# Patient Record
Sex: Male | Born: 1960 | Race: White | Hispanic: No | Marital: Single | Smoking: Former smoker
Health system: Southern US, Community
[De-identification: ages and names within clinical notes are randomized; demographics above are authoritative.]

## PROBLEM LIST (undated history)

## (undated) DIAGNOSIS — F419 Anxiety disorder, unspecified: Secondary | ICD-10-CM

## (undated) DIAGNOSIS — E119 Type 2 diabetes mellitus without complications: Secondary | ICD-10-CM

## (undated) DIAGNOSIS — Z5189 Encounter for other specified aftercare: Secondary | ICD-10-CM

## (undated) DIAGNOSIS — G4730 Sleep apnea, unspecified: Secondary | ICD-10-CM

## (undated) HISTORY — PX: CHOLECYSTECTOMY: SHX55

## (undated) HISTORY — PX: TONSILLECTOMY: SUR1361

## (undated) HISTORY — DX: Type 2 diabetes mellitus without complications: E11.9

## (undated) HISTORY — PX: TENDON REPAIR: SHX5111

## (undated) HISTORY — DX: Encounter for other specified aftercare: Z51.89

## (undated) HISTORY — PX: COLONOSCOPY: SHX174

---

## 2006-03-29 ENCOUNTER — Ambulatory Visit (HOSPITAL_COMMUNITY): Admission: RE | Admit: 2006-03-29 | Discharge: 2006-03-29 | Payer: Self-pay | Admitting: Family Medicine

## 2007-07-22 ENCOUNTER — Ambulatory Visit (HOSPITAL_COMMUNITY): Admission: RE | Admit: 2007-07-22 | Discharge: 2007-07-22 | Payer: Self-pay | Admitting: Family Medicine

## 2008-09-29 ENCOUNTER — Emergency Department (HOSPITAL_COMMUNITY): Admission: EM | Admit: 2008-09-29 | Discharge: 2008-09-29 | Payer: Self-pay | Admitting: Emergency Medicine

## 2008-10-09 ENCOUNTER — Encounter (INDEPENDENT_AMBULATORY_CARE_PROVIDER_SITE_OTHER): Payer: Self-pay | Admitting: General Surgery

## 2008-10-10 ENCOUNTER — Inpatient Hospital Stay (HOSPITAL_COMMUNITY): Admission: RE | Admit: 2008-10-10 | Discharge: 2008-10-11 | Payer: Self-pay | Admitting: General Surgery

## 2009-03-31 ENCOUNTER — Ambulatory Visit (HOSPITAL_COMMUNITY): Admission: RE | Admit: 2009-03-31 | Discharge: 2009-03-31 | Payer: Self-pay | Admitting: Family Medicine

## 2009-04-01 ENCOUNTER — Ambulatory Visit: Payer: Self-pay | Admitting: Internal Medicine

## 2009-04-04 DIAGNOSIS — R109 Unspecified abdominal pain: Secondary | ICD-10-CM | POA: Insufficient documentation

## 2009-04-04 DIAGNOSIS — K219 Gastro-esophageal reflux disease without esophagitis: Secondary | ICD-10-CM | POA: Insufficient documentation

## 2009-08-06 ENCOUNTER — Encounter (INDEPENDENT_AMBULATORY_CARE_PROVIDER_SITE_OTHER): Payer: Self-pay

## 2010-01-30 ENCOUNTER — Encounter: Payer: Self-pay | Admitting: Family Medicine

## 2010-02-08 NOTE — Letter (Signed)
Summary: Recall Office Visit  [facility]  [address]   [place], [place] [address]   Phone: [phone]  Fax: [phone]      August 06, 2009   Jimmy Mathis [address] [place], [place]  [address] [birth year]   Dear Mr. Andrepont,   According to our records, it is time for you to schedule a follow-up office visit with [place].   At your convenience, please call [phone] to schedule an office visit. If you have any questions, concerns, or feel that this letter is in error, we would appreciate your call.   Sincerely,    Hendricks Limes LPN  [facility] Ph: [phone]   Fax: [phone]

## 2010-02-08 NOTE — Assessment & Plan Note (Signed)
Summary: npp,abd pain,family hx colon ca. gu   Visit Type:  Initial Consult Primary Care Provider:  golding  Chief Complaint:  abd pain.  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 Symptoms began immediately following laparoscopic cholecystectomy for gangrenous cholecystitis back in October of 2010. He did have a Jackson-Pratt drain for period time and it was removed. He states the pain may "catch him" with time to time symptoms have a positional component;  he describes his pain primarily located to just below his rib cage. Not affected by eating or having a bowel movement. He tells me he's had normal bowel function denies constipation diarrhea he denies melena or hematochezia there's been no change in weight. He does not have any other GI symptoms such as odynapahagia, dysphagia, early satiety nausea or vomiting. He has not had any fever or chills.  He does have occasional reflux symptoms for which he takes omeprazole.  He does admit that his right-sided abdominal pain has  gradually improved considerably since the onset following cholecystectomy in October of last year. He feels at this point he should not be  having any  discomfort following cholecystectomy. He had a CT of the abdomen and pelvis done on March 23 which revealed benign appearing prostatic enlargement and a stable nonspecific  liver lesion but no other abnormalities.  Family history is significant for colon cancer in a maternal grandmother and 2 maternal uncles but at advanced advanced age.  Preventive Screening-Counseling & Management  Alcohol-Tobacco     Smoking Status: never  Current Medications (verified): 1)  Prilosec 20 Mg Cpdr (Omeprazole) .... Every Other Day 2)  Aspir-Low 81 Mg Tbec (Aspirin) .... Once Daily 3)  Multivitamins  Tabs (Multiple Vitamin) .... Once  Daily  Allergies (verified): 1)  ! Pcn 2)  ! Novocain  Past History:  Past Medical History: gerd  Past Surgical History: tendon repair in hand Cholecystectomy Tonsillectomy  Family History: Father: deceased Mother: alive Siblings: 1 sister Family History of Colon Cancer:maternal grandmother, 2 uncles on mothers side  Social History: Marital Status: no Children: no Occupation: Runner, broadcasting/film/video- [employer] Patient has never smoked.  Alcohol Use - yes occ Smoking Status:  never  Physical Exam  General:  or pleasant alert conversant gentleman in no acute distress Eyes:  no scleral icterus. Conjunctiva are pink Abdomen:  nondistended laparoscopy sites are well healed. Possibly a sounds soft very minimal vague tenderness to deep palpation right upper quadrant and right mid abdomen as well as right lower quadrant.  I do not appreciate any masses or hepatosplenomegaly  Impression & Recommendations:  Impression: Pleasant 50 year old gentleman with right-sided abdominal pain following laparoscopic cholecystectomy and  JP drain placement for gangrenous cholecystitis. The patient has made steady improvement since his gallbladder surgery. He admits that his symptoms are considerably improved at this time. Findings of abdominal pelvic CT scanning earlier this week are reassuring.  I suspect he may have had a little more abdominal wall injury during the laparoscopic procedure than is usually the case. I certainly do not detect any untoward consequences or complications related to his recent cholecystectomy.  He is approaching the threshold for colorectal cancer screening. He does have a history of colorectal cancer in  his family in only  second-degree relatives but at advanced age.  Recommendations: Reassurance that his right-sided abdominal pain is a postoperative phenomenon and is likely to continue to improve over  time without further evaluation. Since he is at the threshold age  for colorectal cancer screening, I will offer him a screening colonoscopy at this point in time. The patient would like to wait until he is officially turns 50 which will be this summer. I certainly do not feel this is unreasonable approach. However, I have asked him to return one immunofecal occult blood test sample so we can check his stool now. Of course, if that were to be positive, we will proceed with colonoscopy now.  I'd like to  thank Dr. Yetta Numbers for allowing me to see this nice gentleman.  Appended Document: Orders Update    Clinical Lists Changes  Problems: Added new problem of ABDOMINAL PAIN OTHER SPECIFIED SITE (ICD-789.09) Added new problem of GERD (ICD-530.81) Orders: Added new Service order of Consultation Level IV [phone]) - Signed

## 2010-04-14 LAB — DIFFERENTIAL
Lymphocytes Relative: 9 % — ABNORMAL LOW (ref 12–46)
Lymphs Abs: 1.4 10*3/uL (ref 0.7–4.0)
Monocytes Relative: 9 % (ref 3–12)
Neutro Abs: 11.8 10*3/uL — ABNORMAL HIGH (ref 1.7–7.7)

## 2010-04-14 LAB — CBC
MCV: 82.7 fL (ref 78.0–100.0)
RBC: 4.25 MIL/uL (ref 4.22–5.81)
RDW: 14 % (ref 11.5–15.5)
WBC: 14.5 10*3/uL — ABNORMAL HIGH (ref 4.0–10.5)

## 2010-04-14 LAB — HEPATIC FUNCTION PANEL
Alkaline Phosphatase: 72 U/L (ref 39–117)
Bilirubin, Direct: 0.2 mg/dL (ref 0.0–0.3)
Total Bilirubin: 0.8 mg/dL (ref 0.3–1.2)

## 2010-04-15 LAB — HEPATIC FUNCTION PANEL
AST: 23 U/L (ref 0–37)
Alkaline Phosphatase: 103 U/L (ref 39–117)
Bilirubin, Direct: 0.1 mg/dL (ref 0.0–0.3)
Total Bilirubin: 0.7 mg/dL (ref 0.3–1.2)
Total Protein: 7.3 g/dL (ref 6.0–8.3)

## 2010-04-15 LAB — URINALYSIS, ROUTINE W REFLEX MICROSCOPIC
Bilirubin Urine: NEGATIVE
Glucose, UA: NEGATIVE mg/dL
Ketones, ur: NEGATIVE mg/dL
Nitrite: NEGATIVE
Urobilinogen, UA: 0.2 mg/dL (ref 0.0–1.0)
pH: 8.5 — ABNORMAL HIGH (ref 5.0–8.0)

## 2010-04-15 LAB — BASIC METABOLIC PANEL
BUN: 10 mg/dL (ref 6–23)
Calcium: 10.1 mg/dL (ref 8.4–10.5)
Chloride: 105 mEq/L (ref 96–112)
Creatinine, Ser: 0.89 mg/dL (ref 0.4–1.5)
GFR calc Af Amer: >60 mL/min (ref >60)
GFR calc non Af Amer: >60 mL/min (ref >60)

## 2010-04-15 LAB — DIFFERENTIAL
Eosinophils Absolute: 0 10*3/uL (ref 0.0–0.7)
Lymphocytes Relative: 12 % (ref 12–46)
Neutro Abs: 6.9 10*3/uL (ref 1.7–7.7)

## 2010-04-15 LAB — CBC
MCHC: 35 g/dL (ref 30.0–36.0)
MCV: 81.5 fL (ref 78.0–100.0)
RBC: 5.43 MIL/uL (ref 4.22–5.81)
RDW: 13.8 % (ref 11.5–15.5)
WBC: 8.3 10*3/uL (ref 4.0–10.5)

## 2010-04-15 LAB — LIPASE, BLOOD: Lipase: 26 U/L (ref 11–59)

## 2010-06-13 ENCOUNTER — Telehealth: Payer: Self-pay

## 2010-06-13 DIAGNOSIS — Z800 Family history of malignant neoplasm of digestive organs: Secondary | ICD-10-CM

## 2010-06-13 DIAGNOSIS — Z139 Encounter for screening, unspecified: Secondary | ICD-10-CM

## 2010-06-13 NOTE — Telephone Encounter (Signed)
OK for colonscopy 

## 2010-06-13 NOTE — Telephone Encounter (Signed)
  Gastroenterology Pre-Procedure Form Request Date: 06/13/2010,  ( Pt called to schedule his own... PCP is Dr. Phillips Odor    PATIENT INFORMATION:  Jimmy Mathis is a 50 y.o., male (DOB=10-28-1960).  PROCEDURE: Procedure(s) requested: colonoscopy Procedure Reason: screening for colon cancer and family hx of colon cancer  PATIENT REVIEW QUESTIONS: The patient reports the following:   1. Diabetes Melitis: no 2. Joint replacements in the past 12 months: no 3. Major health problems in the past 3 months: no 4. Has an artificial valve or MVP:no 5. Has been advised in past to take antibiotics in advance of a procedure like teeth cleaning: no}    MEDICATIONS & ALLERGIES:    Patient reports the following regarding taking any blood thinners:   Plavix? No Aspirin? Yes  Coumadin?  no  Patient confirms/reports the following medications:  Current Outpatient Prescriptions  Medication Sig Dispense Refill  . aspirin 81 MG tablet Take 81 mg by mouth daily.        . Multiple Vitamin (MULTIVITAMIN) capsule Take 1 capsule by mouth daily.          Patient confirms/reports the following allergies:  Allergies  Allergen Reactions  . Penicillins Swelling  . Procaine Hcl Swelling    Patient is appropriate to schedule for requested procedure(s): yes  AUTHORIZATION INFORMATION Primary Insurance Pre-Cert / Auth required: Pre-Cert / Auth #:  Secondary Insurance: Pre-Cert / Auth required: Pre-Cert / Auth #:  Orders Placed This Encounter  Procedures  . Endoscopy, colon, diagnostic    Standing Status: Future     Number of Occurrences:      Standing Expiration Date: 06/13/2011    Order Specific Question:  Pre-op diagnosis    Answer:  Screening colonoscopy    Order Specific Question:  Pre-op visit required?    Answer:  No [0]    SCHEDULE INFORMATION: Procedure has been scheduled as follows:  Date: 06/20/2010, Time: 10:00 AM  Location: [facility] Short Stay  This Gastroenterology  Pre-Precedure Form is being routed to the following provider(s) for review: R. Roetta Sessions, MD

## 2010-06-14 NOTE — Telephone Encounter (Signed)
Rx and instructions faxed to [facility], pt aware.

## 2010-06-20 ENCOUNTER — Ambulatory Visit (HOSPITAL_COMMUNITY)
Admission: RE | Admit: 2010-06-20 | Discharge: 2010-06-20 | Disposition: A | Discharge status: Home / Self Care | Payer: BC Managed Care – PPO | Source: Ambulatory Visit | Attending: Internal Medicine | Admitting: Internal Medicine

## 2010-06-20 ENCOUNTER — Encounter: Payer: BC Managed Care – PPO | Admitting: Internal Medicine

## 2010-06-20 DIAGNOSIS — Z1211 Encounter for screening for malignant neoplasm of colon: Secondary | ICD-10-CM

## 2010-07-25 NOTE — Op Note (Signed)
  NAME:  Jimmy Mathis, Jimmy Mathis               ACCOUNT NO.:  [id number]  MEDICAL RECORD NO.:  [id number]  LOCATION:  [facility]                          FACILITY:  [facility]  PHYSICIAN:  R. Roetta Sessions, MD FACP FACGDATE OF BIRTH:  20-Sep-1960  DATE OF PROCEDURE: DATE OF DISCHARGE:                              OPERATIVE REPORT   PROCEDURE:  Screening ileocolonoscopy.  INDICATIONS FOR PROCEDURE:  A 50 year old gentleman, comes for his first ever screening colonoscopy.  He is devoid of any lower GI tract symptoms.  No family history of colon cancer in any first-degree relatives, although he has couple of second-degree relative colon cancer, but at advanced age.  Colonoscopy is now being done as standard screening maneuver.  Risks, benefits, limitations, alternatives, imponderables have been discussed, questions answered.  Please see the documentation in the medical record for more information.  PROCEDURE NOTE:  O2 saturation, blood pressure, pulse, respirations were monitored throughout the entire procedure.  CONSCIOUS SEDATION:  Versed 5 mg IV, Demerol 100 mg IV in divided doses.  INSTRUMENT:  Pentax video chip system.  FINDINGS:  Digital rectal exam revealed no abnormalities.  Endoscopic findings:  Prep was good.  Colon:  Colonic mucosa was surveyed from the rectosigmoid junction through the left transverse right colon to the appendiceal orifice, ileocecal valve/cecum.  These structures were well seen and photographed for the record.  Terminal ileum was intubated to 10 cm.  From this level, the scope was slowly and cautiously withdrawn. All previously mentioned mucosal surfaces were again seen.  The colonic mucosa as well as the terminal mucosa appeared entirely normal.  Scope was pulled down to the rectum where a thorough examination of rectal mucosa including retroflexed view of the anal verge demonstrated no abnormalities.  The patient tolerated the procedure well.  Cecal withdrawal time 8  minutes.  IMPRESSION:  Normal rectum, colon, and terminal ileum.  RECOMMENDATIONS:  Repeat screening colonoscopy in 10 years.     Jonathon Bellows, MD [facility]     RMR/MEDQ  D:  06/20/2010  T:  06/21/2010  Job:  [id number]  cc:   Dr. Phillips Odor  Electronically Signed by Lorrin Goodell M.D. on 07/25/2010 08:39:48 AM

## 2012-05-22 ENCOUNTER — Emergency Department (HOSPITAL_COMMUNITY): Payer: BC Managed Care – PPO

## 2012-05-22 ENCOUNTER — Emergency Department (HOSPITAL_COMMUNITY)
Admission: EM | Admit: 2012-05-22 | Discharge: 2012-05-22 | Disposition: A | Discharge status: Home / Self Care | Payer: BC Managed Care – PPO | Attending: Emergency Medicine | Admitting: Emergency Medicine

## 2012-05-22 ENCOUNTER — Encounter (HOSPITAL_COMMUNITY): Payer: Self-pay | Admitting: Emergency Medicine

## 2012-05-22 DIAGNOSIS — R079 Chest pain, unspecified: Secondary | ICD-10-CM | POA: Insufficient documentation

## 2012-05-22 DIAGNOSIS — E781 Pure hyperglyceridemia: Secondary | ICD-10-CM | POA: Insufficient documentation

## 2012-05-22 DIAGNOSIS — Z7982 Long term (current) use of aspirin: Secondary | ICD-10-CM | POA: Insufficient documentation

## 2012-05-22 DIAGNOSIS — F411 Generalized anxiety disorder: Secondary | ICD-10-CM | POA: Insufficient documentation

## 2012-05-22 DIAGNOSIS — Z880 Allergy status to penicillin: Secondary | ICD-10-CM | POA: Insufficient documentation

## 2012-05-22 DIAGNOSIS — Z79899 Other long term (current) drug therapy: Secondary | ICD-10-CM | POA: Insufficient documentation

## 2012-05-22 DIAGNOSIS — R42 Dizziness and giddiness: Secondary | ICD-10-CM | POA: Insufficient documentation

## 2012-05-22 LAB — CBC WITH DIFFERENTIAL/PLATELET
Basophils Absolute: 0 10*3/uL (ref 0.0–0.1)
Basophils Relative: 1 % (ref 0–1)
Eosinophils Absolute: 0 10*3/uL (ref 0.0–0.7)
HCT: 44.2 % (ref 39.0–52.0)
MCHC: 36.7 g/dL — ABNORMAL HIGH (ref 30.0–36.0)
Monocytes Absolute: 0.7 10*3/uL (ref 0.1–1.0)
Monocytes Relative: 9 % (ref 3–12)
Neutro Abs: 5.6 10*3/uL (ref 1.7–7.7)
Platelets: 187 10*3/uL (ref 150–400)
RBC: 5.65 MIL/uL (ref 4.22–5.81)

## 2012-05-22 LAB — BASIC METABOLIC PANEL
BUN: 10 mg/dL (ref 6–23)
Calcium: 9.5 mg/dL (ref 8.4–10.5)
Glucose, Bld: 146 mg/dL — ABNORMAL HIGH (ref 70–99)
Potassium: 4.6 mEq/L (ref 3.5–5.1)

## 2012-05-22 LAB — TROPONIN I: Troponin I: <0.3 ng/mL (ref <0.30)

## 2012-05-22 LAB — HEPATIC FUNCTION PANEL
Albumin: 3.9 g/dL (ref 3.5–5.2)
Total Protein: 6.9 g/dL (ref 6.0–8.3)

## 2012-05-22 MED ORDER — PANTOPRAZOLE SODIUM 40 MG PO TBEC
40.0000 mg | DELAYED_RELEASE_TABLET | Freq: Every day | ORAL | Status: DC
  Administered 2012-05-22: 40 mg via ORAL
  Filled 2012-05-22: qty 1

## 2012-05-22 MED ORDER — FAMOTIDINE 20 MG PO TABS
20.0000 mg | ORAL_TABLET | Freq: Once | ORAL | Status: AC
  Administered 2012-05-22: 20 mg via ORAL
  Filled 2012-05-22: qty 1

## 2012-05-22 MED ORDER — RANITIDINE HCL 150 MG PO TABS: 150.0000 mg | ORAL_TABLET | Freq: Two times a day (BID) | ORAL | Status: DC

## 2012-05-22 MED ORDER — OMEPRAZOLE 20 MG PO CPDR: 20.0000 mg | DELAYED_RELEASE_CAPSULE | Freq: Every day | ORAL | Status: DC

## 2012-05-22 NOTE — ED Notes (Signed)
Pt reports chest pain that started around 5am. Pt alert and oriented. Pt has received 324 mg baby aspirin en route. No diaphoresis noted. nad noted.

## 2012-05-22 NOTE — ED Notes (Signed)
Pt urine placed in blue bin at nurses station.

## 2012-05-22 NOTE — ED Provider Notes (Signed)
History     CSN: [id number]  Arrival date & time 05/22/12  4098   First MD Initiated Contact with Patient 05/22/12 [phone]      Chief Complaint  Patient presents with  . Chest Pain    (Consider location/radiation/quality/duration/timing/severity/associated sxs/prior treatment) Patient is a 52 y.o. male presenting with chest pain. The history is provided by the patient.  Chest Pain Pain location:  Substernal area Pain quality: pressure   Pain radiates to:  Does not radiate Pain radiates to the back: no   Pain severity:  Moderate Onset quality:  Gradual Duration:  3 hours Timing:  Constant Progression:  Improving Chronicity:  Recurrent Context: not breathing and not lifting   Context comment:  Dizziness Relieved by:  Antacids Exacerbated by: food. Ineffective treatments:  None tried Associated symptoms: anxiety and dizziness   Associated symptoms: no abdominal pain, no back pain, no cough, no nausea, no near-syncope, no palpitations and no shortness of breath   Risk factors: male sex   Risk factors: no coronary artery disease, no diabetes mellitus, no high cholesterol, no hypertension, no prior DVT/PE, no smoking and no surgery   Risk factors comment:  Triglycerides elevated.   History reviewed. No pertinent past medical history.  Past Surgical History  Procedure Laterality Date  . Tonsillectomy    . Cholecystectomy    . Tendon repair      right hand    History reviewed. No pertinent family history.  History  Substance Use Topics  . Smoking status: Never Smoker   . Smokeless tobacco: Not on file  . Alcohol Use: No     Comment: occasionally      Review of Systems  Constitutional: Negative for activity change.       All ROS Neg except as noted in HPI  HENT: Negative for nosebleeds and neck pain.   Eyes: Negative for photophobia and discharge.  Respiratory: Negative for cough, shortness of breath and wheezing.   Cardiovascular: Positive for chest pain.  Negative for palpitations and near-syncope.  Gastrointestinal: Negative for nausea, abdominal pain and blood in stool.  Genitourinary: Negative for dysuria, frequency and hematuria.  Musculoskeletal: Negative for back pain and arthralgias.  Skin: Negative.   Neurological: Positive for dizziness. Negative for seizures and speech difficulty.  Psychiatric/Behavioral: Negative for hallucinations and confusion.    Allergies  Penicillins and Procaine hcl  Home Medications   Current Outpatient Rx  Name  Route  Sig  Dispense  Refill  . aspirin 81 MG tablet   Oral   Take 81 mg by mouth daily.           Marland Kitchen ibuprofen (ADVIL,MOTRIN) 200 MG tablet   Oral   Take 400 mg by mouth every 6 (six) hours as needed for pain or headache.         . Multiple Vitamin (MULTIVITAMIN) capsule   Oral   Take 1 capsule by mouth daily.             There were no vitals taken for this visit.  Physical Exam  Nursing note and vitals reviewed. Constitutional: He is oriented to person, place, and time. He appears well-developed and well-nourished.  Non-toxic appearance.  HENT:  Head: Normocephalic.  Right Ear: Tympanic membrane and external ear normal.  Left Ear: Tympanic membrane and external ear normal.  Eyes: EOM and lids are normal. Pupils are equal, round, and reactive to light.  Neck: Normal range of motion. Neck supple. Carotid bruit is not present.  Cardiovascular:  Normal rate, regular rhythm, normal heart sounds, intact distal pulses and normal pulses.   Pulmonary/Chest: Breath sounds normal. No respiratory distress.  Abdominal: Soft. Bowel sounds are normal. There is no tenderness. There is no guarding.  Musculoskeletal: Normal range of motion.  Lymphadenopathy:       Head (right side): No submandibular adenopathy present.       Head (left side): No submandibular adenopathy present.    He has no cervical adenopathy.  Neurological: He is alert and oriented to person, place, and time. He has  normal strength. No cranial nerve deficit or sensory deficit.  Skin: Skin is warm and dry.  Psychiatric: He has a normal mood and affect. His speech is normal.    ED Course  Procedures (including critical care time)  Labs Reviewed  GLUCOSE, CAPILLARY - Abnormal; Notable for the following:    Glucose-Capillary 160 (*)    All other components within normal limits  CBC WITH DIFFERENTIAL  BASIC METABOLIC PANEL  TROPONIN I   No results found.  Date: 05/22/2012  Rate: 90  Rhythm: normal sinus rhythm  QRS Axis: normal  Intervals: normal  ST/T Wave abnormalities: normal  Conduction Disutrbances:none  Narrative Interpretation: No STEMI  Old EKG Reviewed: Improved from  Sept 29,2010.   No diagnosis found.    MDM  I have reviewed nursing notes, vital signs, and all appropriate lab and imaging results for this patient. Pt noted a substernal area pressure type pain that has been constant for nearly 3 or more hours. Pt became dizzy and somewhat pale (according to co-workers) and was sent to ED.  EKG reveals NSR with no STEMI. Troponin normal at <0.30, Lipase wnl. CBC and Bmet wnl except for glucose 146. Chest xray is non-acute.  Pt observed on the monitor with no acute rhythm changes or problem. 2nd Troponin wnl. Pt advised of ED work up findings. Advised to use zantac and prilosec. He is to see his PCP tomorrow for completion of cardiac work up. He will return to the ED if any changes or problem.    Kathie Dike, PA-C 05/24/12 [phone]

## 2012-05-22 NOTE — ED Notes (Signed)
DG chest 2 view ordered in error. Could not  d/c order in order management. Order clicked off in error. Order changed to portable 1 view per PA verbal order.

## 2012-05-25 NOTE — ED Provider Notes (Signed)
Medical screening examination/treatment/procedure(s) were performed by non-physician practitioner and as supervising physician I was immediately available for consultation/collaboration.   Laray Anger, DO 05/25/12 1054

## 2012-09-21 ENCOUNTER — Emergency Department (HOSPITAL_COMMUNITY): Payer: BC Managed Care – PPO

## 2012-09-21 ENCOUNTER — Emergency Department (HOSPITAL_COMMUNITY)
Admission: EM | Admit: 2012-09-21 | Discharge: 2012-09-21 | Disposition: A | Discharge status: Home / Self Care | Payer: BC Managed Care – PPO | Attending: Emergency Medicine | Admitting: Emergency Medicine

## 2012-09-21 ENCOUNTER — Encounter (HOSPITAL_COMMUNITY): Payer: Self-pay

## 2012-09-21 DIAGNOSIS — Z880 Allergy status to penicillin: Secondary | ICD-10-CM | POA: Insufficient documentation

## 2012-09-21 DIAGNOSIS — R0989 Other specified symptoms and signs involving the circulatory and respiratory systems: Secondary | ICD-10-CM | POA: Insufficient documentation

## 2012-09-21 DIAGNOSIS — I493 Ventricular premature depolarization: Secondary | ICD-10-CM

## 2012-09-21 DIAGNOSIS — Z8639 Personal history of other endocrine, nutritional and metabolic disease: Secondary | ICD-10-CM | POA: Insufficient documentation

## 2012-09-21 DIAGNOSIS — Z862 Personal history of diseases of the blood and blood-forming organs and certain disorders involving the immune mechanism: Secondary | ICD-10-CM | POA: Insufficient documentation

## 2012-09-21 DIAGNOSIS — Z79899 Other long term (current) drug therapy: Secondary | ICD-10-CM | POA: Insufficient documentation

## 2012-09-21 DIAGNOSIS — R079 Chest pain, unspecified: Secondary | ICD-10-CM | POA: Insufficient documentation

## 2012-09-21 DIAGNOSIS — I491 Atrial premature depolarization: Secondary | ICD-10-CM | POA: Insufficient documentation

## 2012-09-21 DIAGNOSIS — R0609 Other forms of dyspnea: Secondary | ICD-10-CM | POA: Insufficient documentation

## 2012-09-21 DIAGNOSIS — Z7982 Long term (current) use of aspirin: Secondary | ICD-10-CM | POA: Insufficient documentation

## 2012-09-21 LAB — BASIC METABOLIC PANEL
BUN: 8 mg/dL (ref 6–23)
CO2: 26 mEq/L (ref 19–32)
Chloride: 104 mEq/L (ref 96–112)
Creatinine, Ser: 0.98 mg/dL (ref 0.50–1.35)

## 2012-09-21 LAB — CBC WITH DIFFERENTIAL/PLATELET
Basophils Absolute: 0 10*3/uL (ref 0.0–0.1)
HCT: 43.5 % (ref 39.0–52.0)
Hemoglobin: 15.3 g/dL (ref 13.0–17.0)
Lymphocytes Relative: 23 % (ref 12–46)
Lymphs Abs: 1.4 10*3/uL (ref 0.7–4.0)
Monocytes Absolute: 0.6 10*3/uL (ref 0.1–1.0)
Monocytes Relative: 10 % (ref 3–12)
Neutro Abs: 4 10*3/uL (ref 1.7–7.7)
WBC: 6.2 10*3/uL (ref 4.0–10.5)

## 2012-09-21 LAB — TROPONIN I: Troponin I: <0.3 ng/mL (ref <0.30)

## 2012-09-21 MED ORDER — NITROGLYCERIN 0.4 MG SL SUBL: 0.4000 mg | SUBLINGUAL_TABLET | SUBLINGUAL | Status: DC | PRN

## 2012-09-21 MED ORDER — NITROGLYCERIN 0.4 MG SL SUBL
0.4000 mg | SUBLINGUAL_TABLET | SUBLINGUAL | Status: DC | PRN
  Administered 2012-09-21 (×2): 0.4 mg via SUBLINGUAL
  Filled 2012-09-21: qty 25

## 2012-09-21 MED ORDER — ASPIRIN 81 MG PO CHEW
324.0000 mg | CHEWABLE_TABLET | Freq: Once | ORAL | Status: AC
  Administered 2012-09-21: 324 mg via ORAL
  Filled 2012-09-21: qty 4

## 2012-09-21 NOTE — ED Notes (Signed)
Pt awoke approx 4 am to go to the bathroom and felt a "thumping" in his chest. Pt denies pain or discomfort in chest but states his back feels tight

## 2012-09-21 NOTE — ED Provider Notes (Signed)
CSN: [id number]     Arrival date & time 09/21/12  1191 History   First MD Initiated Contact with Patient 09/21/12 [phone]     Chief Complaint  Patient presents with  . Palpitations   (Consider location/radiation/quality/duration/timing/severity/associated sxs/prior Treatment) Patient is a 52 y.o. male presenting with palpitations. The history is provided by the patient.  Palpitations He had onset at about 3:45 AM of palpitations like his heart was racing and thumping in his chest. There was some associated tightness in his chest which has resolved but there is some residual tightness in his back. Tightness is moderate and he rated at 6/10 at its worst and is down to 3/10. There is some dyspnea but no nausea, vomiting, diaphoresis. Nothing made it better nothing made it worse. He does have a history of one episode of paroxysmal atrial fibrillation which occurred about 20 years ago. His cardiac risk factors include hypertriglyceridemia, and positive family history with his father having heart disease with onset in his mid 67s. He didn't takes aspirin 81 mg daily. He denies history of hypertension, diabetes and he is a nonsmoker. Of note, he had been seen in the ED for months ago for an episode of chest pain but had not followed up with his PCP. He does have an appointment with his PCP next month.  History reviewed. No pertinent past medical history. Past Surgical History  Procedure Laterality Date  . Tonsillectomy    . Cholecystectomy    . Tendon repair      right hand   No family history on file. History  Substance Use Topics  . Smoking status: Never Smoker   . Smokeless tobacco: Not on file  . Alcohol Use: Yes     Comment: occasionally    Review of Systems  Cardiovascular: Positive for palpitations.  All other systems reviewed and are negative.    Allergies  Penicillins and Procaine hcl  Home Medications   Current Outpatient Rx  Name  Route  Sig  Dispense  Refill  . aspirin 81  MG tablet   Oral   Take 81 mg by mouth daily.           Marland Kitchen ibuprofen (ADVIL,MOTRIN) 200 MG tablet   Oral   Take 400 mg by mouth every 6 (six) hours as needed for pain or headache.         . Multiple Vitamin (MULTIVITAMIN) capsule   Oral   Take 1 capsule by mouth daily.           Marland Kitchen omeprazole (PRILOSEC) 20 MG capsule   Oral   Take 1 capsule (20 mg total) by mouth daily.   30 capsule   0   . ranitidine (ZANTAC) 150 MG tablet   Oral   Take 1 tablet (150 mg total) by mouth 2 (two) times daily.   60 tablet   0    BP 154/75  Pulse 101  Temp(Src) 98.4 F (36.9 C) (Oral)  Resp 20  Ht 5\' 10"  (1.778 m)  Wt 235 lb (106.595 kg)  BMI 33.72 kg/m2  SpO2 98% Physical Exam  Nursing note and vitals reviewed.  52 year old male, resting comfortably and in no acute distress. Vital signs are significant for hypertension with blood pressure 154/75, and tachycardia with heart rate 101. Oxygen saturation is 98%, which is normal. Head is normocephalic and atraumatic. PERRLA, EOMI. Oropharynx is clear. Neck is nontender and supple without adenopathy or JVD. Back is nontender and there is no CVA  tenderness. Lungs are clear without rales, wheezes, or rhonchi. Chest is nontender. Heart has regular rate and rhythm without murmur. Abdomen is soft, flat, nontender without masses or hepatosplenomegaly and peristalsis is normoactive. Extremities have trace edema, full range of motion is present. Skin is warm and dry without rash. Neurologic: Mental status is normal, cranial nerves are intact, there are no motor or sensory deficits.  ED Course  Procedures (including critical care time) Labs Review Results for orders placed during the hospital encounter of 09/21/12  CBC WITH DIFFERENTIAL      Result Value Range   WBC 6.2  4.0 - 10.5 K/uL   RBC 5.33  4.22 - 5.81 MIL/uL   Hemoglobin 15.3  13.0 - 17.0 g/dL   HCT 16.1  09.6 - 04.5 %   MCV 81.6  78.0 - 100.0 fL   MCH 28.7  26.0 - 34.0 pg    MCHC 35.2  30.0 - 36.0 g/dL   RDW 40.9  81.1 - 91.4 %   Platelets 171  150 - 400 K/uL   Neutrophils Relative % 65  43 - 77 %   Neutro Abs 4.0  1.7 - 7.7 K/uL   Lymphocytes Relative 23  12 - 46 %   Lymphs Abs 1.4  0.7 - 4.0 K/uL   Monocytes Relative 10  3 - 12 %   Monocytes Absolute 0.6  0.1 - 1.0 K/uL   Eosinophils Relative 2  0 - 5 %   Eosinophils Absolute 0.1  0.0 - 0.7 K/uL   Basophils Relative 0  0 - 1 %   Basophils Absolute 0.0  0.0 - 0.1 K/uL  BASIC METABOLIC PANEL      Result Value Range   Sodium 139  135 - 145 mEq/L   Potassium 3.6  3.5 - 5.1 mEq/L   Chloride 104  96 - 112 mEq/L   CO2 26  19 - 32 mEq/L   Glucose, Bld 160 (*) 70 - 99 mg/dL   BUN 8  6 - 23 mg/dL   Creatinine, Ser 7.82  0.50 - 1.35 mg/dL   Calcium 95.6  8.4 - 21.3 mg/dL   GFR calc non Af Amer >90  >90 mL/min   GFR calc Af Amer >90  >90 mL/min  TROPONIN I      Result Value Range   Troponin I <0.30  <0.30 ng/mL   Imaging Review Dg Chest Port 1 View  09/21/2012   *RADIOLOGY REPORT*  Clinical Data: Chest pain  PORTABLE CHEST - 1 VIEW  Comparison: Prior radiograph from 05/22/2012  Findings: Cardiac and mediastinal silhouettes are stable in size and contour.  The lung volumes are within normal limits.  Mild elevation right hemidiaphragm is unchanged.  No airspace consolidation, pleural effusion, or pulmonary edema is identified.  There is no pneumothorax.  No acute osseous abnormality identified.  IMPRESSION: No acute cardiopulmonary process.   Original Report Authenticated By: Rise Mu, M.D.     Date: 09/21/2012  Rate: 108  Rhythm: sinus tachycardia and premature ventricular contractions (PVC)  QRS Axis: normal  Intervals: normal  ST/T Wave abnormalities: normal  Conduction Disutrbances:Incomplete right branch block  Narrative Interpretation: Sinus tachycardia, incomplete right bundle-branch block, frequent PVCs. When compared with ECG of May 22 2012, PVCs are now present but no other changes are  seen.  Old EKG Reviewed: unchanged   MDM   1. Chest pain   2. PVC's (premature ventricular contractions)    Episode of palpitations and chest tightness in  patient with modest risk factors for coronary artery disease. He is given aspirin and nitroglycerin and laboratory workup initiated. He clearly needs some kind of provocative stress testing. Old records are reviewed and he does have an ED visit for chest pain.  6:08 AM He had good relief of his tightness with nitroglycerin but continues to feel palpitations. He continues to have frequent PVCs on monitor. Patient is very worried about this. He will be kept in the ED to get followup troponin.  7:43 AM He continues to be free of the chest tightness. PVCs continue on the monitor and is still aware of them. Repeat troponin is pending. If this is negative, he will be referred to cardiology for outpatient stress testing and discharged with a prescription for nitroglycerin.  Dione Booze, MD 09/21/12 [phone]

## 2012-09-21 NOTE — ED Notes (Signed)
Pt will have enzymes rechecked 3 hours after receiving last nitro.

## 2012-10-22 ENCOUNTER — Emergency Department (HOSPITAL_COMMUNITY)
Admission: EM | Admit: 2012-10-22 | Discharge: 2012-10-22 | Disposition: A | Discharge status: Home / Self Care | Payer: BC Managed Care – PPO | Attending: Emergency Medicine | Admitting: Emergency Medicine

## 2012-10-22 ENCOUNTER — Ambulatory Visit (HOSPITAL_COMMUNITY)
Admission: RE | Admit: 2012-10-22 | Discharge: 2012-10-22 | Disposition: A | Discharge status: Home / Self Care | Payer: BC Managed Care – PPO | Source: Ambulatory Visit | Attending: Family Medicine | Admitting: Family Medicine

## 2012-10-22 ENCOUNTER — Other Ambulatory Visit (HOSPITAL_COMMUNITY): Payer: Self-pay | Admitting: Family Medicine

## 2012-10-22 ENCOUNTER — Encounter (HOSPITAL_COMMUNITY): Payer: Self-pay | Admitting: Emergency Medicine

## 2012-10-22 ENCOUNTER — Emergency Department (HOSPITAL_COMMUNITY): Payer: BC Managed Care – PPO

## 2012-10-22 DIAGNOSIS — J8410 Pulmonary fibrosis, unspecified: Secondary | ICD-10-CM | POA: Insufficient documentation

## 2012-10-22 DIAGNOSIS — J690 Pneumonitis due to inhalation of food and vomit: Secondary | ICD-10-CM | POA: Insufficient documentation

## 2012-10-22 DIAGNOSIS — R059 Cough, unspecified: Secondary | ICD-10-CM | POA: Insufficient documentation

## 2012-10-22 DIAGNOSIS — J189 Pneumonia, unspecified organism: Secondary | ICD-10-CM

## 2012-10-22 DIAGNOSIS — Z880 Allergy status to penicillin: Secondary | ICD-10-CM | POA: Insufficient documentation

## 2012-10-22 DIAGNOSIS — Z0000 Encounter for general adult medical examination without abnormal findings: Secondary | ICD-10-CM

## 2012-10-22 DIAGNOSIS — Z7982 Long term (current) use of aspirin: Secondary | ICD-10-CM | POA: Insufficient documentation

## 2012-10-22 DIAGNOSIS — R05 Cough: Secondary | ICD-10-CM | POA: Insufficient documentation

## 2012-10-22 DIAGNOSIS — R509 Fever, unspecified: Secondary | ICD-10-CM | POA: Insufficient documentation

## 2012-10-22 DIAGNOSIS — R918 Other nonspecific abnormal finding of lung field: Secondary | ICD-10-CM | POA: Insufficient documentation

## 2012-10-22 LAB — BASIC METABOLIC PANEL
BUN: 10 mg/dL (ref 6–23)
CO2: 25 mEq/L (ref 19–32)
Chloride: 99 mEq/L (ref 96–112)
GFR calc non Af Amer: >90 mL/min (ref >90)
Glucose, Bld: 114 mg/dL — ABNORMAL HIGH (ref 70–99)
Potassium: 3.8 mEq/L (ref 3.5–5.1)

## 2012-10-22 LAB — CBC WITH DIFFERENTIAL/PLATELET
Eosinophils Absolute: 0 10*3/uL (ref 0.0–0.7)
Hemoglobin: 15.5 g/dL (ref 13.0–17.0)
Lymphs Abs: 1.3 10*3/uL (ref 0.7–4.0)
MCH: 28.7 pg (ref 26.0–34.0)
Monocytes Relative: 9 % (ref 3–12)
Neutrophils Relative %: 82 % — ABNORMAL HIGH (ref 43–77)
RBC: 5.41 MIL/uL (ref 4.22–5.81)

## 2012-10-22 MED ORDER — AZITHROMYCIN 250 MG PO TABS: 250.0000 mg | ORAL_TABLET | Freq: Every day | ORAL | Status: DC

## 2012-10-22 MED ORDER — AZITHROMYCIN 250 MG PO TABS
500.0000 mg | ORAL_TABLET | Freq: Once | ORAL | Status: AC
  Administered 2012-10-22: 500 mg via ORAL
  Filled 2012-10-22: qty 2

## 2012-10-22 MED ORDER — IOHEXOL 300 MG/ML  SOLN
80.0000 mL | Freq: Once | INTRAMUSCULAR | Status: AC | PRN
  Administered 2012-10-22: 80 mL via INTRAVENOUS

## 2012-10-22 NOTE — ED Notes (Signed)
Allowed pt to ventilate and ask questions before leaving. Pt will f/u w/PMD tomorrow.

## 2012-10-22 NOTE — ED Notes (Signed)
Cough for  2 mos, Had a chest x-ray. Today, that pt says was normal.  Coughed up a fb  That looks like a popcorn kernal. Today.

## 2012-10-22 NOTE — ED Provider Notes (Signed)
CSN: [id number]     Arrival date & time 10/22/12  1744 History   First MD Initiated Contact with Patient 10/22/12 1958     Chief Complaint  Patient presents with  . Cough   (Consider location/radiation/quality/duration/timing/severity/associated sxs/prior Treatment) Patient is a 52 y.o. male presenting with cough.  Cough  Pt with no significant PMH reports he has had dry cough for the past 2 months, worse at night, associated with subjective fevers at night as well. Not associated with CP or SOB. He went to see his PCP today and had CXR done this AM which he was told was 'normal'. Later this afternoon, he coughed up what appears to be an unpopped kernel of popcorn (he brought it with him). He has since begun to cough up a large amount of yellow sputum.   History reviewed. No pertinent past medical history. Past Surgical History  Procedure Laterality Date  . Tonsillectomy    . Cholecystectomy    . Tendon repair      right hand   History reviewed. No pertinent family history. History  Substance Use Topics  . Smoking status: Never Smoker   . Smokeless tobacco: Not on file  . Alcohol Use: Yes     Comment: occasionally    Review of Systems  Respiratory: Positive for cough.    All other systems reviewed and are negative except as noted in HPI.   Allergies  Penicillins; Procaine hcl; and Levaquin  Home Medications   Current Outpatient Rx  Name  Route  Sig  Dispense  Refill  . acetaminophen (TYLENOL) 500 MG tablet   Oral   Take 500 mg by mouth every 6 (six) hours as needed for pain.         Marland Kitchen aspirin 81 MG tablet   Oral   Take 81 mg by mouth daily.           . Multiple Vitamin (MULTIVITAMIN) capsule   Oral   Take 1 capsule by mouth daily.           . nitroGLYCERIN (NITROSTAT) 0.4 MG SL tablet   Sublingual   Place 1 tablet (0.4 mg total) under the tongue every 5 (five) minutes as needed for chest pain.   30 tablet   0    BP 145/76  Pulse 126  Temp(Src)  99.8 F (37.7 C) (Oral)  Resp 20  Ht 5\' 10"  (1.778 m)  Wt 233 lb (105.688 kg)  BMI 33.43 kg/m2  SpO2 98% Physical Exam  Nursing note and vitals reviewed. Constitutional: He is oriented to person, place, and time. He appears well-developed and well-nourished.  HENT:  Head: Normocephalic and atraumatic.  Eyes: EOM are normal. Pupils are equal, round, and reactive to light.  Neck: Normal range of motion. Neck supple.  Cardiovascular: Normal rate, normal heart sounds and intact distal pulses.   Pulmonary/Chest: Effort normal and breath sounds normal.  Abdominal: Bowel sounds are normal. He exhibits no distension. There is no tenderness.  Musculoskeletal: Normal range of motion. He exhibits no edema and no tenderness.  Neurological: He is alert and oriented to person, place, and time. He has normal strength. No cranial nerve deficit or sensory deficit.  Skin: Skin is warm and dry. No rash noted.  Psychiatric: He has a normal mood and affect.    ED Course  Procedures (including critical care time) Labs Review Labs Reviewed  CBC WITH DIFFERENTIAL - Abnormal; Notable for the following:    WBC 13.8 (*)  Neutrophils Relative % 82 (*)    Neutro Abs 11.3 (*)    Lymphocytes Relative 9 (*)    Monocytes Absolute 1.2 (*)    All other components within normal limits  BASIC METABOLIC PANEL - Abnormal; Notable for the following:    Glucose, Bld 114 (*)    All other components within normal limits   Imaging Review Dg Chest 2 View  10/22/2012   CLINICAL DATA:  Cough for several months with a on and off fevers.  EXAM: CHEST  2 VIEW  COMPARISON:  09/21/2012  FINDINGS: Opacity projects at the right lung base mildly increased from the prior exam. The lungs are otherwise clear. No pleural effusion or pneumothorax.  The cardiac silhouette is normal in size and configuration. The mediastinum is normal in contour.  The bony thorax is intact.  IMPRESSION: 1. Mild opacity at the right lung base, some of  which lies in the right middle lobe, with additional opacity most likely in the right lower lobe. Although this was present on the most recent prior study, it is more prominent. Given the symptoms, a followup chest CT with contrast is recommended.   Electronically Signed   By: Amie Portland M.D.   On: 10/22/2012 11:47   Ct Chest W Contrast  10/22/2012   CLINICAL DATA:  Chronic cough. Patient coughed up a hard small object today. Marland Kitchen  EXAM: CT CHEST WITH CONTRAST  TECHNIQUE: Multidetector CT imaging of the chest was performed during intravenous contrast administration.  CONTRAST:  80mL OMNIPAQUE IOHEXOL 300 MG/ML  SOLN  COMPARISON:  Multiple prior chest x-rays, most recent 10/22/2012  FINDINGS: Right middle lobe atelectasis and postobstructive infiltrate. Inflammatory change surrounding the right middle lobe bronchus with granulomatous change. Consider bronchoscopy for further evaluation; the findings are likely inflammatory in nature, but I am unable to exclude underlying neoplasm. 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 Similar changes in the subcarinal region. No significant left hilar granulomatous change. Left lung clear.  Normal heart size. No pleural or pericardial effusion. No coronary artery calcification. No noncalcified pulmonary nodules. Normal esophagus. Trachea midline. Normal arch and great vessels with only minimal transverse arch atheromatous change. Negative osseous structures. Extra thoracic soft tissues within normal limits.  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 Consider bronchoscopy for further evaluation giving the relatively long standing duration of symptoms. See discussion above   Electronically Signed   By: Davonna Belling M.D.   On: 10/22/2012 21:27    EKG Interpretation   None       MDM   1. Post-obstructive pneumonia due to foreign body aspiration   2. Calcified  granuloma of lung     CXR results from earlier today were NOT NORMAL despite what the patient was told. On my review of the images there is a high density area near the apex of the triangular opacity, could be location of coughed up popcorn kernel but could be normal vasculature. Similar findings seen on pCXR done about a month ago here although he was coughing then too. Will send for CT to eval these findings and to ensure no other underlying abnormality.   9:39 PM CT images reviewed. Will treat for CAP. Pt does not tolerate Levaquin. Will start Zithromax. Close PCP and Pulmonology followup for recheck. Hard object sent to Pathology for review.     Charles B. Bernette Mayers, MD 10/22/12 2141

## 2012-10-28 ENCOUNTER — Telehealth (HOSPITAL_COMMUNITY): Payer: Self-pay | Admitting: Emergency Medicine

## 2012-10-29 ENCOUNTER — Institutional Professional Consult (permissible substitution): Payer: BC Managed Care – PPO | Admitting: Internal Medicine

## 2012-10-30 DIAGNOSIS — R9389 Abnormal findings on diagnostic imaging of other specified body structures: Secondary | ICD-10-CM | POA: Insufficient documentation

## 2012-10-31 ENCOUNTER — Institutional Professional Consult (permissible substitution): Payer: BC Managed Care – PPO | Admitting: Internal Medicine

## 2012-11-12 DIAGNOSIS — T17808A Unspecified foreign body in other parts of respiratory tract causing other injury, initial encounter: Secondary | ICD-10-CM | POA: Insufficient documentation

## 2012-11-12 DIAGNOSIS — T783XXA Angioneurotic edema, initial encounter: Secondary | ICD-10-CM | POA: Insufficient documentation

## 2012-11-12 DIAGNOSIS — Z889 Allergy status to unspecified drugs, medicaments and biological substances status: Secondary | ICD-10-CM | POA: Insufficient documentation

## 2012-11-12 DIAGNOSIS — T360X5A Adverse effect of penicillins, initial encounter: Secondary | ICD-10-CM | POA: Insufficient documentation

## 2012-11-14 ENCOUNTER — Other Ambulatory Visit: Payer: Self-pay

## 2013-08-13 DIAGNOSIS — R972 Elevated prostate specific antigen [PSA]: Secondary | ICD-10-CM | POA: Insufficient documentation

## 2014-06-25 ENCOUNTER — Institutional Professional Consult (permissible substitution): Payer: BC Managed Care – PPO | Admitting: Neurology

## 2015-01-19 ENCOUNTER — Ambulatory Visit (HOSPITAL_COMMUNITY)
Admission: RE | Admit: 2015-01-19 | Discharge: 2015-01-19 | Disposition: A | Discharge status: Home / Self Care | Payer: BC Managed Care – PPO | Source: Ambulatory Visit | Attending: Family Medicine | Admitting: Family Medicine

## 2015-01-19 ENCOUNTER — Other Ambulatory Visit (HOSPITAL_COMMUNITY): Payer: Self-pay | Admitting: Family Medicine

## 2015-01-19 DIAGNOSIS — R0602 Shortness of breath: Secondary | ICD-10-CM | POA: Insufficient documentation

## 2015-01-19 DIAGNOSIS — Z87891 Personal history of nicotine dependence: Secondary | ICD-10-CM | POA: Insufficient documentation

## 2015-01-19 DIAGNOSIS — R05 Cough: Secondary | ICD-10-CM | POA: Diagnosis present

## 2015-01-19 DIAGNOSIS — A493 Mycoplasma infection, unspecified site: Secondary | ICD-10-CM

## 2015-01-19 DIAGNOSIS — Z8701 Personal history of pneumonia (recurrent): Secondary | ICD-10-CM | POA: Diagnosis not present

## 2015-03-03 ENCOUNTER — Emergency Department (HOSPITAL_COMMUNITY): Payer: BC Managed Care – PPO

## 2015-03-03 ENCOUNTER — Encounter (HOSPITAL_COMMUNITY): Payer: Self-pay | Admitting: Emergency Medicine

## 2015-03-03 ENCOUNTER — Emergency Department (HOSPITAL_COMMUNITY)
Admission: EM | Admit: 2015-03-03 | Discharge: 2015-03-03 | Disposition: A | Discharge status: Home / Self Care | Payer: BC Managed Care – PPO | Attending: Emergency Medicine | Admitting: Emergency Medicine

## 2015-03-03 DIAGNOSIS — R509 Fever, unspecified: Secondary | ICD-10-CM | POA: Insufficient documentation

## 2015-03-03 DIAGNOSIS — J029 Acute pharyngitis, unspecified: Secondary | ICD-10-CM | POA: Diagnosis present

## 2015-03-03 DIAGNOSIS — Z7982 Long term (current) use of aspirin: Secondary | ICD-10-CM | POA: Diagnosis not present

## 2015-03-03 DIAGNOSIS — F1010 Alcohol abuse, uncomplicated: Secondary | ICD-10-CM | POA: Insufficient documentation

## 2015-03-03 DIAGNOSIS — B349 Viral infection, unspecified: Secondary | ICD-10-CM | POA: Insufficient documentation

## 2015-03-03 LAB — RAPID STREP SCREEN (MED CTR MEBANE ONLY): STREPTOCOCCUS, GROUP A SCREEN (DIRECT): NEGATIVE

## 2015-03-03 LAB — I-STAT CHEM 8, ED
BUN: 10 mg/dL (ref 6–20)
CALCIUM ION: 1.16 mmol/L (ref 1.12–1.23)
CHLORIDE: 102 mmol/L (ref 101–111)
CREATININE: 0.8 mg/dL (ref 0.61–1.24)
GLUCOSE: 123 mg/dL — AB (ref 65–99)
HCT: 46 % (ref 39.0–52.0)
Hemoglobin: 15.6 g/dL (ref 13.0–17.0)
Potassium: 3.7 mmol/L (ref 3.5–5.1)
Sodium: 139 mmol/L (ref 135–145)
TCO2: 23 mmol/L (ref 0–100)

## 2015-03-03 LAB — I-STAT TROPONIN, ED: TROPONIN I, POC: 0 ng/mL (ref 0.00–0.08)

## 2015-03-03 MED ORDER — ALBUTEROL SULFATE HFA 108 (90 BASE) MCG/ACT IN AERS: 2.0000 | INHALATION_SPRAY | RESPIRATORY_TRACT | Status: DC | PRN

## 2015-03-03 MED ORDER — IBUPROFEN 400 MG PO TABS
400.0000 mg | ORAL_TABLET | Freq: Once | ORAL | Status: AC
  Administered 2015-03-03: 400 mg via ORAL
  Filled 2015-03-03: qty 1

## 2015-03-03 MED ORDER — ACETAMINOPHEN 500 MG PO TABS
ORAL_TABLET | ORAL | Status: AC
  Filled 2015-03-03: qty 2

## 2015-03-03 MED ORDER — IPRATROPIUM-ALBUTEROL 0.5-2.5 (3) MG/3ML IN SOLN
3.0000 mL | Freq: Once | RESPIRATORY_TRACT | Status: AC
  Administered 2015-03-03: 3 mL via RESPIRATORY_TRACT
  Filled 2015-03-03: qty 3

## 2015-03-03 MED ORDER — BENZONATATE 100 MG PO CAPS: 100.0000 mg | ORAL_CAPSULE | Freq: Three times a day (TID) | ORAL | Status: DC | PRN

## 2015-03-03 MED ORDER — SODIUM CHLORIDE 0.9 % IV BOLUS (SEPSIS)
1000.0000 mL | Freq: Once | INTRAVENOUS | Status: AC
  Administered 2015-03-03: 1000 mL via INTRAVENOUS

## 2015-03-03 MED ORDER — ACETAMINOPHEN 500 MG PO TABS
1000.0000 mg | ORAL_TABLET | Freq: Once | ORAL | Status: AC
  Administered 2015-03-03: 1000 mg via ORAL

## 2015-03-03 NOTE — ED Notes (Signed)
Patient refuses X-Ray.

## 2015-03-03 NOTE — Discharge Instructions (Signed)
°Emergency Department Resource Guide °1) Find a Doctor and Pay Out of Pocket °Although you won't have to find out who is covered by your insurance plan, it is a good idea to ask around and get recommendations. You will then need to call the office and see if the doctor you have chosen will accept you as a new patient and what types of options they offer for patients who are self-pay. Some doctors offer discounts or will set up payment plans for their patients who do not have insurance, but you will need to ask so you aren't surprised when you get to your appointment. ° °2) Contact Your Local Health Department °Not all health departments have doctors that can see patients for sick visits, but many do, so it is worth a call to see if yours does. If you don't know where your local health department is, you can check in your phone book. The CDC also has a tool to help you locate your state's health department, and many state websites also have listings of all of their local health departments. ° °3) Find a Walk-in Clinic °If your illness is not likely to be very severe or complicated, you may want to try a walk in clinic. These are popping up all over the country in pharmacies, drugstores, and shopping centers. They're usually staffed by nurse practitioners or physician assistants that have been trained to treat common illnesses and complaints. They're usually fairly quick and inexpensive. However, if you have serious medical issues or chronic medical problems, these are probably not your best option. ° °No Primary Care Doctor: °- Call [facility] at  832-8000 - they can help you locate a primary care doctor that  accepts your insurance, provides certain services, etc. °- Physician Referral Service- 1-800-533-3463 ° °Chronic Pain Problems: °Organization         Address  Phone   Notes  °[facility]  (336) 297-2271 Patients need to be referred by their primary care doctor.  ° °Medication  Assistance: °Organization         Address  Phone   Notes  °[place] [facility] [address] °[place], [place] [address] (336) 641-8030 --Must be a resident of [place] °-- Must have NO insurance coverage whatsoever (no Medicaid/ Medicare, etc.) °-- The pt. MUST have a primary care doctor that directs their care regularly and follows them in the community °  °[facility]  (866) 331-1348   °[employer]  (888) 892-1162   ° °Agencies that provide inexpensive medical care: °Organization         Address  Phone   Notes  °[facility]  (336) 832-8035   °[facility]    (336) 832-7272   °[facility] [address] °[place], [place] [address] (336) 832-4777   °[facility] [address], °[place] (336) 271-4999   °[facility]    (336) 373-0678   °[facility]    (336) 272-1050   °[facility] ° [address], [place] Phone:  (336) 832-4444, Fax:  (336) 832-4440 Hours of Operation:  9 am - 6 pm, M-F.  Also accepts Medicaid/Medicare and self-pay.  °[facility] ° [address], [place] Phone: (336) 832-3150, Fax: (336) 832-3151. Hours of Operation:  8:30 am - 5:30 pm, M-F.  Also accepts Medicaid and self-pay.  °[facility] [place] [address], [place] Phone: (336) 878-6027   °[facility] [address], [place], [place] (336)723-1848, Ext. 123 Mondays & Thursdays: 7-9 AM.  First 15 patients are seen on a first come, first serve basis. °  ° °Medicaid-accepting [place] Providers: ° °Organization         Address  Phone   Notes  °[facility] [address], [place] (336) 641-2100 Also accepts self-pay patients.  °[facility] [address], [place] ° (336) 856-9996   °[facility] [address], [place]  (336) 288-8857   °[facility] [address] [place] [address], [place] (336) 299-7000   °[facility] [address], [place]  ° (336) 373-1557 Only accepts [place] Access Medicaid patients after they have their name applied to their card.  ° °Self-Pay (no insurance) in [place]: ° °Organization         Address  Phone   Notes  °Sickle Cell Patients, [facility] [address], [place] (336) 832-1970   °[facility] [address], [place] (336) 832-4400   °[facility] ° [address], [place] (336) 992-4800   °[facility]/Dr. Osei-Bonsu ° [address] [place] [address], [place] or [address], [place] (336) 841-8500 Phone number for both [place] and [place] locations is the same.  °[facility] [address], [place] (336) 299-0000   °[facility] [address] [place] [address], [place] or [address] (336) 852-7530 °(336) 878-2260   °[facility] [address], [place] (336) 350-1642, phone; (336) 294-5005, fax Sees patients 1st and 3rd Saturday of every month.  Must not qualify for public or private insurance (i.e. Medicaid, Medicare, [place] Health Choice, Veterans' Benefits) • Household income should be no more than 200% of the poverty level •The clinic cannot treat you if you are pregnant or think you are pregnant • Sexually transmitted diseases are not treated at the clinic.  ° ° °Dental Care: °Organization         Address  Phone  Notes  °[place] [facility] [address], [place] (336) 641-6152 Accepts children up to age 21 who are enrolled in Medicaid or [place] Health Choice; pregnant women with a Medicaid card; and children who have applied for Medicaid or [place] Health Choice, but were declined, whose parents can pay a reduced fee at time of service.  °[place]  [facility] [place]  [address], [place] (336) 641-7733 Accepts children up to age 21 who are enrolled in Medicaid or  Health Choice; pregnant women with a Medicaid card; and children who have applied for Medicaid or  Health Choice, but were declined, whose parents can pay a reduced fee at time of service.  °[facility] ° [address], [place] (336) 641-4533 Patients are seen by appointment only. Walk-ins are not accepted. [facility] will see patients 18 years of age and older. °Monday - Tuesday (8am-5pm) °Most Wednesdays (8:30-5pm) °$30 per visit, cash only  °[facility] ° [address], [place] (336) 641-4533 Patients are seen by appointment only. Walk-ins are not accepted. [facility] will see patients 18 years of age and older. °One   Wednesday Evening (Monthly: Volunteer Based).  $30 per visit, cash only  °[facility]  (919) 537-3737 for adults; Children under age 4, call Graduate Pediatric Dentistry at ([phone]) [phone]. Children aged 4-14, please call (919) [phone] to request a pediatric application. ° Dental services are provided in all areas of dental care including fillings, crowns and bridges, complete and partial dentures, implants, gum treatment, root canals, and extractions. Preventive care is also provided. Treatment is provided to both adults and children. °Patients are selected via a lottery and there is often a waiting list. °  °[facility] [address], °[place] ° (336) 763-8833 [email or web address] °  °[facility] [address], [place], [place] (336)723-1848, Ext. 123 Second and Fourth Thursday of each month, opens at 6:30 AM; Clinic ends at 9 AM.  Patients are seen on a first-come first-served basis, and a limited number are seen during each clinic.  ° °[facility] ° [address], [place], [place] (336) 723-7904    Eligibility Requirements °You must have lived in [place], [place], or [place] counties for at least the last three months. °  You cannot be eligible for state or federal sponsored healthcare insurance, including Veterans Administration, Medicaid, or Medicare. °  You generally cannot be eligible for healthcare insurance through your employer.  °  How to apply: °Eligibility screenings are held every Tuesday and Wednesday afternoon from 1:00 pm until 4:00 pm. You do not need an appointment for the interview!  °[facility] [address], [place], [place] 336-631-2330   °[facility]  336-342-8273   °[facility]  336-703-3100   °[facility]  336-570-6415   ° °Behavioral Health Resources in the Community: °Intensive Outpatient Programs °Organization         Address  Phone  Notes  °[facility] [address], [place], [place] 336-878-6098   °[facility] [address], [place], [place] 336-832-9800   °[facility] [address], [place], [place] ° 336-882-2125   °[facility] [address],  °[place], [place] 1-800-853-5163 or 336-641-4981   °Substance Abuse Resources °Organization         Address  Phone  Notes  °[facility]  336-882-2125   °[facility]  336-784-9470   °[facility]  336-285-9073   °[facility]  336-845-3988   °Residential & Outpatient Substance Abuse Program  1-800-659-3381   °Psychological Services °Organization         Address  Phone  Notes  °[facility]  336- 832-9600   °[facility]  336- 378-7881   °[facility] [address], [place] 1-800-853-5163 or 336-641-4981   ° °Mobile Crisis Teams °Organization         Address  Phone  Notes  °[facility], Mobile Crisis Care Unit  1-877-626-1772   °Assertive °[facility] ° [address]  [place], [place] 336-834-9664   °Sharon DeEsch [address] °[place] 336-554-5454   ° °Self-Help/Support Groups °Organization         Address  Phone             Notes  °[facility] - variety of support groups  336- 373-1402 Call for more information  °Narcotics Anonymous (NA), [facility] [address], °[place]  2 meetings at this location  ° °  Residential Treatment Programs Organization         Address  Phone  Notes  [facility] [address],    [place]  1-865-534-4765   [facility]  [address], [place] [id number], [place], [place]   [place], [place] 276-744-3369 Admissions: 8am-3pm M-F  [facility] [address]    [place], [place] X4321937   [facility] [address] [place], [place], [place]   [facility] [address]  [place], [place]   [facility] - Intensive Out[address], [place], [place]   [facility] ([place] [address]  [place], [place] 1-931 822 5147 or (210)852-3395   [facility] [address], [place], [place] Accepts Medicaid  [facility] [address] [place] [address]  [place] 1-340-317-5210 Substance Abuse/Addiction Treatment   [facility] Organization         Address  Phone  Notes  [facility]  380-214-8991   Domenic Schwab, PhD [address] [place], [place]   (815)788-4108 or (937) 561-6365   [facility] [place], [place] (985) 137-3862   [facility] [address], [place], [place] 203-183-5555 Insurance/Medicaid/sponsorship through [facility] Families [address], [address] [place]                                    [place], [place] (405) 119-5837 [place] [address], [place] [phone]    Dr. Adele Schilder  857-380-2577   [facility] Dept. 1) [address] [place] 2) [place] 3)  [address], [place] 385-259-4185 585-755-1473  587-617-9118   [place] 720 350 8212 or (323)412-9094 (After Hours)      Take over the counter decongestant (such as sudafed), as directed on packaging, for the next week.  Use over the counter normal saline nasal spray, as instructed in the Emergency Department, several times per day for the next 2 weeks.  Take over the counter tylenol and ibuprofen, as directed on packaging, as needed for discomfort.  Gargle with warm water several times per day to help with discomfort.  May also use over the counter sore throat pain medicines such as chloraseptic or sucrets, as directed on packaging, as needed for discomfort.  Take the prescriptions as directed.  Use your albuterol inhaler (2 to 4 puffs) every 4 hours for the next 7 days, then as needed for cough, wheezing, or shortness of breath.  Call your regular medical doctor tomorrow morning to schedule a follow up appointment within the next 2 to 3 days.  Return to the Emergency Department immediately sooner if worsening.

## 2015-03-03 NOTE — ED Notes (Signed)
Pt c/o cough/fever/body aches/dizziness since last night.

## 2015-03-03 NOTE — ED Notes (Signed)
Pt refused chest xray until seen by EDP.

## 2015-03-03 NOTE — ED Provider Notes (Signed)
CSN: [id number]     Arrival date & time 03/03/15  1140 History   First MD Initiated Contact with Patient 03/03/15 1451     Chief Complaint  Patient presents with  . Fatigue      HPI  Pt was seen at 1505.  Per pt, c/o gradual onset and persistence of constant sore throat, runny/stuffy nose, sinus congestion, and cough for the past 2 days.  Has been associated with home fevers/chills, generalized body aches and fatigue. Denies rash, no CP/SOB, no N/V/D, no abd pain.    History reviewed. No pertinent past medical history.   Past Surgical History  Procedure Laterality Date  . Tonsillectomy    . Cholecystectomy    . Tendon repair      right hand    Social History  Substance Use Topics  . Smoking status: Never Smoker   . Smokeless tobacco: None  . Alcohol Use: Yes     Comment: occasionally    Review of Systems ROS: Statement: All systems negative except as marked or noted in the HPI; Constitutional: +fever and chills, generalized body aches/fatigue. ; ; Eyes: Negative for eye pain, redness and discharge. ; ; ENMT: Negative for ear pain, hoarseness, +nasal congestion, sinus pressure and sore throat. ; ; Cardiovascular: Negative for chest pain, palpitations, diaphoresis, dyspnea and peripheral edema. ; ; Respiratory: +cough. Negative for wheezing and stridor. ; ; Gastrointestinal: Negative for nausea, vomiting, diarrhea, abdominal pain, blood in stool, hematemesis, jaundice and rectal bleeding. . ; ; Genitourinary: Negative for dysuria, flank pain and hematuria. ; ; Musculoskeletal: Negative for back pain and neck pain. Negative for swelling and trauma.; ; Skin: Negative for pruritus, rash, abrasions, blisters, bruising and skin lesion.; ; Neuro: Negative for headache, lightheadedness and neck stiffness. Negative for weakness, altered level of consciousness , altered mental status, extremity weakness, paresthesias, involuntary movement, seizure and syncope.      Allergies  Penicillins;  Procaine hcl; and Levaquin  Home Medications   Prior to Admission medications   Medication Sig Start Date End Date Taking? Authorizing Provider  aspirin 81 MG tablet Take 81 mg by mouth daily.     Yes Historical Provider, MD  cholecalciferol (VITAMIN D) 1000 units tablet Take 1,000 Units by mouth 2 (two) times daily.   Yes Historical Provider, MD  Multiple Vitamin (MULTIVITAMIN) capsule Take 1 capsule by mouth daily.     Yes Historical Provider, MD  acetaminophen (TYLENOL) 500 MG tablet Take 500 mg by mouth every 6 (six) hours as needed for pain.    Historical Provider, MD  azithromycin (ZITHROMAX) 250 MG tablet Take 1 tablet (250 mg total) by mouth daily. Patient not taking: Reported on 03/03/2015 10/22/12   Calvert Cantor, MD  nitroGLYCERIN (NITROSTAT) 0.4 MG SL tablet Place 1 tablet (0.4 mg total) under the tongue every 5 (five) minutes as needed for chest pain. Patient not taking: Reported on 03/03/2015 [id number]   Delora Fuel, MD   BP 123XX123 mmHg  Pulse 127  Temp(Src) 100 F (37.8 C) (Oral)  Resp 18  Ht 5\' 9"  (1.753 m)  Wt 235 lb (106.595 kg)  BMI 34.69 kg/m2  SpO2 100%   Patient Vitals for the past 24 hrs:  BP Temp Temp src Pulse Resp SpO2 Height Weight  03/03/15 1922 - - - - - 98 % - -  03/03/15 1918 - - - - - 98 % - -  03/03/15 1756 127/72 mmHg - - 106 16 97 % - -  03/03/15 1523 -  100 F (37.8 C) Oral - - - - -  03/03/15 1449 - 99.9 F (37.7 C) Oral (!) 127 - 100 % - -  03/03/15 1316 130/80 mmHg 102.9 F (39.4 C) - (!) 138 18 98 % - -  03/03/15 1152 138/85 mmHg 100.7 F (38.2 C) Oral (!) 140 18 100 % 5\' 9"  (1.753 m) 235 lb (106.595 kg)     Physical Exam  1510: Physical examination:  Nursing notes reviewed; Vital signs and O2 SAT reviewed; +febrile.;; Constitutional: Well developed, Well nourished, Well hydrated, In no acute distress; Head:  Normocephalic, atraumatic; Eyes: EOMI, PERRL, No scleral icterus; ENMT: TM's clear bilat. +edemetous nasal turbinates bilat with  clear rhinorrhea. Mouth and pharynx without lesions. No tonsillar exudates. No intra-oral edema. No submandibular or sublingual edema. No hoarse voice, no drooling, no stridor. No pain with manipulation of larynx. No trismus. Mouth and pharynx normal, Mucous membranes moist; Neck: Supple, Full range of motion, No lymphadenopathy; Cardiovascular: Tachycardic rate and rhythm, No gallop; Respiratory: Breath sounds coarse & equal bilaterally, No wheezes. +moist cough during exam. Speaking full sentences with ease, Normal respiratory effort/excursion; Chest: Nontender, Movement normal; Abdomen: Soft, Nontender, Nondistended, Normal bowel sounds; Genitourinary: No CVA tenderness; Extremities: Pulses normal, No tenderness, No edema, No calf edema or asymmetry.; Neuro: AA&Ox3, Major CN grossly intact.  Speech clear. No gross focal motor or sensory deficits in extremities.; Skin: Color normal, Warm, Dry.   ED Course  Procedures (including critical care time) Labs Review  Imaging Review  I have personally reviewed and evaluated these images and lab results as part of my medical decision-making.   EKG Interpretation   Date/Time:  Wednesday March 03 2015 11:57:12 EST Ventricular Rate:  141 PR Interval:  134 QRS Duration: 86 QT Interval:  270 QTC Calculation: 413 R Axis:   72 Text Interpretation:  Sinus tachycardia Nonspecific ST abnormality When  compared with ECG of 09/21/2012 Nonspecific ST and T wave abnormality is  now Present Rate faster Confirmed by [facility]  MD, Nunzio Cory [phone]) on  03/03/2015 5:01:09 PM      MDM  MDM Reviewed: previous chart, nursing note and vitals Reviewed previous: labs and ECG Interpretation: labs, ECG and x-ray      Results for orders placed or performed during the hospital encounter of 03/03/15  Rapid strep screen  Result Value Ref Range   Streptococcus, Group A Screen (Direct) NEGATIVE NEGATIVE  I-stat Chem 8, ED  Result Value Ref Range   Sodium 139 135  - 145 mmol/L   Potassium 3.7 3.5 - 5.1 mmol/L   Chloride 102 101 - 111 mmol/L   BUN 10 6 - 20 mg/dL   Creatinine, Ser 0.80 0.61 - 1.24 mg/dL   Glucose, Bld 123 (H) 65 - 99 mg/dL   Calcium, Ion 1.16 1.12 - 1.23 mmol/L   TCO2 23 0 - 100 mmol/L   Hemoglobin 15.6 13.0 - 17.0 g/dL   HCT 46.0 39.0 - 52.0 %  I-stat troponin, ED  Result Value Ref Range   Troponin i, poc 0.00 0.00 - 0.08 ng/mL   Comment 3           Dg Chest 2 View 03/03/2015  CLINICAL DATA:  Productive cough, fever, and chills. EXAM: CHEST  2 VIEW COMPARISON:  01/19/2015 FINDINGS: The heart size and mediastinal contours are within normal limits. Both lungs are clear. The visualized skeletal structures are unremarkable. IMPRESSION: No active cardiopulmonary disease. Electronically Signed   By: Sharrie Rothman.D.  On: 03/03/2015 18:04    1940:  Feels better and wants to go home now. Pt has tol PO well without N/V. No stooling while in the ED. Abd remains benign. Fever and HR improved after IVF and tylenol/motrin. Workup reassuring, tx symptomatically at this time.   Francine Graven, DO 03/06/15 2031

## 2015-03-05 ENCOUNTER — Encounter: Payer: Self-pay | Admitting: Family Medicine

## 2015-03-05 ENCOUNTER — Ambulatory Visit (INDEPENDENT_AMBULATORY_CARE_PROVIDER_SITE_OTHER): Payer: BC Managed Care – PPO | Admitting: Family Medicine

## 2015-03-05 VITALS — BP 132/85 | HR 101 | Temp 98.7°F | Ht 69.0 in | Wt 234.2 lb

## 2015-03-05 DIAGNOSIS — J209 Acute bronchitis, unspecified: Secondary | ICD-10-CM

## 2015-03-05 DIAGNOSIS — R6883 Chills (without fever): Secondary | ICD-10-CM | POA: Diagnosis not present

## 2015-03-05 DIAGNOSIS — R059 Cough, unspecified: Secondary | ICD-10-CM

## 2015-03-05 DIAGNOSIS — R05 Cough: Secondary | ICD-10-CM

## 2015-03-05 DIAGNOSIS — R52 Pain, unspecified: Secondary | ICD-10-CM

## 2015-03-05 LAB — POCT INFLUENZA A/B
INFLUENZA B, POC: NEGATIVE
Influenza A, POC: NEGATIVE

## 2015-03-05 MED ORDER — TRIAMCINOLONE ACETONIDE 40 MG/ML IJ SUSP
40.0000 mg | Freq: Once | INTRAMUSCULAR | Status: AC
  Administered 2015-03-05: 40 mg via INTRAMUSCULAR

## 2015-03-05 MED ORDER — AZITHROMYCIN 250 MG PO TABS: ORAL_TABLET | ORAL | Status: DC

## 2015-03-05 NOTE — Progress Notes (Signed)
   HPI  Patient presents today here to est care and for acute visit  Ptt explains that he is changing practices due to being unsatidfied with [facility]  He describes 4 days of severe cough,  Subjective fever, body aches, and chills He has had sevral flu + contacts He is a 5th grade teacher  He was seen in the ED 2 days ago and had a negative CXR, he was given albuterol and tessalon. He has used the albuterol  He did not disclose valium Rx  PMH: Smoking status noted His past medical, surgical, social, and family Hx reviewed and updated in EMR ROS: Per HPI  Objective: BP 132/85 mmHg  Pulse 101  Temp(Src) 98.7 F (37.1 C) (Oral)  Ht 5\' 9"  (1.753 m)  Wt 234 lb 3.2 oz (106.232 kg)  BMI 34.57 kg/m2 Gen: NAD, alert, cooperative with exam HEENT: NCAT, TMs WNL BL, Nares clear, oropharynx with erythema CV: RRR, good S1/S2, no murmur Resp: LL lung field with persistent coarse sounds and exp rhonchi Abd: soft, TTP throughoutr- mild, no guarding Ext: No edema, warm Neuro: Alert and oriented, No gross deficits  Assessment and plan:  # Acute bronchitis I feel likely CAP, Tx with azithro Flu neg Tessalon, Mucinex DM, Albuterol RTC with any concerns Given IM steroid inj   Orders Placed This Encounter  Procedures  . DG Chest 2 View    Standing Status: Future     Number of Occurrences:      Standing Expiration Date: 05/04/2016    Order Specific Question:  Reason for Exam (SYMPTOM  OR DIAGNOSIS REQUIRED)    Answer:  cough    Order Specific Question:  Preferred imaging location?    Answer:  Internal  . POCT Influenza A/B    Meds ordered this encounter  Medications  . azithromycin (ZITHROMAX) 250 MG tablet    Sig: Take 2 tablets on day 1 and 1 tablet daily after that    Dispense:  6 tablet    Refill:  0  . triamcinolone acetonide (KENALOG-40) injection 40 mg    Sig:     Laroy Apple, MD [facility] 03/05/2015, 12:16 PM

## 2015-03-05 NOTE — Patient Instructions (Signed)
Great to meet you!  Let see you back in 2-3 months for routine follow up after your records have come in.   You are always welcome sooner.   Try mucinex DM 12 hour, it is an expectorant and cough suppressant Be sure to finish all antibiotics  Acute Bronchitis Bronchitis is when the airways that extend from the windpipe into the lungs get red, puffy, and painful (inflamed). Bronchitis often causes thick spit (mucus) to develop. This leads to a cough. A cough is the most common symptom of bronchitis. In acute bronchitis, the condition usually begins suddenly and goes away over time (usually in 2 weeks). Smoking, allergies, and asthma can make bronchitis worse. Repeated episodes of bronchitis may cause more lung problems. HOME CARE  Rest.  Drink enough fluids to keep your pee (urine) clear or pale yellow (unless you need to limit fluids as told by your doctor).  Only take over-the-counter or prescription medicines as told by your doctor.  Avoid smoking and secondhand smoke. These can make bronchitis worse. If you are a smoker, think about using nicotine gum or skin patches. Quitting smoking will help your lungs heal faster.  Reduce the chance of getting bronchitis again by:  Washing your hands often.  Avoiding people with cold symptoms.  Trying not to touch your hands to your mouth, nose, or eyes.  Follow up with your doctor as told. GET HELP IF: Your symptoms do not improve after 1 week of treatment. Symptoms include:  Cough.  Fever.  Coughing up thick spit.  Body aches.  Chest congestion.  Chills.  Shortness of breath.  Sore throat. GET HELP RIGHT AWAY IF:   You have an increased fever.  You have chills.  You have severe shortness of breath.  You have bloody thick spit (sputum).  You throw up (vomit) often.  You lose too much body fluid (dehydration).  You have a severe headache.  You faint. MAKE SURE YOU:   Understand these instructions.  Will  watch your condition.  Will get help right away if you are not doing well or get worse.   This information is not intended to replace advice given to you by your health care provider. Make sure you discuss any questions you have with your health care provider.   Document Released: 06/14/2007 Document Revised: 08/28/2012 Document Reviewed: 06/18/2012 Elsevier Interactive Patient Education [employer].

## 2015-03-06 LAB — CULTURE, GROUP A STREP (THRC)

## 2015-03-25 ENCOUNTER — Encounter: Payer: Self-pay | Admitting: Family Medicine

## 2015-03-25 ENCOUNTER — Ambulatory Visit (INDEPENDENT_AMBULATORY_CARE_PROVIDER_SITE_OTHER): Payer: BC Managed Care – PPO | Admitting: Family Medicine

## 2015-03-25 ENCOUNTER — Ambulatory Visit (INDEPENDENT_AMBULATORY_CARE_PROVIDER_SITE_OTHER): Payer: BC Managed Care – PPO

## 2015-03-25 VITALS — BP 131/80 | HR 109 | Temp 97.3°F | Ht 69.0 in | Wt 231.0 lb

## 2015-03-25 DIAGNOSIS — R059 Cough, unspecified: Secondary | ICD-10-CM

## 2015-03-25 DIAGNOSIS — R05 Cough: Secondary | ICD-10-CM

## 2015-03-25 DIAGNOSIS — F411 Generalized anxiety disorder: Secondary | ICD-10-CM

## 2015-03-25 DIAGNOSIS — R102 Pelvic and perineal pain: Secondary | ICD-10-CM

## 2015-03-25 MED ORDER — SULFAMETHOXAZOLE-TRIMETHOPRIM 800-160 MG PO TABS: 1.0000 | ORAL_TABLET | Freq: Two times a day (BID) | ORAL | Status: DC

## 2015-03-25 NOTE — Progress Notes (Signed)
   HPI  Patient presents today here with concern for prostatitis, pneumonia, and 1 to discuss anxiety.  Prostatitis He describes several episodes in the past, he has had an elevated PSA and been evaluated by urology. His maximum PSA was 4.5 He states that over the last week or so he's had pelvic discomfort, fullness feeling in the pelvis, decreased urinary stream He denies any fever, back pain, or abdominal pain.  Cough Patient explains that his previous cough that was similar to this was actually pneumonia which was found on a CAT scan at that time. He is recently treated with azithromycin which is completed. He states that he's been sleeping upright at night He does have persistent cough and mild dyspnea He also has thoracic back pain described as sharp with deep inspiration  Anxiety His previous provider treated with Valium, he did not initially disclose this medication and when I discussed it with him he stated that it was not relevant or important, however he has Valium in his pocket in a small baggie   PMH: Smoking status noted ROS: Per HPI  Objective: BP 131/80 mmHg  Pulse 109  Temp(Src) 97.3 F (36.3 C) (Oral)  Ht 5\' 9"  (1.753 m)  Wt 231 lb (104.781 kg)  BMI 34.10 kg/m2 Gen: NAD, alert, cooperative with exam HEENT: NCAT CV: RRR, good S1/S2, no murmur Resp: CTABL, no wheezes, non-labored Ext: No edema, warm Neuro: Alert and oriented, No gross deficits  DG chest - no infiltrate  Assessment and plan:  # pelvic discomfort, likely prostatitis considering Hx Treating with Bactrim Patient unable to leave a urine sample today  # Cough Unlikely CAP CXR appears clear Bactrim would not cover, there were no available antibiotics that he can tolerate to treat both (levaquin and pen allergy, doxy intolerance, doesn't want to try omnicef)  # Anxiety Discussed CBT He will return to discuss He finally disclosed valium- will discuss SSRI if he wants refill    Orders  Placed This Encounter  Procedures  . DG Chest 2 View    Standing Status: Future     Number of Occurrences: 1     Standing Expiration Date: 05/24/2016    Order Specific Question:  Reason for Exam (SYMPTOM  OR DIAGNOSIS REQUIRED)    Answer:  cough    Order Specific Question:  Preferred imaging location?    Answer:  Internal    Meds ordered this encounter  Medications  . sulfamethoxazole-trimethoprim (BACTRIM DS) 800-160 MG tablet    Sig: Take 1 tablet by mouth 2 (two) times daily.    Dispense:  20 tablet    Refill:  0    Laroy Apple, MD [facility] 03/25/2015, 5:16 PM

## 2015-03-25 NOTE — Patient Instructions (Signed)
Great to see you  Please come back if you have any worsening symptoms or new symptoms arise.

## 2015-04-02 ENCOUNTER — Ambulatory Visit: Payer: BC Managed Care – PPO | Admitting: Family Medicine

## 2015-04-29 ENCOUNTER — Encounter: Payer: Self-pay | Admitting: Family Medicine

## 2015-04-29 DIAGNOSIS — E1169 Type 2 diabetes mellitus with other specified complication: Secondary | ICD-10-CM | POA: Insufficient documentation

## 2015-05-06 ENCOUNTER — Encounter: Payer: Self-pay | Admitting: Family Medicine

## 2015-05-17 ENCOUNTER — Other Ambulatory Visit: Payer: Self-pay | Admitting: Family Medicine

## 2015-05-17 ENCOUNTER — Ambulatory Visit (INDEPENDENT_AMBULATORY_CARE_PROVIDER_SITE_OTHER): Payer: BC Managed Care – PPO | Admitting: Family Medicine

## 2015-05-17 ENCOUNTER — Encounter: Payer: Self-pay | Admitting: Family Medicine

## 2015-05-17 ENCOUNTER — Ambulatory Visit (INDEPENDENT_AMBULATORY_CARE_PROVIDER_SITE_OTHER): Payer: BC Managed Care – PPO

## 2015-05-17 VITALS — BP 141/88 | HR 100 | Temp 98.3°F | Ht 69.0 in | Wt 238.0 lb

## 2015-05-17 DIAGNOSIS — R05 Cough: Secondary | ICD-10-CM

## 2015-05-17 DIAGNOSIS — R059 Cough, unspecified: Secondary | ICD-10-CM

## 2015-05-17 DIAGNOSIS — J209 Acute bronchitis, unspecified: Secondary | ICD-10-CM

## 2015-05-17 MED ORDER — AZITHROMYCIN 250 MG PO TABS: ORAL_TABLET | ORAL | Status: DC

## 2015-05-17 NOTE — Progress Notes (Signed)
Subjective:  Patient ID: Jimmy Mathis, male    DOB: Jun 05, 1960  Age: 55 y.o. MRN: [id number]  CC: URI   HPI Jimmy Mathis presents for Patient presents with upper respiratory congestion. Rhinorrhea that is frequently purulent. There is moderate sore throat. Patient reports coughing frequently as well.-colored/purulent sputum noted. There is no fever no chills no sweats. The patient denies being short of breath. Onset was 3-5 days ago. Gradually worsening in spite of home remedies.    History Jimmy Mathis has a past medical history of Blood transfusion without reported diagnosis.   Jimmy Mathis has past surgical history that includes Tonsillectomy; Cholecystectomy; and Tendon repair.   His family history includes Cancer in his father.Jimmy Mathis reports that Jimmy Mathis has quit smoking. Jimmy Mathis does not have any smokeless tobacco history on file. Jimmy Mathis reports that Jimmy Mathis drinks alcohol. Jimmy Mathis reports that Jimmy Mathis does not use illicit drugs.    ROS Review of Systems  Constitutional: Negative for fever, chills, activity change and appetite change.  HENT: Positive for congestion, postnasal drip, rhinorrhea and sinus pressure. Negative for ear discharge, ear pain, hearing loss, nosebleeds, sneezing and trouble swallowing.   Respiratory: Negative for chest tightness and shortness of breath.   Cardiovascular: Negative for chest pain and palpitations.  Skin: Negative for rash.    Objective:  BP 141/88 mmHg  Pulse 100  Temp(Src) 98.3 F (36.8 C) (Oral)  Ht 5\' 9"  (1.753 m)  Wt 238 lb (107.956 kg)  BMI 35.13 kg/m2  SpO2 97%  BP Readings from Last 3 Encounters:  05/17/15 141/88  03/25/15 131/80  03/05/15 132/85    Wt Readings from Last 3 Encounters:  05/17/15 238 lb (107.956 kg)  03/25/15 231 lb (104.781 kg)  03/05/15 234 lb 3.2 oz (106.232 kg)     Physical Exam  Constitutional: Jimmy Mathis appears well-developed and well-nourished.  HENT:  Head: Normocephalic and atraumatic.  Right Ear: Tympanic membrane and external ear  normal. No decreased hearing is noted.  Left Ear: Tympanic membrane and external ear normal. No decreased hearing is noted.  Nose: Mucosal edema present. Right sinus exhibits no frontal sinus tenderness. Left sinus exhibits no frontal sinus tenderness.  Mouth/Throat: No oropharyngeal exudate or posterior oropharyngeal erythema.  Neck: No Brudzinski's sign noted.  Pulmonary/Chest: Breath sounds normal. No respiratory distress.  Lymphadenopathy:       Head (right side): No preauricular adenopathy present.       Head (left side): No preauricular adenopathy present.       Right cervical: No superficial cervical adenopathy present.      Left cervical: No superficial cervical adenopathy present.     Lab Results  Component Value Date   WBC 13.8* 10/22/2012   HGB 15.6 03/03/2015   HCT 46.0 03/03/2015   PLT 258 10/22/2012   GLUCOSE 123* 03/03/2015   ALT 45 05/22/2012   AST 24 05/22/2012   NA 139 03/03/2015   K 3.7 03/03/2015   CL 102 03/03/2015   CREATININE 0.80 03/03/2015   BUN 10 03/03/2015   CO2 25 10/22/2012    Dg Chest 2 View  03/03/2015  CLINICAL DATA:  Productive cough, fever, and chills. EXAM: CHEST  2 VIEW COMPARISON:  01/19/2015 FINDINGS: The heart size and mediastinal contours are within normal limits. Both lungs are clear. The visualized skeletal structures are unremarkable. IMPRESSION: No active cardiopulmonary disease. Electronically Signed   By: Earle Gell M.D.   On: 03/03/2015 18:04    Assessment & Plan:   Jimmy Mathis was seen today for  uri.  Diagnoses and all orders for this visit:  Cough  Acute bronchitis, unspecified organism  Other orders -     azithromycin (ZITHROMAX Z-PAK) 250 MG tablet; Take two right away Then one a day for the next 4 days.    I have discontinued Mr. Krawczyk's sulfamethoxazole-trimethoprim. I am also having him start on azithromycin. Additionally, I am having him maintain his aspirin, multivitamin, acetaminophen, and  cholecalciferol.  Meds ordered this encounter  Medications  . azithromycin (ZITHROMAX Z-PAK) 250 MG tablet    Sig: Take two right away Then one a day for the next 4 days.    Dispense:  6 each    Refill:  0     Follow-up: Return if symptoms worsen or fail to improve or for anxiety concerns.  Claretta Fraise, M.D.

## 2015-05-18 ENCOUNTER — Telehealth: Payer: Self-pay | Admitting: *Deleted

## 2015-05-18 NOTE — Telephone Encounter (Signed)
-----   Message from Claretta Fraise, MD sent at 05/18/2015 12:20 PM EDT ----- Your chest x-ray looked normal. Thanks, WS.

## 2015-05-18 NOTE — Telephone Encounter (Signed)
Pt notified of results Verbalizes understanding 

## 2015-05-18 NOTE — Progress Notes (Signed)
Quick Note:  Your chest x-ray looked normal. Thanks, WS. ______ 

## 2015-07-19 ENCOUNTER — Telehealth: Payer: Self-pay | Admitting: Family Medicine

## 2015-07-19 DIAGNOSIS — Z0000 Encounter for general adult medical examination without abnormal findings: Secondary | ICD-10-CM

## 2015-07-19 DIAGNOSIS — R7303 Prediabetes: Secondary | ICD-10-CM

## 2015-07-19 NOTE — Telephone Encounter (Signed)
Patient aware.

## 2015-07-19 NOTE — Telephone Encounter (Signed)
orders placed  Laroy Apple, MD [facility] 07/19/2015, 12:09 PM

## 2015-07-22 DIAGNOSIS — L578 Other skin changes due to chronic exposure to nonionizing radiation: Secondary | ICD-10-CM | POA: Insufficient documentation

## 2015-07-22 DIAGNOSIS — D229 Melanocytic nevi, unspecified: Secondary | ICD-10-CM | POA: Insufficient documentation

## 2015-07-22 DIAGNOSIS — L821 Other seborrheic keratosis: Secondary | ICD-10-CM | POA: Insufficient documentation

## 2015-07-22 DIAGNOSIS — L814 Other melanin hyperpigmentation: Secondary | ICD-10-CM | POA: Insufficient documentation

## 2015-07-22 DIAGNOSIS — D1801 Hemangioma of skin and subcutaneous tissue: Secondary | ICD-10-CM | POA: Insufficient documentation

## 2015-07-22 DIAGNOSIS — L738 Other specified follicular disorders: Secondary | ICD-10-CM | POA: Insufficient documentation

## 2015-07-23 ENCOUNTER — Telehealth: Payer: Self-pay | Admitting: Family Medicine

## 2015-07-23 ENCOUNTER — Other Ambulatory Visit: Payer: Self-pay

## 2015-07-23 ENCOUNTER — Other Ambulatory Visit: Payer: BC Managed Care – PPO

## 2015-07-23 ENCOUNTER — Telehealth: Payer: Self-pay

## 2015-07-23 DIAGNOSIS — Z0000 Encounter for general adult medical examination without abnormal findings: Secondary | ICD-10-CM

## 2015-07-23 DIAGNOSIS — Z113 Encounter for screening for infections with a predominantly sexual mode of transmission: Secondary | ICD-10-CM

## 2015-07-23 DIAGNOSIS — R972 Elevated prostate specific antigen [PSA]: Secondary | ICD-10-CM

## 2015-07-23 DIAGNOSIS — Z139 Encounter for screening, unspecified: Secondary | ICD-10-CM

## 2015-07-23 DIAGNOSIS — R7303 Prediabetes: Secondary | ICD-10-CM

## 2015-07-23 LAB — CBC WITH DIFFERENTIAL/PLATELET
BASOS ABS: 0.1 10*3/uL (ref 0.0–0.2)
Basos: 1 %
EOS (ABSOLUTE): 0.2 10*3/uL (ref 0.0–0.4)
Eos: 3 %
Hematocrit: 45.8 % (ref 37.5–51.0)
Hemoglobin: 15.6 g/dL (ref 12.6–17.7)
IMMATURE GRANULOCYTES: 1 %
Immature Grans (Abs): 0.1 10*3/uL (ref 0.0–0.1)
LYMPHS: 29 %
Lymphocytes Absolute: 2.4 10*3/uL (ref 0.7–3.1)
MCH: 28.5 pg (ref 26.6–33.0)
MCHC: 34.1 g/dL (ref 31.5–35.7)
MCV: 84 fL (ref 79–97)
MONOS ABS: 0.7 10*3/uL (ref 0.1–0.9)
Monocytes: 9 %
NEUTROS ABS: 4.8 10*3/uL (ref 1.4–7.0)
Neutrophils: 57 %
PLATELETS: 190 10*3/uL (ref 150–379)
RBC: 5.48 x10E6/uL (ref 4.14–5.80)
RDW: 15 % (ref 12.3–15.4)
WBC: 8.2 10*3/uL (ref 3.4–10.8)

## 2015-07-23 LAB — CMP14+EGFR
ALT: 55 IU/L — AB (ref 0–44)
AST: 26 IU/L (ref 0–40)
Albumin/Globulin Ratio: 1.8 (ref 1.2–2.2)
Albumin: 4.4 g/dL (ref 3.5–5.5)
Alkaline Phosphatase: 108 IU/L (ref 39–117)
BUN/Creatinine Ratio: 15 (ref 9–20)
BUN: 12 mg/dL (ref 6–24)
Bilirubin Total: 0.5 mg/dL (ref 0.0–1.2)
CALCIUM: 9.5 mg/dL (ref 8.7–10.2)
CO2: 22 mmol/L (ref 18–29)
CREATININE: 0.82 mg/dL (ref 0.76–1.27)
Chloride: 100 mmol/L (ref 96–106)
GFR calc Af Amer: 114 mL/min/{1.73_m2} (ref >59)
GFR, EST NON AFRICAN AMERICAN: 99 mL/min/{1.73_m2} (ref >59)
Globulin, Total: 2.5 g/dL (ref 1.5–4.5)
Glucose: 114 mg/dL — ABNORMAL HIGH (ref 65–99)
POTASSIUM: 4.5 mmol/L (ref 3.5–5.2)
Sodium: 139 mmol/L (ref 134–144)
Total Protein: 6.9 g/dL (ref 6.0–8.5)

## 2015-07-23 LAB — BAYER DCA HB A1C WAIVED: HB A1C: 6 % (ref <7.0)

## 2015-07-23 NOTE — Telephone Encounter (Signed)
Patient requested Heb C, PSA, STC and Vit D be added to his lab work. Labs added.

## 2015-07-23 NOTE — Telephone Encounter (Signed)
Done

## 2015-07-24 LAB — STD SCREEN (8)
HEP A IGM: NEGATIVE
HEP B S AG: NEGATIVE
HIV Screen 4th Generation wRfx: NONREACTIVE
HSV 1 Glycoprotein G Ab, IgG: <0.91 index (ref 0.00–0.90)
HSV 2 Glycoprotein G Ab, IgG: <0.91 index (ref 0.00–0.90)
Hep B C IgM: NEGATIVE
Hep C Virus Ab: <0.1 s/co ratio (ref 0.0–0.9)
RPR: NONREACTIVE

## 2015-07-24 LAB — PSA, TOTAL AND FREE
PSA FREE: 1.13 ng/mL
PSA, Free Pct: 25.1 %
Prostate Specific Ag, Serum: 4.5 ng/mL — ABNORMAL HIGH (ref 0.0–4.0)

## 2015-07-24 LAB — VITAMIN D 25 HYDROXY (VIT D DEFICIENCY, FRACTURES): VIT D 25 HYDROXY: 30.4 ng/mL (ref 30.0–100.0)

## 2015-07-29 ENCOUNTER — Ambulatory Visit (INDEPENDENT_AMBULATORY_CARE_PROVIDER_SITE_OTHER): Payer: BC Managed Care – PPO | Admitting: Family Medicine

## 2015-07-29 ENCOUNTER — Encounter: Payer: Self-pay | Admitting: Family Medicine

## 2015-07-29 VITALS — BP 147/83 | HR 107 | Temp 97.8°F | Ht 69.0 in | Wt 232.2 lb

## 2015-07-29 DIAGNOSIS — Z800 Family history of malignant neoplasm of digestive organs: Secondary | ICD-10-CM

## 2015-07-29 DIAGNOSIS — Z0000 Encounter for general adult medical examination without abnormal findings: Secondary | ICD-10-CM | POA: Diagnosis not present

## 2015-07-29 DIAGNOSIS — R0683 Snoring: Secondary | ICD-10-CM | POA: Insufficient documentation

## 2015-07-29 DIAGNOSIS — R972 Elevated prostate specific antigen [PSA]: Secondary | ICD-10-CM

## 2015-07-29 DIAGNOSIS — F411 Generalized anxiety disorder: Secondary | ICD-10-CM | POA: Insufficient documentation

## 2015-07-29 DIAGNOSIS — G479 Sleep disorder, unspecified: Secondary | ICD-10-CM | POA: Insufficient documentation

## 2015-07-29 DIAGNOSIS — R002 Palpitations: Secondary | ICD-10-CM

## 2015-07-29 DIAGNOSIS — R7303 Prediabetes: Secondary | ICD-10-CM

## 2015-07-29 NOTE — Progress Notes (Signed)
HPI  Patient presents today here for annual physical exam with several complaints.  Patient feels well, he has not been watching his diet carefully or exercising recently.  He states he's had an elevated PSA for a while, he would like to watch and wait for now.   Anxiety He is very reluctant to take medications, he has a prescription for 2 mg Valium. He states he has only taken 2 or 3 of these in the last 4 months.  Palpitations Previously in life, approximately 20 years ago, he was hospitalized for palpitations, he was about to be shocked before discharge whenever he had resolution and had normal return of heart rate. He states that since that time he has not had many problems, however over the last 6 months or so he's had infrequent episodes of racing heart, proximal Lieut. 15 extra fast beats. He denies any dyspnea, syncope, lightheadedness, weakness, or chest pain during these episodes.  Sleep Has difficulty sleeping, states that he falls asleep very quickly but then wakes up 4 hours later and cannot fall back asleep. He states he has a sore throat and believes that he snores loudly but no one sleeps with him so he is unsure. He does not take medications at this time.  Prediabetes Not watching diet, previously told he had this as well.  Family history of colon cancer Previously had normal colonoscopy 5 years ago. He would like to get another colonoscopy sooner, he's had one physician recommended giving it at 5 years, another recommended at 10 years because of his normal colonoscopy. He had his grandfather and 2 uncles with colon cancer. No hematochezia   PMH: Smoking status noted ROS: Per HPI  Objective: BP 147/83 mmHg  Pulse 107  Temp(Src) 97.8 F (36.6 C) (Oral)  Ht 5\' 9"  (1.753 m)  Wt 232 lb 3.2 oz (105.325 kg)  BMI 34.27 kg/m2 Gen: NAD, alert, cooperative with exam HEENT: NCAT, EOMI, PERRL CV: RRR, good S1/S2, no murmur Resp: CTABL, no wheezes, non-labored Abd:  SNTND, BS present, no guarding or organomegaly Ext: No edema, warm Neuro: Alert and oriented, No gross deficits  Assessment and plan:  # Anxiety Offered vacation Lexapro versus Cymbalta. He would like to pursue cognitive behavioral therapy, I have referred him to psychiatry  # Prediabetes Discussed diet and exercise Repeat A1c in 6 months A1C was 6.0 today.  # Palpitations Benign sounding palpitations, however he did have a hospitalization which makes me at least slightly concerned about A. fib versus SVT. Asymptomatic Discussed option of EKG and Holter monitor which I offered today, he would like to defer and return to clinic if he has worsening symptoms. He is concerned this is worsened by anxiety  # Difficulty sleeping, snoring He would like to get a sleep study, he thinks he slipped snores very loudly, his neck circumference is 17-1/2 inches He's sleepy during the daytime. His stop-bang score is 6   # Elevated PSA Patient would like to watch and wait, he states that it's been waxing and waning for several years Repeat months I offered urology follow-up today. He defers  #Family history of colon cancer Previous colonoscopy was 5 years ago, he is very concerned and would like to consider repeat earlier than 10 years. I've referred him to GI for discussion   Orders Placed This Encounter  Procedures  . Ambulatory referral to Psychiatry    Referral Priority:  Routine    Referral Type:  Psychiatric    Referral Reason:  Specialty Services Required    Requested Specialty:  Psychiatry    Number of Visits Requested:  1  . Ambulatory referral to Pulmonology    Referral Priority:  Routine    Referral Type:  Consultation    Referral Reason:  Specialty Services Required    Requested Specialty:  Pulmonary Disease    Number of Visits Requested:  1  . Ambulatory referral to Gastroenterology    Referral Priority:  Routine    Referral Type:  Consultation    Referral Reason:   Specialty Services Required    Number of Visits Requested:  Dexter, MD [facility] 07/29/2015, 2:59 PM

## 2015-07-29 NOTE — Patient Instructions (Signed)
Great to see you!  I have sent a few referrals  Pulmonology for a sleep study and discussion about sleep Psychiatry for aanxiety GI for a colonoscopy  Consider cymbalta as a treatment for anxiety  Try to get back in the routine of regular exercise and limiting high carbohydrate and high fat foods.

## 2015-07-30 ENCOUNTER — Encounter: Payer: Self-pay | Admitting: Internal Medicine

## 2015-07-30 ENCOUNTER — Telehealth: Payer: Self-pay | Admitting: Internal Medicine

## 2015-07-30 DIAGNOSIS — Z800 Family history of malignant neoplasm of digestive organs: Secondary | ICD-10-CM | POA: Insufficient documentation

## 2015-07-30 NOTE — Telephone Encounter (Signed)
Spoke with patient and he states he will call back to schedule colonoscopy.

## 2015-07-30 NOTE — Telephone Encounter (Signed)
Dr.Gessner has accepted to see patient for a repeat colonoscopy.

## 2015-08-10 ENCOUNTER — Ambulatory Visit (INDEPENDENT_AMBULATORY_CARE_PROVIDER_SITE_OTHER): Payer: BC Managed Care – PPO

## 2015-08-10 ENCOUNTER — Encounter: Payer: Self-pay | Admitting: Family Medicine

## 2015-08-10 ENCOUNTER — Ambulatory Visit (INDEPENDENT_AMBULATORY_CARE_PROVIDER_SITE_OTHER): Payer: BC Managed Care – PPO | Admitting: Family Medicine

## 2015-08-10 VITALS — BP 122/84 | HR 102 | Temp 99.8°F | Ht 69.0 in | Wt 233.0 lb

## 2015-08-10 DIAGNOSIS — R05 Cough: Secondary | ICD-10-CM

## 2015-08-10 DIAGNOSIS — L237 Allergic contact dermatitis due to plants, except food: Secondary | ICD-10-CM

## 2015-08-10 DIAGNOSIS — J209 Acute bronchitis, unspecified: Secondary | ICD-10-CM

## 2015-08-10 DIAGNOSIS — R059 Cough, unspecified: Secondary | ICD-10-CM

## 2015-08-10 MED ORDER — GUAIFENESIN-CODEINE 100-10 MG/5ML PO SYRP: 5.0000 mL | ORAL_SOLUTION | ORAL | 0 refills | Status: DC | PRN

## 2015-08-10 MED ORDER — BETAMETHASONE SOD PHOS & ACET 6 (3-3) MG/ML IJ SUSP
6.0000 mg | Freq: Once | INTRAMUSCULAR | Status: AC
  Administered 2015-08-10: 6 mg via INTRAMUSCULAR

## 2015-08-10 MED ORDER — LEVOFLOXACIN 500 MG PO TABS: 500.0000 mg | ORAL_TABLET | Freq: Every day | ORAL | 0 refills | Status: DC

## 2015-08-10 MED ORDER — MINOCYCLINE HCL 100 MG PO CAPS: 100.0000 mg | ORAL_CAPSULE | Freq: Two times a day (BID) | ORAL | 0 refills | Status: DC

## 2015-08-10 NOTE — Progress Notes (Signed)
Subjective:  Patient ID: Jimmy Mathis, male    DOB: 1960/08/18  Age: 55 y.o. MRN: [id number]  CC: URI (head and chest congestion, drainage, cough x 2 days) and Rash (poison oak, started >3 days ago)   HPI Patient presents today for cough for 2-3 days. He's had severe paroxysms of deep cough, but no cough in between. He has mild congestion. Minimal drainage. His main concern today is there seems to be a bubbling in his right mid chest region. He denies fever or chills. The cough is nonproductive. He is not short of breath.  3 days of rash on the arms and legs as well as torso. Exposed to poison ivy/oak and has redness and itching in the affected areas.  History Jimmy Mathis has a past medical history of Blood transfusion without reported diagnosis.   He has a past surgical history that includes Tonsillectomy; Cholecystectomy; and Tendon repair.   His family history includes Cancer in his father.He reports that he has quit smoking. He does not have any smokeless tobacco history on file. He reports that he drinks alcohol. He reports that he does not use drugs.  Current Outpatient Prescriptions on File Prior to Visit  Medication Sig Dispense Refill  . acetaminophen (TYLENOL) 500 MG tablet Take 500 mg by mouth every 6 (six) hours as needed for pain.    Marland Kitchen aspirin 81 MG tablet Take 81 mg by mouth daily.      . cholecalciferol (VITAMIN D) 1000 units tablet Take 1,000 Units by mouth 2 (two) times daily.    . Multiple Vitamin (MULTIVITAMIN) capsule Take 1 capsule by mouth daily.       No current facility-administered medications on file prior to visit.     ROS Review of Systems  Constitutional: Negative for activity change, appetite change, chills and fever.  HENT: Positive for congestion and postnasal drip. Negative for ear pain and trouble swallowing.   Respiratory: Positive for cough. Negative for chest tightness and shortness of breath.   Cardiovascular: Negative for chest pain and  palpitations.  Musculoskeletal: Negative for myalgias.  Skin: Positive for rash. Negative for color change.    Objective:  BP 122/84 (BP Location: Left Arm, Patient Position: Sitting, Cuff Size: Large)   Pulse (!) 102   Temp 99.8 F (37.7 C) (Oral)   Ht 5\' 9"  (1.753 m)   Wt 233 lb (105.7 kg)   SpO2 98%   BMI 34.41 kg/m   BP Readings from Last 3 Encounters:  08/10/15 122/84  07/29/15 (!) 147/83  05/17/15 (!) 141/88    Wt Readings from Last 3 Encounters:  08/10/15 233 lb (105.7 kg)  07/29/15 232 lb 3.2 oz (105.3 kg)  05/17/15 238 lb (108 kg)     Physical Exam  Constitutional: He is oriented to person, place, and time. He appears well-developed and well-nourished. No distress.  HENT:  Head: Normocephalic and atraumatic.  Right Ear: External ear normal.  Left Ear: External ear normal.  Nose: Nose normal.  Mouth/Throat: Oropharynx is clear and moist.  Eyes: Conjunctivae and EOM are normal. Pupils are equal, round, and reactive to light.  Neck: Normal range of motion. Neck supple. No thyromegaly present.  Cardiovascular: Normal rate, regular rhythm and normal heart sounds.   No murmur heard. Pulmonary/Chest: Effort normal and breath sounds normal. No respiratory distress. He has no wheezes. He has no rales.  Lymphadenopathy:    He has no cervical adenopathy.  Neurological: He is alert and oriented to person, place,  and time. He has normal reflexes.  Skin: Skin is warm and dry.  Psychiatric: He has a normal mood and affect. His behavior is normal. Judgment and thought content normal.    No results found for: HGBA1C  Lab Results  Component Value Date   WBC 8.2 07/23/2015   HGB 15.6 03/03/2015   HCT 45.8 07/23/2015   PLT 190 07/23/2015   GLUCOSE 114 (H) 07/23/2015   ALT 55 (H) 07/23/2015   AST 26 07/23/2015   NA 139 07/23/2015   K 4.5 07/23/2015   CL 100 07/23/2015   CREATININE 0.82 07/23/2015   BUN 12 07/23/2015   CO2 22 07/23/2015     Assessment & Plan:     Jimmy Mathis was seen today for uri and rash.  Diagnoses and all orders for this visit:  Acute bronchitis, unspecified organism -     DG Chest 2 View; Future  Cough -     DG Chest 2 View; Future  Poison oak dermatitis -     betamethasone acetate-betamethasone sodium phosphate (CELESTONE) injection 6 mg; Inject 1 mL (6 mg total) into the muscle once.  Other orders -     guaiFENesin-codeine (CHERATUSSIN AC) 100-10 MG/5ML syrup; Take 5 mLs by mouth every 4 (four) hours as needed for cough. -     Discontinue: levofloxacin (LEVAQUIN) 500 MG tablet; Take 1 tablet (500 mg total) by mouth daily. -     minocycline (MINOCIN) 100 MG capsule; Take 1 capsule (100 mg total) by mouth 2 (two) times daily. Take on an empty stomach    I have discontinued Jimmy Mathis's levofloxacin. I am also having him start on guaiFENesin-codeine and minocycline. Additionally, I am having him maintain his aspirin, multivitamin, acetaminophen, and cholecalciferol. We will continue to administer betamethasone acetate-betamethasone sodium phosphate.  Meds ordered this encounter  Medications  . betamethasone acetate-betamethasone sodium phosphate (CELESTONE) injection 6 mg  . guaiFENesin-codeine (CHERATUSSIN AC) 100-10 MG/5ML syrup    Sig: Take 5 mLs by mouth every 4 (four) hours as needed for cough.    Dispense:  180 mL    Refill:  0  . DISCONTD: levofloxacin (LEVAQUIN) 500 MG tablet    Sig: Take 1 tablet (500 mg total) by mouth daily.    Dispense:  7 tablet    Refill:  0  . minocycline (MINOCIN) 100 MG capsule    Sig: Take 1 capsule (100 mg total) by mouth 2 (two) times daily. Take on an empty stomach    Dispense:  20 capsule    Refill:  0    Minocycline replaces the levofloxacin previously sent (due to allergy)     Follow-up: Return if symptoms worsen or fail to improve.  Claretta Fraise, M.D.

## 2015-08-11 ENCOUNTER — Telehealth: Payer: Self-pay | Admitting: Family Medicine

## 2015-08-11 MED ORDER — AZITHROMYCIN 250 MG PO TABS: ORAL_TABLET | ORAL | 0 refills | Status: DC

## 2015-08-11 NOTE — Telephone Encounter (Signed)
Pt is having severe headaches with the Minocycline and would like a different antibiotic. Please advise.

## 2015-08-11 NOTE — Telephone Encounter (Signed)
Patient aware.

## 2015-08-11 NOTE — Telephone Encounter (Signed)
The requested med has been sent to the pharmacy.  Please let the patient know. Thanks, WS 

## 2015-08-12 ENCOUNTER — Telehealth (HOSPITAL_COMMUNITY): Payer: Self-pay | Admitting: *Deleted

## 2015-08-12 ENCOUNTER — Ambulatory Visit (INDEPENDENT_AMBULATORY_CARE_PROVIDER_SITE_OTHER): Payer: BC Managed Care – PPO | Admitting: Pediatrics

## 2015-08-12 ENCOUNTER — Encounter: Payer: Self-pay | Admitting: Pediatrics

## 2015-08-12 VITALS — BP 122/87 | HR 77 | Temp 98.5°F | Ht 69.0 in

## 2015-08-12 DIAGNOSIS — J069 Acute upper respiratory infection, unspecified: Secondary | ICD-10-CM | POA: Diagnosis not present

## 2015-08-12 NOTE — Patient Instructions (Signed)
Try cetirizine for nasal congestion.  For congestion in future: Netipot with distilled water 2-3 times a day to clear out sinuses Or Normal saline nasal spray Flonase steroid nasal spray Cetirizine or similar anti-histamine allergy pill Lots of fluids

## 2015-08-12 NOTE — Telephone Encounter (Signed)
phone call, no answer, left voice message regarding an appointment.

## 2015-08-12 NOTE — Progress Notes (Signed)
    Subjective:    Patient ID: Jimmy Mathis, male    DOB: Jun 16, 1960, 55 y.o.   MRN: [id number]  CC: Cough and Shortness of Breath   HPI: Jimmy Mathis is a 55 y.o. 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 Interim medical history since our last visit reviewed. Allergies and medications reviewed and updated.  History  Smoking Status  . Former Smoker  Smokeless Tobacco  . Never Used    ROS: Per HPI      Objective:    BP 122/87 (BP Location: Left Arm, Patient Position: Sitting, Cuff Size: Large)   Pulse 77   Temp 98.5 F (36.9 C) (Oral)   Ht 5\' 9"  (1.753 m)   SpO2 97%   Wt Readings from Last 3 Encounters:  08/10/15 233 lb (105.7 kg)  07/29/15 232 lb 3.2 oz (105.3 kg)  05/17/15 238 lb (108 kg)     Gen: NAD, alert, cooperative with exam, NCAT EYES: EOMI, no scleral injection or icterus ENT:  TMs pearly gray b/l, OP without erythema LYMPH: no cervical LAD CV: NRRR, normal S1/S2, no murmur, distal pulses 2+ b/l Resp: moving air well, slight wheeze with forced exhalation while coughing, no crackles, normal WOB Ext: No edema, warm Neuro: Alert and oriented     Assessment & Plan:    Jimmy Mathis was seen today for cough and shortness of breath. On treatment for CAP. Discussed symptomatic care, care in future for URI symptoms. Oxygen sats normal.  Diagnoses and all orders for this visit:  Acute URI     Follow up plan: No Follow-up on file.  Assunta Found, MD [facility] 08/12/2015, 2:47 PM

## 2015-08-24 NOTE — Telephone Encounter (Signed)
Records will be in "records reviewed" folder. °

## 2015-10-01 ENCOUNTER — Institutional Professional Consult (permissible substitution): Payer: BC Managed Care – PPO | Admitting: Pulmonary Disease

## 2015-10-06 ENCOUNTER — Encounter: Payer: Self-pay | Admitting: Pulmonary Disease

## 2015-10-06 ENCOUNTER — Ambulatory Visit (INDEPENDENT_AMBULATORY_CARE_PROVIDER_SITE_OTHER): Payer: BC Managed Care – PPO | Admitting: Pulmonary Disease

## 2015-10-06 VITALS — BP 130/78 | HR 95 | Ht 69.0 in | Wt 237.8 lb

## 2015-10-06 DIAGNOSIS — G4710 Hypersomnia, unspecified: Secondary | ICD-10-CM | POA: Diagnosis not present

## 2015-10-06 DIAGNOSIS — G4719 Other hypersomnia: Secondary | ICD-10-CM

## 2015-10-06 DIAGNOSIS — Z23 Encounter for immunization: Secondary | ICD-10-CM

## 2015-10-06 DIAGNOSIS — E669 Obesity, unspecified: Secondary | ICD-10-CM | POA: Insufficient documentation

## 2015-10-06 NOTE — Assessment & Plan Note (Signed)
Weight reduction 

## 2015-10-06 NOTE — Assessment & Plan Note (Signed)
Patient is tired all the time.  He goes to bed anywhere from 9pm-3mn. Falls asleep right away.  Wakes up 1-2 times/night to go to bathroom. Wakes up on the 4th hour and is not able to fall back asleep. Wakes up unrefreshed, tired in am. (-) headaches. Possible snoring, gasping, choking, witnessed apneas. Has hypersomnia affecting fxnality.  Gets sleepy during pm. Naps in weekend. He is single, no note of abnormal behavior in sleep.   Sx and hypersomnia have worsened through the years.   He denies depression.  Has anxiety and is controlled.    Plan : We discussed about the diagnosis of Obstructive Sleep Apnea (OSA) and implications of untreated OSA. We discussed about CPAP and BiPaP as possible treatment options.    We will schedule the patient for a sleep study. Plan for a HST. Anticipate no issues with CPAP. He however has anxiety but it is stable right now.   Patient was instructed to call the office if he/she has not heard back from the office 1-2 weeks after the sleep study.   Patient was instructed to call the office if he/she is having issues with the PAP device.   We discussed good sleep hygiene.   Patient was advised not to engage in activities requiring concentration and/or vigilance if he/she is sleepy.  Patient was advised not to drive if he/she is sleepy.

## 2015-10-06 NOTE — Patient Instructions (Signed)

## 2015-10-06 NOTE — Progress Notes (Signed)
Subjective:    Patient ID: Jimmy Mathis, male    DOB: [birth year], 55 y.o.   MRN: [id number]  HPI   This is the case of Jimmy Mathis, 55 y.o. Male, who was referred by Dr. Kenn File. in consultation regarding possible OSA.   As you very well know, patient has a 24 PY smoking history, quit when he was in his 19s, denies any lung issues.   Patient is tired all the time.  He goes to bed anywhere from 9pm-75mn. Falls asleep right away.  Wakes up 1-2 times/night to go to bathroom. Wakes up on the 4th hour and is not able to fall back asleep. Wakes up unrefreshed, tired in am. (-) headaches. Possible snoring, gasping, choking, witnessed apneas. Has hypersomnia affecting fxnality.  Gets sleepy during pm. Naps in weekend. He is single, no note of abnormal behavior in sleep.   Sx and hypersomnia have worsened through the years.   He denies depression.  Has anxiety and is controlled.   Review of Systems  Constitutional: Negative.   HENT: Negative.   Eyes: Negative.   Respiratory: Negative.   Cardiovascular: Negative.   Gastrointestinal: Positive for abdominal pain.  Endocrine: Negative.   Genitourinary: Negative.   Musculoskeletal: Negative.   Skin: Negative.   Allergic/Immunologic: Negative.   Neurological: Negative.   Hematological: Negative.   Psychiatric/Behavioral: Negative.   All other systems reviewed and are negative.  Past Medical History:  Diagnosis Date  . Blood transfusion without reported diagnosis    (-) CA, DVT.   Family History  Problem Relation Age of Onset  . Cancer Father     lung  . Diabetes Father   . Colon cancer    . Colon cancer Paternal Uncle   . Colon cancer Maternal Grandfather      Past Surgical History:  Procedure Laterality Date  . CHOLECYSTECTOMY    . TENDON REPAIR     right hand  . TONSILLECTOMY      Social History   Social History  . Marital status: Single    Spouse name: N/A  . Number of children: N/A  . Years of  education: N/A   Occupational History  . Not on file.   Social History Main Topics  . Smoking status: Former Smoker    Packs/day: 1.50    Years: 12.00  . Smokeless tobacco: Never Used     Comment: quit smoking at age 66  . Alcohol use Yes     Comment: occasionally  . Drug use: No  . Sexual activity: Not on file   Other Topics Concern  . Not on file   Social History Narrative  . 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   Allergies  Allergen Reactions  . Penicillins Nausea And Vomiting and Swelling    Has patient had a PCN reaction causing immediate rash, facial/tongue/throat swelling, SOB or lightheadedness with hypotension: No Has patient had a PCN reaction causing severe rash involving mucus membranes or skin necrosis: No Has patient had a PCN reaction that required hospitalization No Has patient had a PCN reaction occurring within the last 10 years: No If all of the above answers are "NO", then may proceed with Cephalosporin use.   . Procaine Hcl Swelling  . Levaquin [Levofloxacin In D5w] Other (See Comments)    Caused pain in tendon(s)   . Minocycline Other (See Comments)    headache     Outpatient Medications Prior to  Visit  Medication Sig Dispense Refill  . acetaminophen (TYLENOL) 500 MG tablet Take 500 mg by mouth every 6 (six) hours as needed for pain.    Marland Kitchen aspirin 81 MG tablet Take 81 mg by mouth daily.      . cholecalciferol (VITAMIN D) 1000 units tablet Take 1,000 Units by mouth 2 (two) times daily.    . Multiple Vitamin (MULTIVITAMIN) capsule Take 1 capsule by mouth daily.      Marland Kitchen guaiFENesin-codeine (CHERATUSSIN AC) 100-10 MG/5ML syrup Take 5 mLs by mouth every 4 (four) hours as needed for cough. 180 mL 0  . azithromycin (ZITHROMAX Z-PAK) 250 MG tablet Take two right away Then one a day for the next 4 days. (Patient not taking: Reported on 10/06/2015) 6 each 0   No facility-administered medications prior to visit.    No orders of the defined  types were placed in this encounter.       Objective:   Physical Exam  Vitals:  Vitals:   10/06/15 1603  BP: 130/78  Pulse: 95  SpO2: 97%  Weight: 237 lb 12.8 oz (107.9 kg)  Height: 5\' 9"  (1.753 m)    Constitutional/General:  Pleasant, well-nourished, well-developed, not in any distress,  Comfortably seating.  Well kempt  Body mass index is 35.12 kg/m. Wt Readings from Last 3 Encounters:  10/06/15 237 lb 12.8 oz (107.9 kg)  08/10/15 233 lb (105.7 kg)  07/29/15 232 lb 3.2 oz (105.3 kg)    Neck circumference: 18 in  HEENT: Pupils equal and reactive to light and accommodation. Anicteric sclerae. Normal nasal mucosa.   No oral  lesions,  mouth clear,  oropharynx clear, no postnasal drip. (-) Oral thrush. No dental caries.  Airway - Mallampati class III-IV  Neck: No masses. Midline trachea. No JVD, (-) LAD. (-) bruits appreciated.  Respiratory/Chest: Grossly normal chest. (-) deformity. (-) Accessory muscle use.  Symmetric expansion. (-) Tenderness on palpation.  Resonant on percussion.  Diminished BS on both lower lung zones. (-) wheezing, crackles, rhonchi (-) egophony  Cardiovascular: Regular rate and  rhythm, heart sounds normal, no murmur or gallops, no peripheral edema  Gastrointestinal:  Normal bowel sounds. Soft, non-tender. No hepatosplenomegaly.  (-) masses.   Musculoskeletal:  Normal muscle tone. Normal gait.   Extremities: Grossly normal. (-) clubbing, cyanosis.  (-) edema  Skin: (-) rash,lesions seen.   Neurological/Psychiatric : alert, oriented to time, place, person. Normal mood and affect         Assessment & Plan:  Hypersomnia Patient is tired all the time.  He goes to bed anywhere from 9pm-42mn. Falls asleep right away.  Wakes up 1-2 times/night to go to bathroom. Wakes up on the 4th hour and is not able to fall back asleep. Wakes up unrefreshed, tired in am. (-) headaches. Possible snoring, gasping, choking, witnessed apneas. Has  hypersomnia affecting fxnality.  Gets sleepy during pm. Naps in weekend. He is single, no note of abnormal behavior in sleep.   Sx and hypersomnia have worsened through the years.   He denies depression.  Has anxiety and is controlled.    Plan : We discussed about the diagnosis of Obstructive Sleep Apnea (OSA) and implications of untreated OSA. We discussed about CPAP and BiPaP as possible treatment options.    We will schedule the patient for a sleep study. Plan for a HST. Anticipate no issues with CPAP. He however has anxiety but it is stable right now.   Patient was instructed to call the office  if he/she has not heard back from the office 1-2 weeks after the sleep study.   Patient was instructed to call the office if he/she is having issues with the PAP device.   We discussed good sleep hygiene.   Patient was advised not to engage in activities requiring concentration and/or vigilance if he/she is sleepy.  Patient was advised not to drive if he/she is sleepy.    Obesity Weight reduction.     Thank you very much for letting me participate in this patient's care. Please do not hesitate to give me a call if you have any questions or concerns regarding the treatment plan.   Patient will follow up with me in 6-8 weeks.     Monica Becton, MD 10/06/2015   5:34 PM [facility] [place] Pager: [phone] Office: [phone], Fax: [phone]

## 2015-11-04 DIAGNOSIS — G4733 Obstructive sleep apnea (adult) (pediatric): Secondary | ICD-10-CM

## 2015-11-05 ENCOUNTER — Other Ambulatory Visit: Payer: Self-pay | Admitting: *Deleted

## 2015-11-05 ENCOUNTER — Telehealth: Payer: Self-pay | Admitting: Pulmonary Disease

## 2015-11-05 DIAGNOSIS — G4733 Obstructive sleep apnea (adult) (pediatric): Secondary | ICD-10-CM

## 2015-11-05 DIAGNOSIS — G4719 Other hypersomnia: Secondary | ICD-10-CM

## 2015-11-05 NOTE — Telephone Encounter (Signed)
  Please call the pt and tell the pt the [facility]  showed OSA  Pt stops breathing  11  times an hour.   Home sleep study was done on : 11/04/15  Please order autoCPAP 5-15 cm H2O. Patient will need a mask fitting session. Patient will need a 1 month download.   Patient needs to be seen by me or any of the NPs/APPs  4-6 weeks after obtaining the cpap machine. Let me know if you receive this.   Thanks!   J. Shirl Harris, MD 11/05/2015, 9:39 AM

## 2015-11-10 ENCOUNTER — Telehealth: Payer: Self-pay | Admitting: Pulmonary Disease

## 2015-11-10 DIAGNOSIS — G4733 Obstructive sleep apnea (adult) (pediatric): Secondary | ICD-10-CM

## 2015-11-10 NOTE — Telephone Encounter (Signed)
lmtcb x1 for pt. 

## 2015-11-10 NOTE — Telephone Encounter (Signed)
Pt aware of results. Order has been placed and pt has beeb scheduled for f/u on 12-30-15 @ 2:15 with TP. Nothing further needed.

## 2015-11-19 NOTE — Telephone Encounter (Signed)
LMOMTCB x 1 

## 2015-11-23 NOTE — Telephone Encounter (Signed)
According to Chart Review Lesleigh Noe has spoken with pt. And orders have been placed. Nothing further at this moment is needed

## 2015-11-24 ENCOUNTER — Telehealth: Payer: Self-pay | Admitting: Pulmonary Disease

## 2015-11-24 NOTE — Telephone Encounter (Signed)
Spoke with patient-he has not be able to contact [employer] as he was unsure the company being used and their phone number. I have left a detailed message on his voicemail as patient was not near a pen and paper at the time of my call. Pt stated he will contact [employer] to see about status of CPAP. Nothing more needed at this time.

## 2015-12-08 ENCOUNTER — Ambulatory Visit: Payer: Self-pay | Admitting: Pulmonary Disease

## 2015-12-16 ENCOUNTER — Ambulatory Visit (INDEPENDENT_AMBULATORY_CARE_PROVIDER_SITE_OTHER): Payer: BC Managed Care – PPO | Admitting: Family Medicine

## 2015-12-16 ENCOUNTER — Encounter: Payer: Self-pay | Admitting: Family Medicine

## 2015-12-16 ENCOUNTER — Telehealth: Payer: Self-pay | Admitting: Family Medicine

## 2015-12-16 VITALS — BP 132/74 | HR 107 | Temp 97.2°F | Ht 69.0 in | Wt 239.4 lb

## 2015-12-16 DIAGNOSIS — E669 Obesity, unspecified: Secondary | ICD-10-CM | POA: Diagnosis not present

## 2015-12-16 DIAGNOSIS — Z6835 Body mass index (BMI) 35.0-35.9, adult: Secondary | ICD-10-CM | POA: Diagnosis not present

## 2015-12-16 DIAGNOSIS — R972 Elevated prostate specific antigen [PSA]: Secondary | ICD-10-CM | POA: Diagnosis not present

## 2015-12-16 DIAGNOSIS — J209 Acute bronchitis, unspecified: Secondary | ICD-10-CM | POA: Diagnosis not present

## 2015-12-16 DIAGNOSIS — R7303 Prediabetes: Secondary | ICD-10-CM | POA: Diagnosis not present

## 2015-12-16 LAB — BAYER DCA HB A1C WAIVED: HB A1C (BAYER DCA - WAIVED): 6.6 % (ref <7.0)

## 2015-12-16 MED ORDER — METHYLPREDNISOLONE ACETATE 80 MG/ML IJ SUSP: 80.0000 mg | Freq: Once | INTRAMUSCULAR | Status: DC

## 2015-12-16 MED ORDER — AZITHROMYCIN 250 MG PO TABS: ORAL_TABLET | ORAL | 0 refills | Status: DC

## 2015-12-16 NOTE — Telephone Encounter (Signed)
Called and discussed A1C 6.6  Pt may have iatrogenic DM due to recent shoulder steroid injection ( he said recent, I am not sure how recent). Discussed limiting steroids in th efuture.   He would like to avoid meds for now and start aggressive diet and exercise control.   He would like to get diabetic education. WIll ask nursing to call and schedule appt with our clinical pharmacist for education.   Laroy Apple, MD [facility] 12/16/2015, 7:08 PM

## 2015-12-16 NOTE — Patient Instructions (Signed)
Great to see you!  Come back in 4 weeks or so for fasting labs.    Acute Bronchitis, Adult Acute bronchitis is when air tubes (bronchi) in the lungs suddenly get swollen. The condition can make it hard to breathe. It can also cause these symptoms:  A cough.  Coughing up clear, yellow, or green mucus.  Wheezing.  Chest congestion.  Shortness of breath.  A fever.  Body aches.  Chills.  A sore throat. Follow these instructions at home: Medicines  Take over-the-counter and prescription medicines only as told by your doctor.  If you were prescribed an antibiotic medicine, take it as told by your doctor. Do not stop taking the antibiotic even if you start to feel better. General instructions  Rest.  Drink enough fluids to keep your pee (urine) clear or pale yellow.  Avoid smoking and secondhand smoke. If you smoke and you need help quitting, ask your doctor. Quitting will help your lungs heal faster.  Use an inhaler, cool mist vaporizer, or humidifier as told by your doctor.  Keep all follow-up visits as told by your doctor. This is important. How is this prevented? To lower your risk of getting this condition again:  Wash your hands often with soap and water. If you cannot use soap and water, use hand sanitizer.  Avoid contact with people who have cold symptoms.  Try not to touch your hands to your mouth, nose, or eyes.  Make sure to get the flu shot every year. Contact a doctor if:  Your symptoms do not get better in 2 weeks. Get help right away if:  You cough up blood.  You have chest pain.  You have very bad shortness of breath.  You become dehydrated.  You faint (pass out) or keep feeling like you are going to pass out.  You keep throwing up (vomiting).  You have a very bad headache.  Your fever or chills gets worse. This information is not intended to replace advice given to you by your health care provider. Make sure you discuss any questions  you have with your health care provider. Document Released: 06/14/2007 Document Revised: 08/04/2015 Document Reviewed: 06/16/2015 Elsevier Interactive Patient Education  2017 [employer].

## 2015-12-16 NOTE — Progress Notes (Signed)
   HPI  Patient presents today here with cough and for follow-up of chronic medical conditions.  Cough 6-7 days of cough with worsening malaise. States that his cough may be getting slightly better, however it seems to be lingering and it's productive of thick green sputum. He is requesting a injection of steroids as well as a Z-Pak.  Patient is tolerating foods and fluids normally, denies fevers. Denies chest pain, sinus pressure, or ear pain.  Elevated PSA He has seen urology, they advised watchful waiting, if elevated then he will return for biopsy.  Prediabetes Patient is not watching diet, no regular exercise routine.   PMH: Smoking status noted ROS: Per HPI  Objective: BP 132/74   Pulse (!) 107   Temp 97.2 F (36.2 C) (Oral)   Ht _0  (1.753 m)   Wt 239 lb 6.4 oz (108.6 kg)   BMI 35.35 kg/m  Gen: NAD, alert, cooperative with exam HEENT: NCAT, oropharynx with erythema, tonsils surgically absent, TMs normal bilaterally, nares with swollen turbinates bilaterally CV: RRR, good S1/S2, no murmur Resp: CTABL, no wheezes, non-labored Ext: No edema, warm Neuro: Alert and oriented, No gross deficits  Assessment and plan:  # Acute bronchitis Persistent productive cough with a worsening course day 6. Patient has her requested azithromycin and I am Depo-Medrol, I think this is reasonable given the worsening course and severe malaise. Azithromycin sent to the pharmacy  # Elevated PSA Previously 4.5, plan on checking 2 times a year, low threshold for referral back to urology  # Obesity Discussed diet and exercise with patient, he states he has room to improve on diet  # Prediabetes Discussed therapeutic lifestyle changes A1c pending    Orders Placed This Encounter  Procedures  . Bayer DCA Hb A1c Waived    Standing Status:   Future    Number of Occurrences:   1    Standing Expiration Date:   12/15/2016  . CMP14+EGFR    Standing Status:   Future    Standing  Expiration Date:   12/15/2016  . CBC with Differential/Platelet    Standing Status:   Future    Standing Expiration Date:   12/15/2016  . Lipid panel    Standing Status:   Future    Standing Expiration Date:   12/15/2016  . PSA    Standing Status:   Future    Standing Expiration Date:   12/15/2016    Meds ordered this encounter  Medications  . azithromycin (ZITHROMAX) 250 MG tablet    Sig: Take 2 tablets on day 1 and 1 tablet daily after that    Dispense:  6 tablet    Refill:  0  . methylPREDNISolone acetate (DEPO-MEDROL) injection 80 mg    Laroy Apple, MD [facility] 12/16/2015, 4:05 PM

## 2015-12-28 NOTE — Telephone Encounter (Signed)
lmtcb jkp 12/19

## 2015-12-30 ENCOUNTER — Ambulatory Visit: Payer: Self-pay | Admitting: Adult Health

## 2015-12-30 NOTE — Telephone Encounter (Signed)
Appointment scheduled for 1/2 @ 8:30am

## 2016-01-11 ENCOUNTER — Ambulatory Visit: Payer: BC Managed Care – PPO | Admitting: Pharmacist

## 2016-01-19 ENCOUNTER — Encounter: Payer: Self-pay | Admitting: Pharmacist

## 2016-01-19 ENCOUNTER — Ambulatory Visit (INDEPENDENT_AMBULATORY_CARE_PROVIDER_SITE_OTHER): Payer: BC Managed Care – PPO | Admitting: Pharmacist

## 2016-01-19 DIAGNOSIS — E119 Type 2 diabetes mellitus without complications: Secondary | ICD-10-CM

## 2016-01-19 NOTE — Patient Instructions (Signed)
Diabetes and Standards of Medical Care   Diabetes is complicated. You may find that your diabetes team includes a dietitian, nurse, diabetes educator, eye doctor, and more. To help everyone know what is going on and to help you get the care you deserve, the following schedule of care was developed to help keep you on track. Below are the tests, exams, vaccines, medicines, education, and plans you will need.  Blood Glucose Goals Prior to meals = 80 - 130 Within 2 hours of the start of a meal = less than 180  HbA1c test (goal is less than 6.5% - your last value was 6.6%) This test shows how well you have controlled your glucose over the past 2 to 3 months. It is used to see if your diabetes management plan needs to be adjusted.   It is performed at least 2 times a year if you are meeting treatment goals.  It is performed 4 times a year if therapy has changed or if you are not meeting treatment goals.  Blood pressure test  This test is performed at every routine medical visit. The goal is less than 140/90 mmHg for most people, but 130/80 mmHg in some cases. Ask your health care provider about your goal.  Dental exam  Follow up with the dentist regularly.  Eye exam  If you are diagnosed with type 1 diabetes as a child, get an exam upon reaching the age of 10 years or older and have had diabetes for 3 to 5 years. Yearly eye exams are recommended after that initial eye exam.  If you are diagnosed with type 1 diabetes as an adult, get an exam within 5 years of diagnosis and then yearly.  If you are diagnosed with type 2 diabetes, get an exam as soon as possible after the diagnosis and then yearly.  Foot care exam  Visual foot exams are performed at every routine medical visit. The exams check for cuts, injuries, or other problems with the feet.  A comprehensive foot exam should be done yearly. This includes visual inspection as well as assessing foot pulses and testing for loss of  sensation.  Check your feet nightly for cuts, injuries, or other problems with your feet. Tell your health care provider if anything is not healing.  Kidney function test (urine microalbumin)  This test is performed once a year.  Type 1 diabetes: The first test is performed 5 years after diagnosis.  Type 2 diabetes: The first test is performed at the time of diagnosis.  A serum creatinine and estimated glomerular filtration rate (eGFR) test is done once a year to assess the level of chronic kidney disease (CKD), if present.  Lipid profile (cholesterol, HDL, LDL, triglycerides)  Performed every 5 years for most people.  The goal for LDL is less than 100 mg/dL. If you are at high risk, the goal is less than 70 mg/dL.  The goal for HDL is 40 mg/dL to 50 mg/dL for men and 50 mg/dL to 60 mg/dL for women. An HDL cholesterol of 60 mg/dL or higher gives some protection against heart disease.  The goal for triglycerides is less than 150 mg/dL.  Influenza vaccine, pneumococcal vaccine, and hepatitis B vaccine  The influenza vaccine is recommended yearly.  The pneumococcal vaccine is generally given once in a lifetime. However, there are some instances when another vaccination is recommended. Check with your health care provider.  The hepatitis B vaccine is also recommended for adults with diabetes.    Diabetes self-management education  Education is recommended at diagnosis and ongoing as needed.  Treatment plan  Your treatment plan is reviewed at every medical visit.  Document Released: 10/23/2008 Document Revised: 08/28/2012 Document Reviewed: 05/28/2012 ExitCare Patient Information 2014 [employer].   

## 2016-01-19 NOTE — Progress Notes (Signed)
Patient ID: DEYLON DIDION, male   DOB: [birth year], 56 y.o.   MRN: [id number]  Subjective:    Jimmy Mathis is a 56 y.o. male who presents for an initial evaluation of Type 2 diabetes mellitus.  Patient was just diagnosed with type 2 DM.  He reports that he has had 3 or 4 steroid injections within the last 2 to 3 months which might be affecting BG.  He previsouly has diagnosis of pre diabetes.   Father and maternal grandmother both had diabetes.   Known diabetic complications: none Cardiovascular risk factors: advanced age (older than 30 for men, 43 for women), diabetes mellitus, male gender, obesity (BMI >= 30 kg/m2) and sedentary lifestyle Current diabetic medications include none.   Eye exam current (within one year): unknown Weight trend: stable Prior visit with CDE: no Current diet: in general, an "unhealthy" diet - drinking 8 to 12 ounces of soda a day Current exercise: none Medication Compliance?  No - states that he missed about 50% of vitamin doses  Current monitoring regimen: none Home blood sugar records: n/a Any episodes of hypoglycemia? no  Is He on ACE inhibitor or angiotensin II receptor blocker?  No       The following portions of the patient's history were reviewed and updated as appropriate: allergies, current medications, past family history, past medical history, past social history, past surgical history and problem list.    Objective:    There were no vitals taken for this visit.  Lab Review Glucose (mg/dL)  Date Value  07/23/2015 114 (H)   Glucose, Bld (mg/dL)  Date Value  03/03/2015 123 (H)  10/22/2012 114 (H)  09/21/2012 160 (H)   CO2  Date Value  07/23/2015 22 mmol/L  10/22/2012 25 mEq/L  09/21/2012 26 mEq/L   BUN (mg/dL)  Date Value  07/23/2015 12  03/03/2015 10  10/22/2012 10  09/21/2012 8   Creatinine, Ser (mg/dL)  Date Value  07/23/2015 0.82  03/03/2015 0.80  10/22/2012 0.97   a1C WAS 6.6% (12/07/217)    Assessment:     Diabetes Mellitus type II, under adequate control.    Plan:    1.  Rx changes: NONE 2.  Education: Reviewed 'ABCs' of diabetes management (respective goals in parentheses):  A1C (<7), blood pressure (<130/80), and cholesterol (LDL <100). 3. Discussed pathophysiology of DM; difference between type 1 and type 2 DM. 4. CHO counting diet discussed.  Reviewed CHO amount in various foods and how to read nutrition labels.  Discussed recommended serving sizes.  5.  Recommend check BG 0  times a day - patient declined HBG monitor 6.  Recommended increase physical activity - goal is 150 minutes per week 7. Follow up: 1 month  to have labs rechecked per Dr Wendi Snipes

## 2016-01-31 ENCOUNTER — Encounter: Payer: Self-pay | Admitting: Adult Health

## 2016-01-31 ENCOUNTER — Ambulatory Visit (INDEPENDENT_AMBULATORY_CARE_PROVIDER_SITE_OTHER): Payer: BC Managed Care – PPO | Admitting: Adult Health

## 2016-01-31 VITALS — BP 118/74 | HR 57 | Ht 69.0 in | Wt 234.8 lb

## 2016-01-31 DIAGNOSIS — R0683 Snoring: Secondary | ICD-10-CM | POA: Diagnosis not present

## 2016-01-31 DIAGNOSIS — G4733 Obstructive sleep apnea (adult) (pediatric): Secondary | ICD-10-CM | POA: Diagnosis not present

## 2016-01-31 NOTE — Progress Notes (Signed)
@Patient  ID: Jimmy Mathis, male    DOB: 1960-09-27, 56 y.o.   MRN: [id number]  Chief Complaint  Patient presents with  . Follow-up    OSA     Referring provider: Timmothy Euler, MD  HPI: 56 yo male seen for sleep consult 10/06/15 found to have mild  OSA   TEST  HST 11/04/15 AHI 11.   01/31/16  Follow up : OSA  Pt returns for 4 month follow up . Pt was seen last ov for sleep consult with hypersomnia . He was set up for a HST that showed mild  OSA with AHI 11. Reviewed test results .  He was started on CPAP At bedtime  . He says he has tried several times to wear this but just can not get used to it. It causes nasal /mouth dryness.  We discussed different options such as mask fittings, increased humidification, nasal spray /gel and CPAP titration . He says he does not want to wear it.  We discussed oral appliance and wt loss.  Went over the potential complications of untreated OSA .    Allergies  Allergen Reactions  . Penicillins Nausea And Vomiting and Swelling    Has patient had a PCN reaction causing immediate rash, facial/tongue/throat swelling, SOB or lightheadedness with hypotension: No Has patient had a PCN reaction causing severe rash involving mucus membranes or skin necrosis: No Has patient had a PCN reaction that required hospitalization No Has patient had a PCN reaction occurring within the last 10 years: No If all of the above answers are "NO", then may proceed with Cephalosporin use.   . Procaine Hcl Swelling  . Levaquin [Levofloxacin In D5w] Other (See Comments)    Caused pain in tendon(s)   . Minocycline Other (See Comments)    headache    Immunization History  Administered Date(s) Administered  . Influenza,inj,Quad PF,36+ Mos 10/06/2015  . Influenza-Unspecified 10/10/2014    Past Medical History:  Diagnosis Date  . Blood transfusion without reported diagnosis   . Diabetes mellitus without complication ([place])     Tobacco History: History    Smoking Status  . Former Smoker  . Packs/day: 1.50  . Years: 12.00  Smokeless Tobacco  . Never Used    Comment: quit smoking at age 21   Counseling given: Not Answered   Outpatient Encounter Prescriptions as of 01/31/2016  Medication Sig  . aspirin 81 MG tablet Take 81 mg by mouth daily.    . Cholecalciferol (VITAMIN D) 2000 units CAPS Take 2,000 Units by mouth daily.   . [DISCONTINUED] acetaminophen (TYLENOL) 500 MG tablet Take 500 mg by mouth every 6 (six) hours as needed for pain.   No facility-administered encounter medications on file as of 01/31/2016.      Review of Systems  Constitutional:   No  weight loss, night sweats,  Fevers, chills, fatigue, or  lassitude.  HEENT:   No headaches,  Difficulty swallowing,  Tooth/dental problems, or  Sore throat,                No sneezing, itching, ear ache, nasal congestion, post nasal drip,   CV:  No chest pain,  Orthopnea, PND, swelling in lower extremities, anasarca, dizziness, palpitations, syncope.   GI  No heartburn, indigestion, abdominal pain, nausea, vomiting, diarrhea, change in bowel habits, loss of appetite, bloody stools.   Resp: No shortness of breath with exertion or at rest.  No excess mucus, no productive cough,  No non-productive  cough,  No coughing up of blood.  No change in color of mucus.  No wheezing.  No chest wall deformity  Skin: no rash or lesions.  GU: no dysuria, change in color of urine, no urgency or frequency.  No flank pain, no hematuria   MS:  No joint pain or swelling.  No decreased range of motion.  No back pain.    Physical Exam  BP 118/74 (BP Location: Left Arm, Cuff Size: Normal)   Pulse (!) 57   Ht 5\' 9"  (1.753 m)   Wt 234 lb 12.8 oz (106.5 kg)   SpO2 99%   BMI 34.67 kg/m   GEN: A/Ox3; pleasant , NAD, well nourished    HEENT:  [place]/AT,  EACs-clear, TMs-wnl, NOSE-clear, THROAT-clear, no lesions, no postnasal drip or exudate noted. Class 2 MP airway   NECK:  Supple w/ fair ROM; no  JVD; normal carotid impulses w/o bruits; no thyromegaly or nodules palpated; no lymphadenopathy.    RESP  Clear  P & A; w/o, wheezes/ rales/ or rhonchi. no accessory muscle use, no dullness to percussion  CARD:  RRR, no m/r/g, no peripheral edema, pulses intact, no cyanosis or clubbing.  GI:   Soft & nt; nml bowel sounds; no organomegaly or masses detected.   Musco: Warm bil, no deformities or joint swelling noted.   Neuro: alert, no focal deficits noted.    Skin: Warm, no lesions or rashes     Lab Results:  CBC    Component Value Date/Time   WBC 8.2 07/23/2015 0818   WBC 13.8 (H) 10/22/2012 2021   RBC 5.48 07/23/2015 0818   RBC 5.41 10/22/2012 2021   HGB 15.6 03/03/2015 1624   HCT 45.8 07/23/2015 0818   PLT 190 07/23/2015 0818   MCV 84 07/23/2015 0818   MCH 28.5 07/23/2015 0818   MCH 28.7 10/22/2012 2021   MCHC 34.1 07/23/2015 0818   MCHC 35.1 10/22/2012 2021   RDW 15.0 07/23/2015 0818   LYMPHSABS 2.4 07/23/2015 0818   MONOABS 1.2 (H) 10/22/2012 2021   EOSABS 0.2 07/23/2015 0818   BASOSABS 0.1 07/23/2015 0818    BMET    Component Value Date/Time   NA 139 07/23/2015 0818   K 4.5 07/23/2015 0818   CL 100 07/23/2015 0818   CO2 22 07/23/2015 0818   GLUCOSE 114 (H) 07/23/2015 0818   GLUCOSE 123 (H) 03/03/2015 1624   BUN 12 07/23/2015 0818   CREATININE 0.82 07/23/2015 0818   CALCIUM 9.5 07/23/2015 0818   GFRNONAA 99 07/23/2015 0818   GFRAA 114 07/23/2015 0818    BNP No results found for: BNP  ProBNP No results found for: PROBNP  Imaging: No results found.   Assessment & Plan:   OSA (obstructive sleep apnea) Mild OSA - unable to tolerate CPAP  Discussed several options for CPAP such as titration study, mask fitting , adjustment to pressure/humiditiy Jimmy Mathis  He declines to continue with CPAP .  Discussed oral appliance, he declines at this time. Will call back if he changes his mind.  Discussed wt loss.   Plan  Patient Instructions  We will send an  order for CPAP to be returned  If you change your mind please let us know.  Call back if your would like to be referred for oral appliance .  Work on weight loss.  Do not drive if sleepy  Follow up As needed         Rexene Edison, NP 02/04/2016

## 2016-01-31 NOTE — Patient Instructions (Signed)
We will send an order for CPAP to be returned  If you change your mind please let us know.  Call back if your would like to be referred for oral appliance .  Work on weight loss.  Do not drive if sleepy  Follow up As needed

## 2016-02-04 DIAGNOSIS — G4733 Obstructive sleep apnea (adult) (pediatric): Secondary | ICD-10-CM | POA: Insufficient documentation

## 2016-02-04 NOTE — Assessment & Plan Note (Signed)
Mild OSA - unable to tolerate CPAP  Discussed several options for CPAP such as titration study, mask fitting , adjustment to pressure/humiditiy Jimmy Mathis  He declines to continue with CPAP .  Discussed oral appliance, he declines at this time. Will call back if he changes his mind.  Discussed wt loss.   Plan  Patient Instructions  We will send an order for CPAP to be returned  If you change your mind please let us know.  Call back if your would like to be referred for oral appliance .  Work on weight loss.  Do not drive if sleepy  Follow up As needed

## 2016-02-28 ENCOUNTER — Other Ambulatory Visit: Payer: Self-pay

## 2016-03-11 ENCOUNTER — Other Ambulatory Visit (INDEPENDENT_AMBULATORY_CARE_PROVIDER_SITE_OTHER): Payer: BC Managed Care – PPO

## 2016-03-11 DIAGNOSIS — Z6835 Body mass index (BMI) 35.0-35.9, adult: Secondary | ICD-10-CM

## 2016-03-11 DIAGNOSIS — E669 Obesity, unspecified: Secondary | ICD-10-CM

## 2016-03-11 DIAGNOSIS — R972 Elevated prostate specific antigen [PSA]: Secondary | ICD-10-CM

## 2016-03-11 DIAGNOSIS — R7303 Prediabetes: Secondary | ICD-10-CM

## 2016-03-12 LAB — CBC WITH DIFFERENTIAL/PLATELET
Basophils Absolute: 0.1 10*3/uL (ref 0.0–0.2)
Basos: 1 %
EOS (ABSOLUTE): 0.1 10*3/uL (ref 0.0–0.4)
Eos: 2 %
Hematocrit: 47.1 % (ref 37.5–51.0)
Hemoglobin: 15.8 g/dL (ref 13.0–17.7)
Immature Grans (Abs): 0 10*3/uL (ref 0.0–0.1)
Immature Granulocytes: 1 %
Lymphocytes Absolute: 2 10*3/uL (ref 0.7–3.1)
Lymphs: 29 %
MCH: 28.3 pg (ref 26.6–33.0)
MCHC: 33.5 g/dL (ref 31.5–35.7)
MCV: 84 fL (ref 79–97)
Monocytes Absolute: 0.6 10*3/uL (ref 0.1–0.9)
Monocytes: 9 %
Neutrophils Absolute: 4.2 10*3/uL (ref 1.4–7.0)
Neutrophils: 58 %
Platelets: 198 10*3/uL (ref 150–379)
RBC: 5.59 x10E6/uL (ref 4.14–5.80)
RDW: 14.7 % (ref 12.3–15.4)
WBC: 7.1 10*3/uL (ref 3.4–10.8)

## 2016-03-12 LAB — CMP14+EGFR
ALT: 48 [IU]/L — ABNORMAL HIGH (ref 0–44)
AST: 22 [IU]/L (ref 0–40)
Albumin/Globulin Ratio: 2.3 — ABNORMAL HIGH (ref 1.2–2.2)
Albumin: 4.3 g/dL (ref 3.5–5.5)
Alkaline Phosphatase: 100 [IU]/L (ref 39–117)
BUN/Creatinine Ratio: 12 (ref 9–20)
BUN: 11 mg/dL (ref 6–24)
Bilirubin Total: 0.5 mg/dL (ref 0.0–1.2)
CO2: 23 mmol/L (ref 18–29)
Calcium: 9.2 mg/dL (ref 8.7–10.2)
Chloride: 101 mmol/L (ref 96–106)
Creatinine, Ser: 0.91 mg/dL (ref 0.76–1.27)
GFR calc Af Amer: 109 mL/min/{1.73_m2}
GFR calc non Af Amer: 94 mL/min/{1.73_m2}
Globulin, Total: 1.9 g/dL (ref 1.5–4.5)
Glucose: 114 mg/dL — ABNORMAL HIGH (ref 65–99)
Potassium: 4.5 mmol/L (ref 3.5–5.2)
Sodium: 141 mmol/L (ref 134–144)
Total Protein: 6.2 g/dL (ref 6.0–8.5)

## 2016-03-12 LAB — LIPID PANEL
Chol/HDL Ratio: 3.5 ratio (ref 0.0–5.0)
Cholesterol, Total: 139 mg/dL (ref 100–199)
HDL: 40 mg/dL
LDL Calculated: 71 mg/dL (ref 0–99)
Triglycerides: 138 mg/dL (ref 0–149)
VLDL Cholesterol Cal: 28 mg/dL (ref 5–40)

## 2016-03-12 LAB — PSA: Prostate Specific Ag, Serum: 4.3 ng/mL — ABNORMAL HIGH (ref 0.0–4.0)

## 2016-03-13 LAB — SPECIMEN STATUS REPORT

## 2016-03-13 LAB — HGB A1C W/O EAG: Hgb A1c MFr Bld: 6 % — ABNORMAL HIGH (ref 4.8–5.6)

## 2016-08-03 ENCOUNTER — Ambulatory Visit (INDEPENDENT_AMBULATORY_CARE_PROVIDER_SITE_OTHER): Payer: BC Managed Care – PPO | Admitting: Family Medicine

## 2016-08-03 ENCOUNTER — Encounter: Payer: Self-pay | Admitting: Family Medicine

## 2016-08-03 VITALS — BP 135/79 | HR 77 | Temp 97.4°F | Ht 69.0 in | Wt 233.4 lb

## 2016-08-03 DIAGNOSIS — E559 Vitamin D deficiency, unspecified: Secondary | ICD-10-CM

## 2016-08-03 DIAGNOSIS — R7303 Prediabetes: Secondary | ICD-10-CM

## 2016-08-03 DIAGNOSIS — Z7251 High risk heterosexual behavior: Secondary | ICD-10-CM

## 2016-08-03 DIAGNOSIS — Z1211 Encounter for screening for malignant neoplasm of colon: Secondary | ICD-10-CM | POA: Diagnosis not present

## 2016-08-03 DIAGNOSIS — R972 Elevated prostate specific antigen [PSA]: Secondary | ICD-10-CM | POA: Diagnosis not present

## 2016-08-03 NOTE — Progress Notes (Signed)
   HPI  Patient presents today here for back pain.  Patient also has questions and concerns about several other topics.  High risk sexual behavior Patient has protected and unprotected sex with men and women, he would like to be tested for HIV, syphilis, gonorrhea, and chlamydia. Denies any penile discharge or dysuria.  Back pain Started 2 days ago, right-sided low back with radiation down the right leg. No injury. States that he was lifting many things. Has not tried medications, however is improving with only heat.  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   PMH: Smoking status noted ROS: Per HPI  Objective: BP 135/79   Pulse 77   Temp (!) 97.4 F (36.3 C) (Oral)   Ht '5\' 9"'$  (1.753 m)   Wt 233 lb 6.4 oz (105.9 kg)   BMI 34.47 kg/m  Gen: NAD, alert, cooperative with exam HEENT: NCAT CV: RRR, good S1/S2, no murmur Resp: CTABL, no wheezes, non-labored Ext: No edema, warm Neuro: Alert and oriented, No gross deficits  Assessment and plan:  # Elevated PSA A symptomatic, PSA previously 4.5. Repeat today  # Sciatica Mild to moderate symptoms, improving Discussed over-the-counter NSAIDs, heat Return to clinic if needed  Offered short course of prednisone, he will call back in 3 or 4 days if this has not resolved. Plan 40 mg once daily 5 days  # High-Risk sexual behavior Checking for STI as requested. asymptomatic  # Screening for colon cancer Refer to GI for his request  D deficiency Checking levels, he continues to take over-the-counter   Orders Placed This Encounter  Procedures  . GC/Chlamydia Probe Amp  . CMP14+EGFR  . CBC with Differential/Platelet  . VITAMIN D 25 Hydroxy (Vit-D Deficiency, Fractures)  . Lipid panel  . RPR  . HIV antibody  . Ambulatory referral to Gastroenterology    Referral Priority:   Routine    Referral Type:   Consultation    Referral Reason:   Specialty Services Required      Number of Visits Requested:   [place], MD [facility] 08/03/2016, 8:50 AM

## 2016-08-03 NOTE — Patient Instructions (Signed)
Great to see you!  For sciatica, keep trying heat, also consider aleve 2 pills twice daily for 1 week.   We will send labs on mychart unless there is an adjustment needed   Sciatica Sciatica is pain, numbness, weakness, or tingling along your sciatic nerve. The sciatic nerve starts in the lower back and goes down the back of each leg. Sciatica happens when this nerve is pinched or has pressure put on it. Sciatica usually goes away on its own or with treatment. Sometimes, sciatica may keep coming back (recur). Follow these instructions at home: Medicines  Take over-the-counter and prescription medicines only as told by your doctor.  Do not drive or use heavy machinery while taking prescription pain medicine. Managing pain  If directed, put ice on the affected area. ? Put ice in a plastic bag. ? Place a towel between your skin and the bag. ? Leave the ice on for 20 minutes, 2-3 times a day.  After icing, apply heat to the affected area before you exercise or as often as told by your doctor. Use the heat source that your doctor tells you to use, such as a moist heat pack or a heating pad. ? Place a towel between your skin and the heat source. ? Leave the heat on for 20-30 minutes. ? Remove the heat if your skin turns bright red. This is especially important if you are unable to feel pain, heat, or cold. You may have a greater risk of getting burned. Activity  Return to your normal activities as told by your doctor. Ask your doctor what activities are safe for you. ? Avoid activities that make your sciatica worse.  Take short rests during the day. Rest in a lying or standing position. This is usually better than sitting to rest. ? When you rest for a long time, do some physical activity or stretching between periods of rest. ? Avoid sitting for a long time without moving. Get up and move around at least one time each hour.  Exercise and stretch regularly, as told by your doctor.  Do  not lift anything that is heavier than 10 lb (4.5 kg) while you have symptoms of sciatica. ? Avoid lifting heavy things even when you do not have symptoms. ? Avoid lifting heavy things over and over.  When you lift objects, always lift in a way that is safe for your body. To do this, you should: ? Bend your knees. ? Keep the object close to your body. ? Avoid twisting. General instructions  Use good posture. ? Avoid leaning forward when you are sitting. ? Avoid hunching over when you are standing.  Stay at a healthy weight.  Wear comfortable shoes that support your feet. Avoid wearing high heels.  Avoid sleeping on a mattress that is too soft or too hard. You might have less pain if you sleep on a mattress that is firm enough to support your back.  Keep all follow-up visits as told by your doctor. This is important. Contact a doctor if:  You have pain that: ? Wakes you up when you are sleeping. ? Gets worse when you lie down. ? Is worse than the pain you have had in the past. ? Lasts longer than 4 weeks.  You lose weight for without trying. Get help right away if:  You cannot control when you pee (urinate) or poop (have a bowel movement).  You have weakness in any of these areas and it gets worse. ?  Lower back. ? Lower belly (pelvis). ? Butt (buttocks). ? Legs.  You have redness or swelling of your back.  You have a burning feeling when you pee. This information is not intended to replace advice given to you by your health care provider. Make sure you discuss any questions you have with your health care provider. Document Released: 10/05/2007 Document Revised: 06/03/2015 Document Reviewed: 09/04/2014 Elsevier Interactive Patient Education  [employer].

## 2016-08-04 LAB — CMP14+EGFR
ALBUMIN: 4.4 g/dL (ref 3.5–5.5)
ALK PHOS: 88 IU/L (ref 39–117)
ALT: 59 IU/L — ABNORMAL HIGH (ref 0–44)
AST: 39 IU/L (ref 0–40)
Albumin/Globulin Ratio: 2 (ref 1.2–2.2)
BUN/Creatinine Ratio: 13 (ref 9–20)
BUN: 12 mg/dL (ref 6–24)
Bilirubin Total: 0.6 mg/dL (ref 0.0–1.2)
CO2: 22 mmol/L (ref 20–29)
CREATININE: 0.91 mg/dL (ref 0.76–1.27)
Calcium: 9.3 mg/dL (ref 8.7–10.2)
Chloride: 102 mmol/L (ref 96–106)
GFR calc Af Amer: 108 mL/min/{1.73_m2} (ref >59)
GFR calc non Af Amer: 93 mL/min/{1.73_m2} (ref >59)
GLUCOSE: 122 mg/dL — AB (ref 65–99)
Globulin, Total: 2.2 g/dL (ref 1.5–4.5)
Potassium: 4.5 mmol/L (ref 3.5–5.2)
Sodium: 139 mmol/L (ref 134–144)
Total Protein: 6.6 g/dL (ref 6.0–8.5)

## 2016-08-04 LAB — CBC WITH DIFFERENTIAL/PLATELET
Basophils Absolute: 0 10*3/uL (ref 0.0–0.2)
Basos: 0 %
EOS (ABSOLUTE): 0.1 10*3/uL (ref 0.0–0.4)
EOS: 1 %
HEMATOCRIT: 45.5 % (ref 37.5–51.0)
HEMOGLOBIN: 15.6 g/dL (ref 13.0–17.7)
IMMATURE GRANS (ABS): 0 10*3/uL (ref 0.0–0.1)
IMMATURE GRANULOCYTES: 0 %
LYMPHS: 29 %
Lymphocytes Absolute: 2 10*3/uL (ref 0.7–3.1)
MCH: 28.4 pg (ref 26.6–33.0)
MCHC: 34.3 g/dL (ref 31.5–35.7)
MCV: 83 fL (ref 79–97)
MONOCYTES: 9 %
Monocytes Absolute: 0.6 10*3/uL (ref 0.1–0.9)
NEUTROS PCT: 61 %
Neutrophils Absolute: 4.1 10*3/uL (ref 1.4–7.0)
Platelets: 191 10*3/uL (ref 150–379)
RBC: 5.49 x10E6/uL (ref 4.14–5.80)
RDW: 14.5 % (ref 12.3–15.4)
WBC: 6.9 10*3/uL (ref 3.4–10.8)

## 2016-08-04 LAB — LIPID PANEL
CHOLESTEROL TOTAL: 161 mg/dL (ref 100–199)
Chol/HDL Ratio: 4.1 ratio (ref 0.0–5.0)
HDL: 39 mg/dL — ABNORMAL LOW (ref >39)
LDL CALC: 89 mg/dL (ref 0–99)
TRIGLYCERIDES: 164 mg/dL — AB (ref 0–149)
VLDL CHOLESTEROL CAL: 33 mg/dL (ref 5–40)

## 2016-08-04 LAB — HIV ANTIBODY (ROUTINE TESTING W REFLEX): HIV Screen 4th Generation wRfx: NONREACTIVE

## 2016-08-04 LAB — RPR: RPR Ser Ql: NONREACTIVE

## 2016-08-04 LAB — VITAMIN D 25 HYDROXY (VIT D DEFICIENCY, FRACTURES): Vit D, 25-Hydroxy: 35.4 ng/mL (ref 30.0–100.0)

## 2016-08-07 ENCOUNTER — Encounter: Payer: Self-pay | Admitting: Family Medicine

## 2016-08-07 LAB — GC/CHLAMYDIA PROBE AMP
CHLAMYDIA, DNA PROBE: NEGATIVE
NEISSERIA GONORRHOEAE BY PCR: NEGATIVE

## 2016-08-07 NOTE — Telephone Encounter (Signed)
Called and discussed, PSA added as intended.   Laroy Apple, MD [facility] 08/07/2016, 6:38 PM

## 2016-08-09 LAB — FPSA% REFLEX
% FREE PSA: 21.4 %
PSA, FREE: 0.92 ng/mL

## 2016-08-09 LAB — PSA TOTAL (REFLEX TO FREE): Prostate Specific Ag, Serum: 4.3 ng/mL — ABNORMAL HIGH (ref 0.0–4.0)

## 2016-08-09 LAB — SPECIMEN STATUS REPORT

## 2016-08-22 ENCOUNTER — Telehealth: Payer: Self-pay | Admitting: Internal Medicine

## 2016-08-22 NOTE — Telephone Encounter (Signed)
I spoke with the pt, he said he and his pcp would prefer he have it done early. pcp has sent him to [facility], pt does not want to go to [place]. He said he had one with [place] before and would like to have it done with [place] again. I have filled out the triage information.

## 2016-08-22 NOTE — Telephone Encounter (Signed)
[phone] patient called and stated his PCP said he was "high risk" for colon cancer, his grandfather and 2 uncles had it and they told him that he should have a tcs every 5 years.  Patient has bcbs and is having no issues, was told at his last colonoscopy by [facility] that he should have one in 10 years.  Please advise patient

## 2016-08-22 NOTE — Telephone Encounter (Signed)
Gastroenterology Pre-Procedure Review  Request Date:08/22/16 Requesting Physician: [facility]   PATIENT REVIEW QUESTIONS: The patient responded to the following health history questions as indicated:    1. Diabetes Melitis: no 2. Joint replacements in the past 12 months: no 3. Major health problems in the past 3 months: no 4. Has an artificial valve or MVP: no 5. Has a defibrillator: no 6. Has been advised in past to take antibiotics in advance of a procedure like teeth cleaning: no 7. Family history of colon cancer: yes  8. Alcohol Use: yes- every once in awhile, not often 9. History of sleep apnea: yes, does not use a cpap  10. History of coronary artery or other vascular stents placed within the last 12 months: no 11. History of any prior anesthesia complications: no    MEDICATIONS & ALLERGIES:    Patient reports the following regarding taking any blood thinners:   Plavix? no Aspirin? Yes 81mg  Coumadin? no Brilinta? no Xarelto? no Eliquis? no Pradaxa? no Savaysa? no Effient? no  Patient confirms/reports the following medications:  Current Outpatient Prescriptions  Medication Sig Dispense Refill  . aspirin 81 MG tablet Take 81 mg by mouth daily.      . Cholecalciferol (VITAMIN D) 2000 units CAPS Take 2,000 Units by mouth daily.      No current facility-administered medications for this visit.     Patient confirms/reports the following allergies:  Allergies  Allergen Reactions  . Penicillins Nausea And Vomiting and Swelling    Has patient had a PCN reaction causing immediate rash, facial/tongue/throat swelling, SOB or lightheadedness with hypotension: No Has patient had a PCN reaction causing severe rash involving mucus membranes or skin necrosis: No Has patient had a PCN reaction that required hospitalization No Has patient had a PCN reaction occurring within the last 10 years: No If all of the above answers are "NO", then may proceed with Cephalosporin use.   . Procaine  Hcl Swelling  . Levaquin [Levofloxacin In D5w] Other (See Comments)    Caused pain in tendon(s)   . Minocycline Other (See Comments)    headache    No orders of the defined types were placed in this encounter.   AUTHORIZATION INFORMATION Primary Insurance: ,  ID #: ,  Group #:  Pre-Cert / Auth required:  Pre-Cert / Auth #:  Secondary Insurance: ,  ID #:   Group #:  Pre-Cert / Auth required:  Pre-Cert / Auth #:   SCHEDULE INFORMATION: Procedure has been scheduled as follows:  Date: , Time:  Location:   This Gastroenterology Pre-Precedure Review Form is being routed to the following provider(s): Dr.Rourk.

## 2016-08-22 NOTE — Telephone Encounter (Signed)
Okay, go ahead and sign him up for colonoscopy-conscious sedation

## 2016-08-22 NOTE — Telephone Encounter (Signed)
Dr.Rourk, pt last tcs was 06/21/10 and I have copied and pasted the impression from the procedure note:   IMPRESSION:  Normal rectum, colon, and terminal ileum.  RECOMMENDATIONS:  Repeat screening colonoscopy in 10 years.  Do you think the pt needs to have it repeated sooner d/t family history?

## 2016-08-22 NOTE — Telephone Encounter (Signed)
The good news for him  -  we're talking all about second-degree relatives. 10 years is still okay. However, the PCP deems him at increased risk and patient is uncomfortable, we can do him at a 5 year interval.

## 2016-08-23 ENCOUNTER — Other Ambulatory Visit: Payer: Self-pay

## 2016-08-23 DIAGNOSIS — Z1211 Encounter for screening for malignant neoplasm of colon: Secondary | ICD-10-CM

## 2016-08-23 MED ORDER — CLENPIQ 10-3.5-12 MG-GM -GM/160ML PO SOLN: 1.0000 | Freq: Once | ORAL | 0 refills | Status: AC

## 2016-08-23 NOTE — Telephone Encounter (Signed)
Pt is set up for TCS on 09/27/16 @ 8:30 am. He is aware and instructions are in the mail

## 2016-08-23 NOTE — Telephone Encounter (Signed)
NO PA is needed for TCS 

## 2016-08-23 NOTE — Telephone Encounter (Signed)
Ginger, can you schedule this pt for a tcs please.  He has already been triaged.

## 2016-09-27 ENCOUNTER — Encounter (HOSPITAL_COMMUNITY)
Admission: RE | Disposition: A | Discharge status: Home / Self Care | Payer: Self-pay | Source: Ambulatory Visit | Attending: Internal Medicine

## 2016-09-27 ENCOUNTER — Encounter (HOSPITAL_COMMUNITY): Payer: Self-pay | Admitting: *Deleted

## 2016-09-27 ENCOUNTER — Ambulatory Visit (HOSPITAL_COMMUNITY)
Admission: RE | Admit: 2016-09-27 | Discharge: 2016-09-27 | Disposition: A | Discharge status: Home / Self Care | Payer: BC Managed Care – PPO | Source: Ambulatory Visit | Attending: Internal Medicine | Admitting: Internal Medicine

## 2016-09-27 DIAGNOSIS — E119 Type 2 diabetes mellitus without complications: Secondary | ICD-10-CM | POA: Insufficient documentation

## 2016-09-27 DIAGNOSIS — F419 Anxiety disorder, unspecified: Secondary | ICD-10-CM | POA: Diagnosis not present

## 2016-09-27 DIAGNOSIS — D125 Benign neoplasm of sigmoid colon: Secondary | ICD-10-CM | POA: Diagnosis not present

## 2016-09-27 DIAGNOSIS — K5730 Diverticulosis of large intestine without perforation or abscess without bleeding: Secondary | ICD-10-CM | POA: Diagnosis not present

## 2016-09-27 DIAGNOSIS — Z1211 Encounter for screening for malignant neoplasm of colon: Secondary | ICD-10-CM | POA: Diagnosis not present

## 2016-09-27 DIAGNOSIS — Z79899 Other long term (current) drug therapy: Secondary | ICD-10-CM | POA: Diagnosis not present

## 2016-09-27 DIAGNOSIS — K635 Polyp of colon: Secondary | ICD-10-CM | POA: Diagnosis not present

## 2016-09-27 DIAGNOSIS — Z87891 Personal history of nicotine dependence: Secondary | ICD-10-CM | POA: Insufficient documentation

## 2016-09-27 DIAGNOSIS — G4730 Sleep apnea, unspecified: Secondary | ICD-10-CM | POA: Insufficient documentation

## 2016-09-27 DIAGNOSIS — Z7982 Long term (current) use of aspirin: Secondary | ICD-10-CM | POA: Insufficient documentation

## 2016-09-27 HISTORY — PX: POLYPECTOMY: SHX5525

## 2016-09-27 HISTORY — DX: Anxiety disorder, unspecified: F41.9

## 2016-09-27 HISTORY — DX: Sleep apnea, unspecified: G47.30

## 2016-09-27 HISTORY — PX: COLONOSCOPY: SHX5424

## 2016-09-27 LAB — GLUCOSE, CAPILLARY: Glucose-Capillary: 136 mg/dL — ABNORMAL HIGH (ref 65–99)

## 2016-09-27 SURGERY — COLONOSCOPY
Anesthesia: Moderate Sedation

## 2016-09-27 MED ORDER — STERILE WATER FOR IRRIGATION IR SOLN
Status: DC | PRN
  Administered 2016-09-27: 09:00:00

## 2016-09-27 MED ORDER — MEPERIDINE HCL 100 MG/ML IJ SOLN
INTRAMUSCULAR | Status: DC | PRN
  Administered 2016-09-27 (×2): 50 mg via INTRAVENOUS

## 2016-09-27 MED ORDER — SODIUM CHLORIDE 0.9 % IV SOLN
INTRAVENOUS | Status: DC
  Administered 2016-09-27: 08:00:00 via INTRAVENOUS

## 2016-09-27 MED ORDER — MEPERIDINE HCL 100 MG/ML IJ SOLN
INTRAMUSCULAR | Status: AC
  Filled 2016-09-27: qty 2

## 2016-09-27 MED ORDER — MIDAZOLAM HCL 5 MG/5ML IJ SOLN
INTRAMUSCULAR | Status: AC
  Filled 2016-09-27: qty 10

## 2016-09-27 MED ORDER — MIDAZOLAM HCL 5 MG/5ML IJ SOLN
INTRAMUSCULAR | Status: DC | PRN
  Administered 2016-09-27: 2 mg via INTRAVENOUS
  Administered 2016-09-27: 1 mg via INTRAVENOUS
  Administered 2016-09-27: 2 mg via INTRAVENOUS

## 2016-09-27 MED ORDER — ONDANSETRON HCL 4 MG/2ML IJ SOLN
INTRAMUSCULAR | Status: AC
  Filled 2016-09-27: qty 2

## 2016-09-27 MED ORDER — ONDANSETRON HCL 4 MG/2ML IJ SOLN
INTRAMUSCULAR | Status: DC | PRN
  Administered 2016-09-27: 4 mg via INTRAVENOUS

## 2016-09-27 NOTE — Discharge Instructions (Signed)
°Colonoscopy °Discharge Instructions ° °Read the instructions outlined below and refer to this sheet in the next few weeks. These discharge instructions provide you with general information on caring for yourself after you leave the hospital. Your doctor may also give you specific instructions. While your treatment has been planned according to the most current medical practices available, unavoidable complications occasionally occur. If you have any problems or questions after discharge, call Dr. Rourk at [phone]. °ACTIVITY °· You may resume your regular activity, but move at a slower pace for the next 24 hours.  °· Take frequent rest periods for the next 24 hours.  °· Walking will help get rid of the air and reduce the bloated feeling in your belly (abdomen).  °· No driving for 24 hours (because of the medicine (anesthesia) used during the test).   °· Do not sign any important legal documents or operate any machinery for 24 hours (because of the anesthesia used during the test).  °NUTRITION °· Drink plenty of fluids.  °· You may resume your normal diet as instructed by your doctor.  °· Begin with a light meal and progress to your normal diet. Heavy or fried foods are harder to digest and may make you feel sick to your stomach (nauseated).  °· Avoid alcoholic beverages for 24 hours or as instructed.  °MEDICATIONS °· You may resume your normal medications unless your doctor tells you otherwise.  °WHAT YOU CAN EXPECT TODAY °· Some feelings of bloating in the abdomen.  °· Passage of more gas than usual.  °· Spotting of blood in your stool or on the toilet paper.  °IF YOU HAD POLYPS REMOVED DURING THE COLONOSCOPY: °· No aspirin products for 7 days or as instructed.  °· No alcohol for 7 days or as instructed.  °· Eat a soft diet for the next 24 hours.  °FINDING OUT THE RESULTS OF YOUR TEST °Not all test results are available during your visit. If your test results are not back during the visit, make an appointment  with your caregiver to find out the results. Do not assume everything is normal if you have not heard from your caregiver or the medical facility. It is important for you to follow up on all of your test results.  °SEEK IMMEDIATE MEDICAL ATTENTION IF: °· You have more than a spotting of blood in your stool.  °· Your belly is swollen (abdominal distention).  °· You are nauseated or vomiting.  °· You have a temperature over 101.  °· You have abdominal pain or discomfort that is severe or gets worse throughout the day.  ° °Colon Polyps °Polyps are tissue growths inside the body. Polyps can grow in many places, including the large intestine (colon). A polyp may be a round bump or a mushroom-shaped growth. You could have one polyp or several. °Most colon polyps are noncancerous (benign). However, some colon polyps can become cancerous over time. °What are the causes? °The exact cause of colon polyps is not known. °What increases the risk? °This condition is more likely to develop in people who: °· Have a family history of colon cancer or colon polyps. °· Are older than 50 or older than 45 if they are African American. °· Have inflammatory bowel disease, such as ulcerative colitis or Crohn disease. °· Are overweight. °· Smoke cigarettes. °· Do not get enough exercise. °· Drink too much alcohol. °· Eat a diet that is: °? High in fat and red meat. °? Low in fiber. °·   Had childhood cancer that was treated with abdominal radiation. ° °What are the signs or symptoms? °Most polyps do not cause symptoms. If you have symptoms, they may include: °· Blood coming from your rectum when having a bowel movement. °· Blood in your stool. The stool may look dark red or black. °· A change in bowel habits, such as constipation or diarrhea. ° °How is this diagnosed? °This condition is diagnosed with a colonoscopy. This is a procedure that uses a lighted, flexible scope to look at the inside of your colon. °How is this treated? °Treatment for  this condition involves removing any polyps that are found. Those polyps will then be tested for cancer. If cancer is found, your health care provider will talk to you about options for colon cancer treatment. °Follow these instructions at home: °Diet °· Eat plenty of fiber, such as fruits, vegetables, and whole grains. °· Eat foods that are high in calcium and vitamin D, such as milk, cheese, yogurt, eggs, liver, fish, and broccoli. °· Limit foods high in fat, red meats, and processed meats, such as hot dogs, sausage, bacon, and lunch meats. °· Maintain a healthy weight, or lose weight if recommended by your health care provider. °General instructions °· Do not smoke cigarettes. °· Do not drink alcohol excessively. °· Keep all follow-up visits as told by your health care provider. This is important. This includes keeping regularly scheduled colonoscopies. Talk to your health care provider about when you need a colonoscopy. °· Exercise every day or as told by your health care provider. °Contact a health care provider if: °· You have new or worsening bleeding during a bowel movement. °· You have new or increased blood in your stool. °· You have a change in bowel habits. °· You unexpectedly lose weight. °This information is not intended to replace advice given to you by your health care provider. Make sure you discuss any questions you have with your health care provider. °Document Released: 09/22/2003 Document Revised: 06/03/2015 Document Reviewed: 11/16/2014 °Elsevier Interactive Patient Education © 2018 [employer] ° °Diverticulosis °Diverticulosis is a condition that develops when small pouches (diverticula) form in the wall of the large intestine (colon). The colon is where water is absorbed and stool is formed. The pouches form when the inside layer of the colon pushes through weak spots in the outer layers of the colon. You may have a few pouches or many of them. °What are the causes? °The cause of this  condition is not known. °What increases the risk? °The following factors may make you more likely to develop this condition: °· Being older than age 60. Your risk for this condition increases with age. Diverticulosis is rare among people younger than age 30. By age 80, many people have it. °· Eating a low-fiber diet. °· Having frequent constipation. °· Being overweight. °· Not getting enough exercise. °· Smoking. °· Taking over-the-counter pain medicines, like aspirin and ibuprofen. °· Having a family history of diverticulosis. ° °What are the signs or symptoms? °In most people, there are no symptoms of this condition. If you do have symptoms, they may include: °· Bloating. °· Cramps in the abdomen. °· Constipation or diarrhea. °· Pain in the lower left side of the abdomen. ° °How is this diagnosed? °This condition is most often diagnosed during an exam for other colon problems. Because diverticulosis usually has no symptoms, it often cannot be diagnosed independently. This condition may be diagnosed by: °· Using a flexible scope to examine the   colon (colonoscopy). °· Taking an X-ray of the colon after dye has been put into the colon (barium enema). °· Doing a CT scan. ° °How is this treated? °You may not need treatment for this condition if you have never developed an infection related to diverticulosis. If you have had an infection before, treatment may include: °· Eating a high-fiber diet. This may include eating more fruits, vegetables, and grains. °· Taking a fiber supplement. °· Taking a live bacteria supplement (probiotic). °· Taking medicine to relax your colon. °· Taking antibiotic medicines. ° °Follow these instructions at home: °· Drink 6-8 glasses of water or more each day to prevent constipation. °· Try not to strain when you have a bowel movement. °· If you have had an infection before: °? Eat more fiber as directed by your health care provider or your diet and nutrition specialist (dietitian). °? Take  a fiber supplement or probiotic, if your health care provider approves. °· Take over-the-counter and prescription medicines only as told by your health care provider. °· If you were prescribed an antibiotic, take it as told by your health care provider. Do not stop taking the antibiotic even if you start to feel better. °· Keep all follow-up visits as told by your health care provider. This is important. °Contact a health care provider if: °· You have pain in your abdomen. °· You have bloating. °· You have cramps. °· You have not had a bowel movement in 3 days. °Get help right away if: °· Your pain gets worse. °· Your bloating becomes very bad. °· You have a fever or chills, and your symptoms suddenly get worse. °· You vomit. °· You have bowel movements that are bloody or black. °· You have bleeding from your rectum. °Summary °· Diverticulosis is a condition that develops when small pouches (diverticula) form in the wall of the large intestine (colon). °· You may have a few pouches or many of them. °· This condition is most often diagnosed during an exam for other colon problems. °· If you have had an infection related to diverticulosis, treatment may include increasing the fiber in your diet, taking supplements, or taking medicines. °This information is not intended to replace advice given to you by your health care provider. Make sure you discuss any questions you have with your health care provider. °Document Released: 09/23/2003 Document Revised: 11/15/2015 Document Reviewed: 11/15/2015 °Elsevier Interactive Patient Education © 2017 Elsevier [employer]. ° ° °Colon polyp and diverticulosis information provided ° °Further recommendations to follow pending review of pathology report ° °

## 2016-09-27 NOTE — H&P (Signed)
@LOGO @   Primary Care Physician:  Timmothy Euler, MD Primary Gastroenterologist:  Dr. Gala Romney  Pre-Procedure History & Physical: HPI:  Jimmy Mathis is a 56 y.o. male is here for a screening colonoscopy.  No bowel symptoms. No family history of colon cancer in any first-degree relatives colon cancer but positive in 3 second-degree relatives, however. Colonoscopy being done as a screening maneuver.  Past Medical History:  Diagnosis Date  . Anxiety   . Blood transfusion without reported diagnosis   . Diabetes mellitus without complication ([place])   . Sleep apnea     Past Surgical History:  Procedure Laterality Date  . CHOLECYSTECTOMY    . COLONOSCOPY    . TENDON REPAIR     right hand  . TONSILLECTOMY      Prior to Admission medications   Medication Sig Start Date End Date Taking? Authorizing Provider  aspirin 81 MG tablet Take 81 mg by mouth daily.     Yes [provider]  calcium carbonate (TUMS - DOSED IN MG ELEMENTAL CALCIUM) 500 MG chewable tablet Chew 1-2 tablets by mouth daily as needed for indigestion or heartburn.   Yes [provider]  Cholecalciferol (VITAMIN D) 2000 units CAPS Take 2,000 Units by mouth daily.    Yes [provider]  Multiple Vitamin (MULTIVITAMIN WITH MINERALS) TABS tablet Take 1 tablet by mouth daily.   Yes [provider]    Allergies as of 08/23/2016 - Review Complete 08/03/2016  Allergen Reaction Noted  . Penicillins Nausea And Vomiting and Swelling   . Procaine hcl Swelling   . Levaquin [levofloxacin in d5w] Other (See Comments) 10/22/2012  . Minocycline Other (See Comments) 08/12/2015    Family History  Problem Relation Age of Onset  . Cancer Father        lung  . Diabetes Father   . Colon cancer Unknown   . Colon cancer Paternal Uncle   . Colon cancer Maternal Grandfather   . Diabetes Maternal Grandmother     Social History   Social History  . Marital status: Single    Spouse name: N/A  .  Number of children: N/A  . Years of education: N/A   Occupational History  . Not on file.   Social History Main Topics  . Smoking status: Former Smoker    Packs/day: 1.50    Years: 12.00  . Smokeless tobacco: Never Used     Comment: quit smoking at age 54  . Alcohol use Yes     Comment: occasionally  . Drug use: No  . Sexual activity: Not on file   Other Topics Concern  . Not on file   Social History Narrative  . No narrative on file    Review of Systems: See HPI, otherwise negative ROS  Physical Exam: BP (!) 162/101   Pulse 82   Temp 98.2 F (36.8 C) (Oral)   Resp 17   Ht 5\' 9"  (1.753 m)   Wt 230 lb (104.3 kg)   SpO2 98%   BMI 33.97 kg/m  General:   Alert,  Well-developed, well-nourished, pleasant and cooperative in NAD Head:  Normocephalic and atraumatic. Lungs:  Clear throughout to auscultation.   No wheezes, crackles, or rhonchi. No acute distress. Heart:  Regular rate and rhythm; no murmurs, clicks, rubs,  or gallops. Abdomen:  Soft, nontender and nondistended. No masses, hepatosplenomegaly or hernias noted. Normal bowel sounds, without guarding, and without rebound.    Impression/Plan: Jimmy Mathis is now  here to undergo a screening colonoscopy.  Risks, benefits, limitations, imponderables and alternatives regarding colonoscopy have been reviewed with the patient. Questions have been answered. All parties agreeable.     Notice:  This dictation was prepared with Dragon dictation along with smaller phrase technology. Any transcriptional errors that result from this process are unintentional and may not be corrected upon review.

## 2016-09-27 NOTE — Op Note (Signed)
[facility] Patient Name: Jimmy Mathis Procedure Date: 09/27/2016 8:14 AM MRN: [id number] Date of Birth: 05-02-1960 Attending MD: Norvel Richards , MD CSN: [id number] Age: 56 Admit Type: Outpatient Procedure:                Colonoscopy Indications:              Screening for colorectal malignant neoplasm Providers:                Norvel Richards, MD, Jeanann Lewandowsky. Sharon Seller, RN,                            Randa Spike, Technician Referring MD:              Medicines:                Midazolam 5 mg IV, Meperidine 546 mg IV Complications:            No immediate complications. Estimated Blood Loss:     Estimated blood loss was minimal. Procedure:                Pre-Anesthesia Assessment:                           - Prior to the procedure, a History and Physical                            was performed, and patient medications and                            allergies were reviewed. The patient's tolerance of                            previous anesthesia was also reviewed. The risks                            and benefits of the procedure and the sedation                            options and risks were discussed with the patient.                            All questions were answered, and informed consent                            was obtained. Prior Anticoagulants: The patient has                            taken no previous anticoagulant or antiplatelet                            agents. ASA Grade Assessment: II - A patient with                            mild systemic disease. After reviewing the risks  and benefits, the patient was deemed in                            satisfactory condition to undergo the procedure.                           After obtaining informed consent, the colonoscope                            was passed under direct vision. Throughout the                            procedure, the patient's blood pressure, pulse, and                            oxygen saturations were monitored continuously. The                            EC-[id number] ([id number]) scope was introduced through                            the anus and advanced to the the cecum, identified                            by appendiceal orifice and ileocecal valve. The                            ileocecal valve, appendiceal orifice, and rectum                            were photographed. The entire colon was well                            visualized. The quality of the bowel preparation                            was adequate. Scope In: 8:52:35 AM Scope Out: 9:10:20 AM Scope Withdrawal Time: 0 hours 14 minutes 55 seconds  Total Procedure Duration: 0 hours 17 minutes 45 seconds  Findings:      The perianal and digital rectal examinations were normal.      Scattered small and large-mouthed diverticula were found in the sigmoid       colon, descending colon and transverse colon.      Two sessile polyps were found in the sigmoid colon. The polyps were 4 to       5 mm in size. These polyps were removed with a cold snare. Resection and       retrieval were complete. Estimated blood loss was minimal. Impression:               - Diverticulosis in the sigmoid colon, in the                            descending colon and in the transverse colon.                           -  Two 4 to 5 mm polyps in the sigmoid colon,                            removed with a cold snare. Resected and retrieved. Moderate Sedation:      Moderate (conscious) sedation was administered by the endoscopy nurse       and supervised by the endoscopist. The following parameters were       monitored: oxygen saturation, heart rate, blood pressure, respiratory       rate, EKG, adequacy of pulmonary ventilation, and response to care.       Total physician intraservice time was 27 minutes. Recommendation:           - Patient has a contact number available for                             emergencies. The signs and symptoms of potential                            delayed complications were discussed with the                            patient. Return to normal activities tomorrow.                            Written discharge instructions were provided to the                            patient.                           - Resume previous diet.                           - Continue present medications.                           - Repeat colonoscopy date to be determined after                            pending pathology results are reviewed for                            surveillance based on pathology results.                           - Return to GI clinic after studies are complete. Procedure Code(s):        --- Professional ---                           [id number], Colonoscopy, flexible; with removal of                            tumor(s), polyp(s), or other lesion(s) by snare  technique                           [phone], Moderate sedation services provided by the                            same physician or other qualified health care                            professional performing the diagnostic or                            therapeutic service that the sedation supports,                            requiring the presence of an independent trained                            observer to assist in the monitoring of the                            patient's level of consciousness and physiological                            status; initial 15 minutes of intraservice time,                            patient age 24 years or older                           [phone], Moderate sedation services; each additional                            15 minutes intraservice time Diagnosis Code(s):        --- Professional ---                           Z12.11, Encounter for screening for malignant                            neoplasm of colon                           D12.5,  Benign neoplasm of sigmoid colon                           K57.30, Diverticulosis of large intestine without                            perforation or abscess without bleeding CPT copyright 2016 American Medical Association. All rights reserved. The codes documented in this report are preliminary and upon coder review may  be revised to meet current compliance requirements. Cristopher Estimable. Jamond Neels, MD Norvel Richards, MD 09/27/2016 9:24:03 AM This report has been signed electronically. Number of Addenda: 0

## 2016-09-28 NOTE — Telephone Encounter (Signed)
Patient has been seen by [facility]. Records have been shredded.

## 2016-09-29 ENCOUNTER — Encounter (HOSPITAL_COMMUNITY): Payer: Self-pay | Admitting: Internal Medicine

## 2016-09-30 ENCOUNTER — Encounter: Payer: Self-pay | Admitting: Internal Medicine

## 2016-10-04 ENCOUNTER — Ambulatory Visit (INDEPENDENT_AMBULATORY_CARE_PROVIDER_SITE_OTHER): Payer: BC Managed Care – PPO

## 2016-10-04 DIAGNOSIS — Z23 Encounter for immunization: Secondary | ICD-10-CM

## 2016-11-29 ENCOUNTER — Ambulatory Visit: Payer: BC Managed Care – PPO | Admitting: Family Medicine

## 2016-11-29 ENCOUNTER — Encounter: Payer: Self-pay | Admitting: Family Medicine

## 2016-11-29 VITALS — BP 132/66 | HR 90 | Temp 98.3°F | Ht 69.0 in | Wt 237.0 lb

## 2016-11-29 DIAGNOSIS — J189 Pneumonia, unspecified organism: Secondary | ICD-10-CM

## 2016-11-29 DIAGNOSIS — R7303 Prediabetes: Secondary | ICD-10-CM | POA: Diagnosis not present

## 2016-11-29 LAB — BAYER DCA HB A1C WAIVED: HB A1C (BAYER DCA - WAIVED): 6.7 % (ref <7.0)

## 2016-11-29 MED ORDER — ALBUTEROL SULFATE HFA 108 (90 BASE) MCG/ACT IN AERS: 2.0000 | INHALATION_SPRAY | Freq: Four times a day (QID) | RESPIRATORY_TRACT | 0 refills | Status: DC | PRN

## 2016-11-29 MED ORDER — AZITHROMYCIN 250 MG PO TABS: ORAL_TABLET | ORAL | 0 refills | Status: DC

## 2016-11-29 MED ORDER — METHYLPREDNISOLONE ACETATE 80 MG/ML IJ SUSP
80.0000 mg | Freq: Once | INTRAMUSCULAR | Status: AC
  Administered 2016-11-29: 80 mg via INTRAMUSCULAR

## 2016-11-29 NOTE — Progress Notes (Signed)
BP 132/66   Pulse 90   Temp 98.3 F (36.8 C) (Oral)   Ht 5\' 9"  (1.753 m)   Wt 237 lb (107.5 kg)   SpO2 97%   BMI 35.00 kg/m    Subjective:    Patient ID: Jimmy Mathis, male    DOB: 1960-05-11, 56 y.o.   MRN: [id number]  HPI: Jimmy Mathis is a 56 y.o. male presenting on 11/29/2016 for Cough (x 1 week OTC mucinex) and Nasal Congestion   HPI Cough and chest congestion Patient comes in complaining of cough and chest congestion that is been going on for just over a week.  He says is been using over-the-counter Mucinex and trying to get better on his own but just does not seem to he denies any fevers or chills over 99 but says this is how he had it last time when he ended up getting walking pneumonia.  He denies any sick contacts that he knows of.  His cough is been productive of yellow-green sputum.  Relevant past medical, surgical, family and social history reviewed and updated as indicated. Interim medical history since our last visit reviewed. Allergies and medications reviewed and updated.  Review of Systems  Constitutional: Negative for chills and fever.  HENT: Positive for congestion, postnasal drip, rhinorrhea, sinus pressure and sore throat. Negative for ear discharge, ear pain, sneezing and voice change.   Eyes: Negative for pain, discharge, redness and visual disturbance.  Respiratory: Positive for cough. Negative for shortness of breath and wheezing.   Cardiovascular: Negative for chest pain and leg swelling.  Musculoskeletal: Negative for gait problem.  Skin: Negative for rash.  All other systems reviewed and are negative.   Per HPI unless specifically indicated above     Objective:    BP 132/66   Pulse 90   Temp 98.3 F (36.8 C) (Oral)   Ht 5\' 9"  (1.753 m)   Wt 237 lb (107.5 kg)   SpO2 97%   BMI 35.00 kg/m   Wt Readings from Last 3 Encounters:  11/29/16 237 lb (107.5 kg)  09/27/16 230 lb (104.3 kg)  08/03/16 233 lb 6.4 oz (105.9 kg)    Physical  Exam  Constitutional: He is oriented to person, place, and time. He appears well-developed and well-nourished. No distress.  HENT:  Right Ear: Tympanic membrane, external ear and ear canal normal.  Left Ear: Tympanic membrane, external ear and ear canal normal.  Nose: Mucosal edema and rhinorrhea present. No sinus tenderness. No epistaxis. Right sinus exhibits no maxillary sinus tenderness and no frontal sinus tenderness. Left sinus exhibits no maxillary sinus tenderness and no frontal sinus tenderness.  Mouth/Throat: Uvula is midline and mucous membranes are normal. Posterior oropharyngeal edema and posterior oropharyngeal erythema present. No oropharyngeal exudate or tonsillar abscesses.  Eyes: Conjunctivae and EOM are normal. Pupils are equal, round, and reactive to light. No scleral icterus.  Neck: Neck supple. No thyromegaly present.  Cardiovascular: Normal rate, regular rhythm, normal heart sounds and intact distal pulses.  No murmur heard. Pulmonary/Chest: Effort normal and breath sounds normal. No respiratory distress. He has no wheezes. He has no rales.  Musculoskeletal: Normal range of motion. He exhibits no edema.  Lymphadenopathy:    He has no cervical adenopathy.  Neurological: He is alert and oriented to person, place, and time. Coordination normal.  Skin: Skin is warm and dry. No rash noted. He is not diaphoretic.  Psychiatric: He has a normal mood and affect. His behavior is normal.  Nursing note and vitals reviewed.  A1c of 6.7, will follow up with his PCP on this    Assessment & Plan:   Problem List Items Addressed This Visit      Other   Pre-diabetes   Relevant Orders   Bayer DCA Hb A1c Waived    Other Visit Diagnoses    Atypical pneumonia    -  Primary   Relevant Medications   azithromycin (ZITHROMAX) 250 MG tablet   albuterol (PROVENTIL HFA;VENTOLIN HFA) 108 (90 Base) MCG/ACT inhaler   methylPREDNISolone acetate (DEPO-MEDROL) injection 80 mg (Start on  11/29/2016 10:45 AM)       Follow up plan: Return if symptoms worsen or fail to improve.  Counseling provided for all of the vaccine components Orders Placed This Encounter  Procedures  . [facility] Hb A1c Waived    Caryl Pina, MD [facility] 11/29/2016, 10:28 AM

## 2017-04-26 IMAGING — DX DG CHEST 2V
2 series · 2 of 2 positions shown · non-contrast
Comparison: 07/18/2015

CLINICAL DATA: Cough for 3 days

EXAM:
CHEST  2 VIEW

[chest pa]
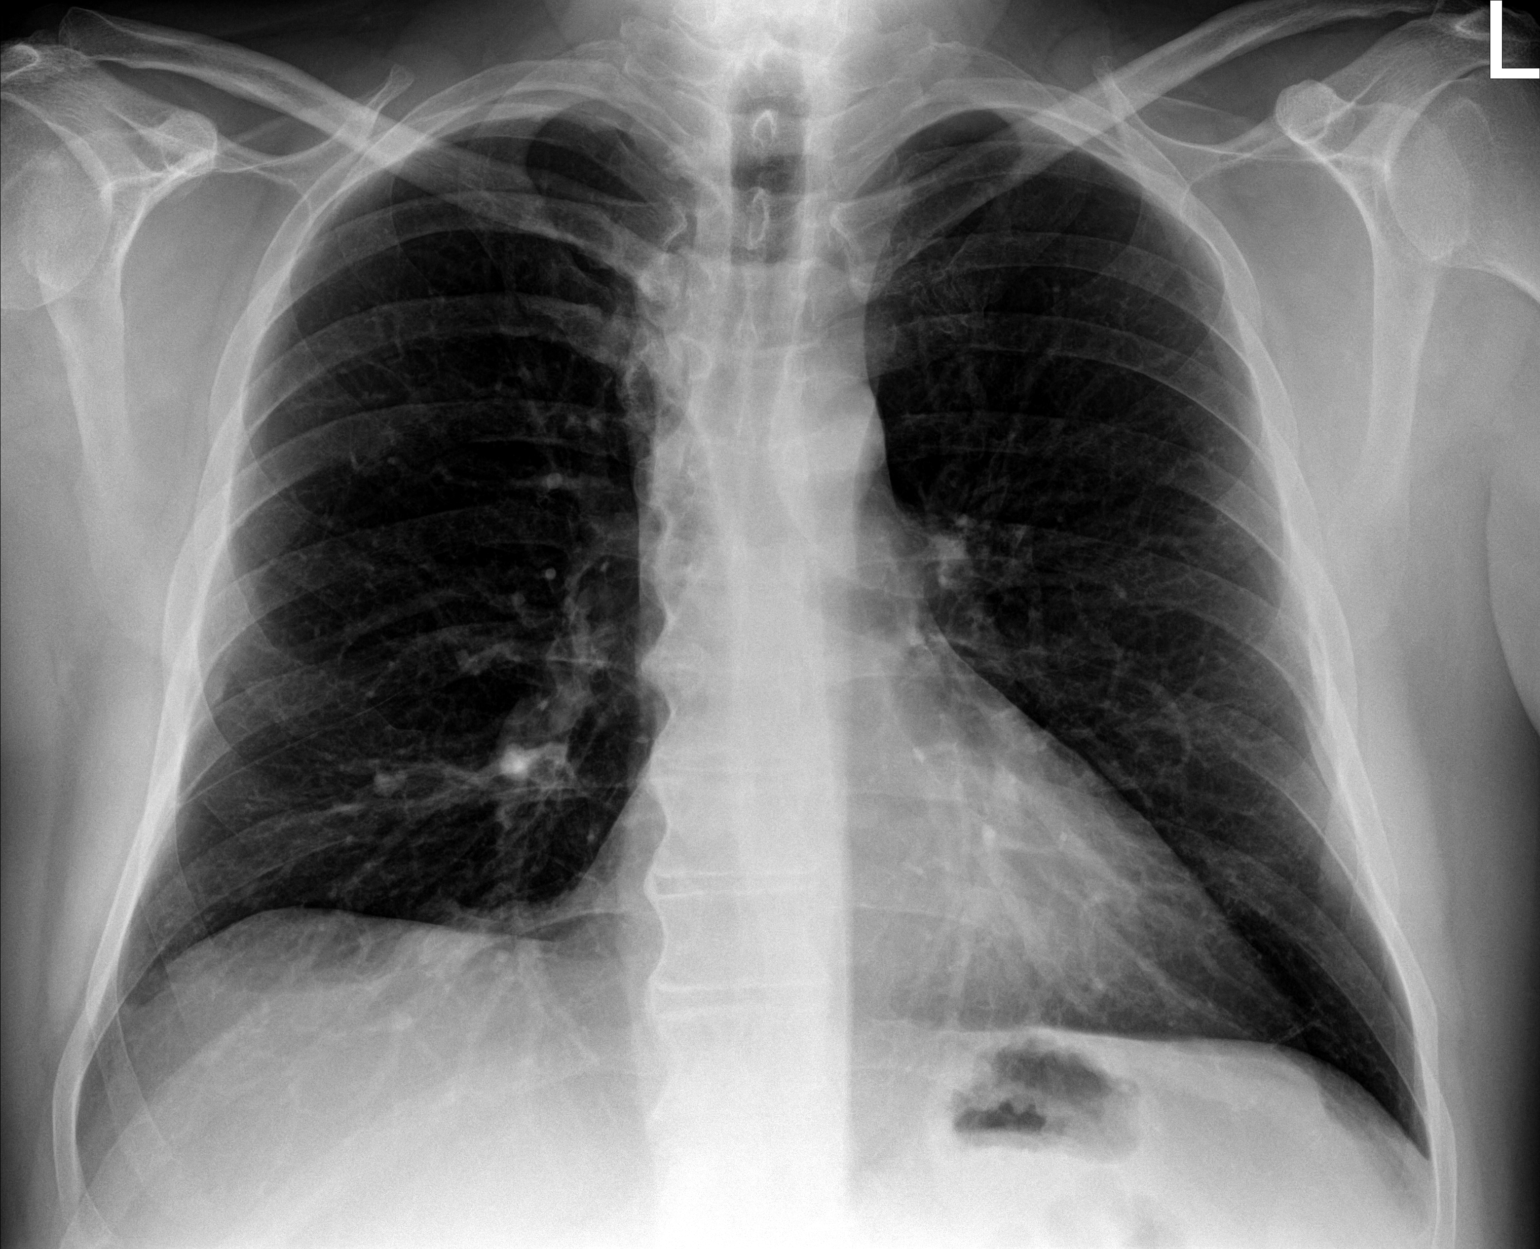

[chest lat]
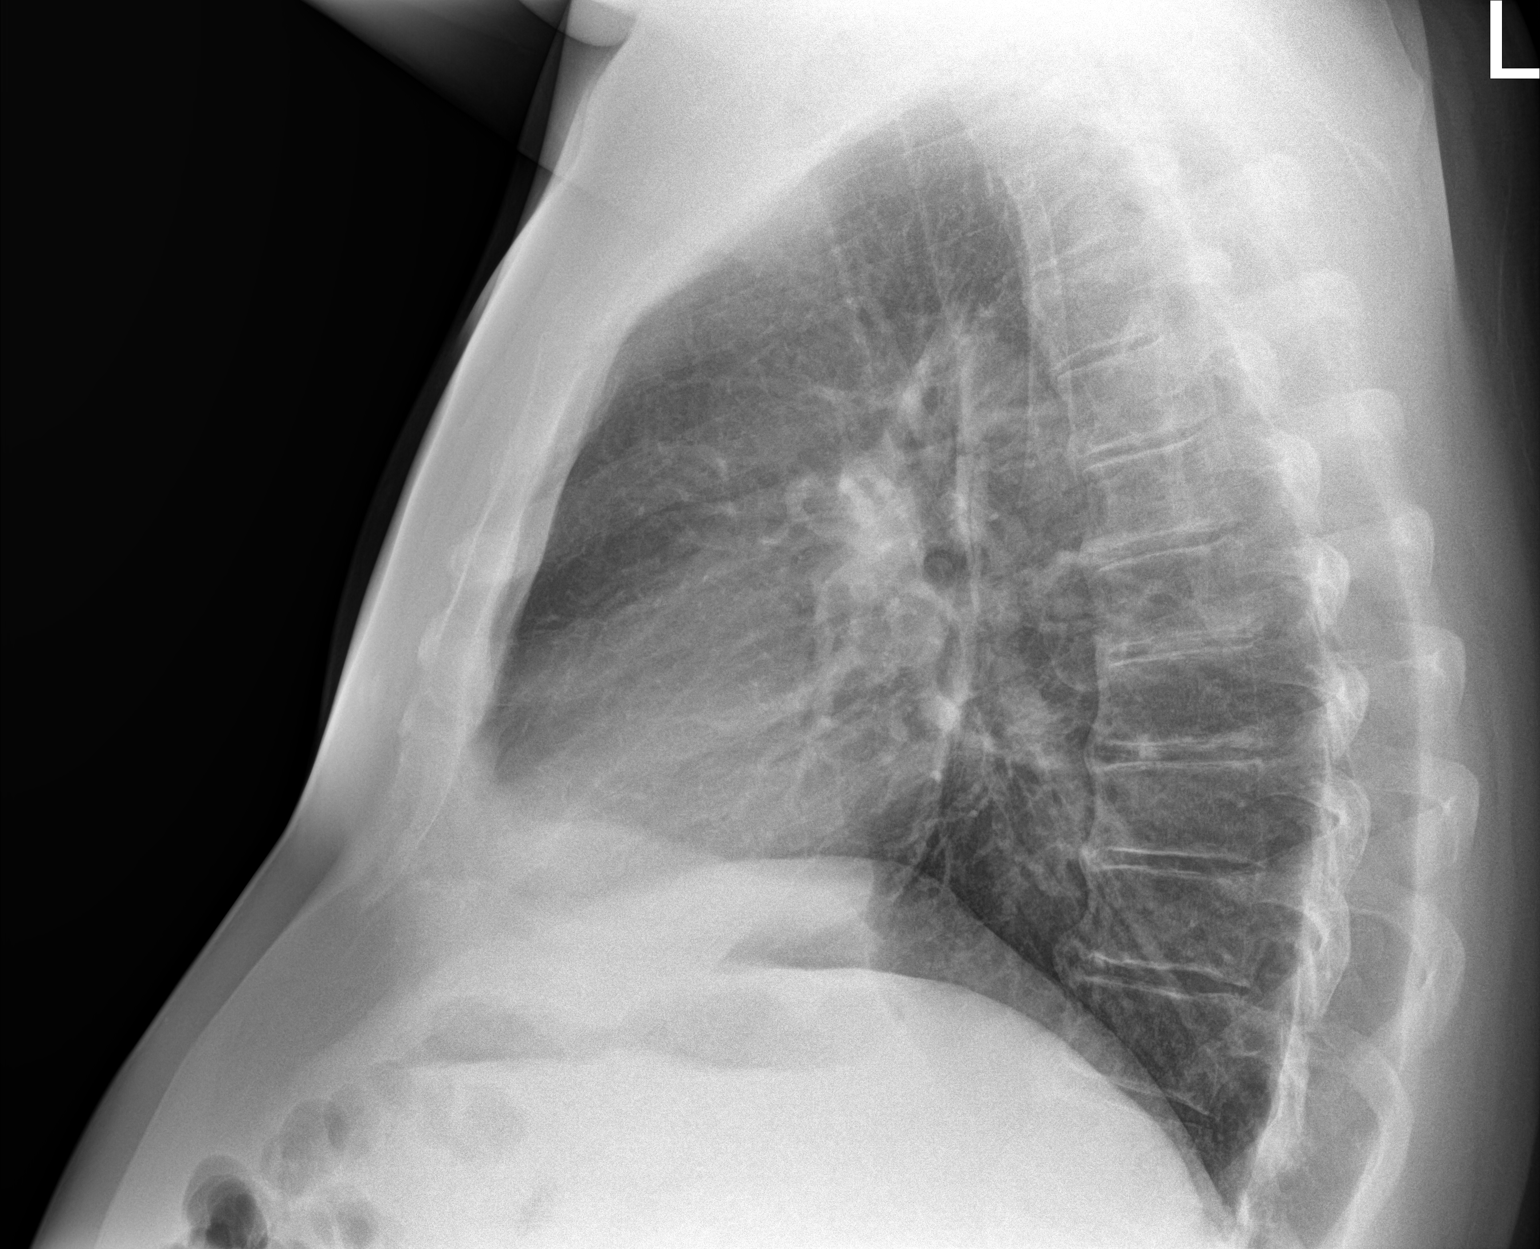

[2 of 2 positions shown; findings below may reference images not displayed]

FINDINGS: The heart size and mediastinal contours are within normal limits.
Both lungs are clear. The visualized skeletal structures are
unremarkable.
IMPRESSION: No active cardiopulmonary disease.

## 2017-07-23 ENCOUNTER — Ambulatory Visit: Payer: BC Managed Care – PPO | Admitting: Family Medicine

## 2017-07-23 ENCOUNTER — Encounter: Payer: Self-pay | Admitting: Family Medicine

## 2017-07-23 VITALS — BP 155/88 | HR 88 | Temp 97.1°F | Ht 69.0 in | Wt 234.8 lb

## 2017-07-23 DIAGNOSIS — L570 Actinic keratosis: Secondary | ICD-10-CM | POA: Diagnosis not present

## 2017-07-23 DIAGNOSIS — E119 Type 2 diabetes mellitus without complications: Secondary | ICD-10-CM | POA: Diagnosis not present

## 2017-07-23 DIAGNOSIS — Z7251 High risk heterosexual behavior: Secondary | ICD-10-CM | POA: Diagnosis not present

## 2017-07-23 DIAGNOSIS — R350 Frequency of micturition: Secondary | ICD-10-CM

## 2017-07-23 DIAGNOSIS — R972 Elevated prostate specific antigen [PSA]: Secondary | ICD-10-CM | POA: Diagnosis not present

## 2017-07-23 LAB — BAYER DCA HB A1C WAIVED: HB A1C: 8.6 % — AB (ref <7.0)

## 2017-07-23 NOTE — Addendum Note (Signed)
Addended by: Nigel Berthold C on: 07/23/2017 10:07 AM   Modules accepted: Orders

## 2017-07-23 NOTE — Patient Instructions (Signed)
Great to see you!  Come back to see Dr. Warrick Parisian in 4-6 months  We will work on referrals to urology and dermatology  Enjoy your summer!

## 2017-07-23 NOTE — Progress Notes (Signed)
   HPI  Patient presents today for follow-up general medical problems.  Elevated PSA With frequent urination, patient would like to see urology Also would like to have PSA drawn. Not want to try Flomax or doxazosin today.  Elevated blood pressure Patient cites dietary discretion and lack of exercise recently. Does not want medications.  Type 2 diabetes Not watching diet No medications  Actinic keratosis, patient has had some of the scalp and some of the arm previously treated with cryotherapy Would like to see dermatology   PMH: Smoking status noted ROS: Per HPI  Objective: BP (!) 155/88   Pulse 88   Temp (!) 97.1 F (36.2 C) (Oral)   Ht '5\' 9"'$  (1.753 m)   Wt 234 lb 12.8 oz (106.5 kg)   BMI 34.67 kg/m  Gen: NAD, alert, cooperative with exam HEENT: NCAT, EOMI, PERRL CV: RRR, good S1/S2, no murmur Resp: CTABL, no wheezes, non-labored Abd: SNTND, BS present, no guarding or organomegaly Ext: No edema, warm Neuro: Alert and oriented, No gross deficits  Skin AK right arm, scalp was clear on cursory exam  Assessment and plan:  #Type 2 diabetes Patient with inadequate follow-up, A1c today Not watching diet Discussed diet and exercise  #Elevated PSA, frequent urination Patient likely with BPH Repeat PSA Urology referral per his request  #AK Patient with previous AK's, at least one seen on the right arm on quick exam Refer to urology  #High risk sexual behavior HIV, RPR, GCC, labs requested  Elevated blood pressure -hypertension not diagnosed today, offered medications however including doxazosin which may help his BPH as well as blood pressure     Orders Placed This Encounter  Procedures  . GC/Chlamydia Probe Amp([facility])  . CBC with Differential/Platelet  . CMP14+EGFR  . Lipid panel  . TSH  . Bayer DCA Hb A1c Waived  . PSA  . RPR  . HIV antibody  . Ambulatory referral to Urology    Referral Priority:   Routine    Referral Type:   Consultation      Referral Reason:   Specialty Services Required    Requested Specialty:   Urology    Number of Visits Requested:   1  . Ambulatory referral to Dermatology    Referral Priority:   Routine    Referral Type:   Consultation    Referral Reason:   Specialty Services Required    Requested Specialty:   Dermatology    Number of Visits Requested:   [place], MD [facility] 07/23/2017, 8:57 AM

## 2017-07-24 LAB — CBC WITH DIFFERENTIAL/PLATELET
BASOS: 1 %
Basophils Absolute: 0 10*3/uL (ref 0.0–0.2)
EOS (ABSOLUTE): 0.1 10*3/uL (ref 0.0–0.4)
EOS: 1 %
HEMATOCRIT: 50.2 % (ref 37.5–51.0)
HEMOGLOBIN: 16.8 g/dL (ref 13.0–17.7)
IMMATURE GRANULOCYTES: 1 %
Immature Grans (Abs): 0 10*3/uL (ref 0.0–0.1)
LYMPHS ABS: 2.4 10*3/uL (ref 0.7–3.1)
Lymphs: 34 %
MCH: 28.1 pg (ref 26.6–33.0)
MCHC: 33.5 g/dL (ref 31.5–35.7)
MCV: 84 fL (ref 79–97)
MONOCYTES: 7 %
Monocytes Absolute: 0.5 10*3/uL (ref 0.1–0.9)
Neutrophils Absolute: 4 10*3/uL (ref 1.4–7.0)
Neutrophils: 56 %
Platelets: 196 10*3/uL (ref 150–450)
RBC: 5.98 x10E6/uL — AB (ref 4.14–5.80)
RDW: 15.3 % (ref 12.3–15.4)
WBC: 7.1 10*3/uL (ref 3.4–10.8)

## 2017-07-24 LAB — HIV ANTIBODY (ROUTINE TESTING W REFLEX): HIV Screen 4th Generation wRfx: NONREACTIVE

## 2017-07-24 LAB — GC/CHLAMYDIA PROBE AMP
CHLAMYDIA, DNA PROBE: NEGATIVE
Neisseria gonorrhoeae by PCR: NEGATIVE

## 2017-07-24 LAB — CMP14+EGFR
ALBUMIN: 4.7 g/dL (ref 3.5–5.5)
ALT: 52 IU/L — ABNORMAL HIGH (ref 0–44)
AST: 32 IU/L (ref 0–40)
Albumin/Globulin Ratio: 2.2 (ref 1.2–2.2)
Alkaline Phosphatase: 114 IU/L (ref 39–117)
BUN / CREAT RATIO: 11 (ref 9–20)
BUN: 10 mg/dL (ref 6–24)
Bilirubin Total: 0.7 mg/dL (ref 0.0–1.2)
CALCIUM: 9.5 mg/dL (ref 8.7–10.2)
CO2: 23 mmol/L (ref 20–29)
CREATININE: 0.87 mg/dL (ref 0.76–1.27)
Chloride: 97 mmol/L (ref 96–106)
GFR, EST AFRICAN AMERICAN: 110 mL/min/{1.73_m2} (ref >59)
GFR, EST NON AFRICAN AMERICAN: 95 mL/min/{1.73_m2} (ref >59)
GLOBULIN, TOTAL: 2.1 g/dL (ref 1.5–4.5)
Glucose: 219 mg/dL — ABNORMAL HIGH (ref 65–99)
Potassium: 4.9 mmol/L (ref 3.5–5.2)
SODIUM: 136 mmol/L (ref 134–144)
Total Protein: 6.8 g/dL (ref 6.0–8.5)

## 2017-07-24 LAB — LIPID PANEL
CHOL/HDL RATIO: 5.6 ratio — AB (ref 0.0–5.0)
Cholesterol, Total: 175 mg/dL (ref 100–199)
HDL: 31 mg/dL — ABNORMAL LOW (ref >39)
LDL CALC: 68 mg/dL (ref 0–99)
Triglycerides: 378 mg/dL — ABNORMAL HIGH (ref 0–149)
VLDL Cholesterol Cal: 76 mg/dL — ABNORMAL HIGH (ref 5–40)

## 2017-07-24 LAB — PSA: PROSTATE SPECIFIC AG, SERUM: 4.7 ng/mL — AB (ref 0.0–4.0)

## 2017-07-24 LAB — RPR: RPR Ser Ql: NONREACTIVE

## 2017-07-24 LAB — TSH: TSH: 2.53 u[IU]/mL (ref 0.450–4.500)

## 2017-07-25 LAB — SPECIMEN STATUS REPORT

## 2017-07-25 LAB — VITAMIN D 25 HYDROXY (VIT D DEFICIENCY, FRACTURES): Vit D, 25-Hydroxy: 29.9 ng/mL — ABNORMAL LOW (ref 30.0–100.0)

## 2017-07-27 ENCOUNTER — Ambulatory Visit: Payer: BC Managed Care – PPO | Admitting: Family Medicine

## 2017-07-27 ENCOUNTER — Encounter: Payer: Self-pay | Admitting: Family Medicine

## 2017-07-27 VITALS — BP 130/79 | HR 82 | Temp 97.3°F | Ht 69.0 in | Wt 233.2 lb

## 2017-07-27 DIAGNOSIS — E781 Pure hyperglyceridemia: Secondary | ICD-10-CM

## 2017-07-27 DIAGNOSIS — E119 Type 2 diabetes mellitus without complications: Secondary | ICD-10-CM | POA: Diagnosis not present

## 2017-07-27 MED ORDER — METFORMIN HCL 500 MG PO TABS: 500.0000 mg | ORAL_TABLET | Freq: Two times a day (BID) | ORAL | 2 refills | Status: DC

## 2017-07-27 NOTE — Patient Instructions (Signed)
Great to see you!   

## 2017-07-27 NOTE — Progress Notes (Signed)
   HPI  Patient presents today for discussion about diabetes.  Patient was seen a few days ago and found to have A1c of 8.6.  He is also concerned about his elevated triglycerides.  Patient states that for quite some time he has been considering lifestyle changes including exercise, decreasing carbohydrates. He has a history of meniscal injury that would likely need surgery which we discussed today as well.  He has established with orthopedic surgery and had an MRI with them.  PMH: Smoking status noted ROS: Per HPI  Objective: BP 130/79   Pulse 82   Temp (!) 97.3 F (36.3 C) (Oral)   Ht 5\' 9"  (1.753 m)   Wt 233 lb 3.2 oz (105.8 kg)   BMI 34.44 kg/m  Gen: NAD, alert, cooperative with exam HEENT: NCAT Ext: No edema, warm Neuro: Alert and oriented, No gross deficits  Assessment and plan:  #Type 2 diabetes Discussed healthy lifestyle changes A1c was 8.6, uncontrolled Start metformin 500 mg twice daily, he has normal renal function Follow-up 3 months Diabetic education No Meter yet   #Hypertriglyceridemia Discussed that this will likely trend down with his A1c with lifestyle changes No medications needed, could consider Vascepa or fish oil.      Orders Placed This Encounter  Procedures  . Ambulatory referral to diabetic education    Referral Priority:   Routine    Referral Type:   Consultation    Referral Reason:   Specialty Services Required    Number of Visits Requested:   1    Meds ordered this encounter  Medications  . metFORMIN (GLUCOPHAGE) 500 MG tablet    Sig: Take 1 tablet (500 mg total) by mouth 2 (two) times daily with a meal.    Dispense:  60 tablet    Refill:  [place], MD [facility] 07/27/2017, 9:03 AM

## 2017-08-14 LAB — HM DIABETES EYE EXAM

## 2017-09-12 ENCOUNTER — Ambulatory Visit: Payer: BC Managed Care – PPO | Admitting: Urology

## 2017-09-12 DIAGNOSIS — R972 Elevated prostate specific antigen [PSA]: Secondary | ICD-10-CM | POA: Diagnosis not present

## 2017-10-02 ENCOUNTER — Encounter: Payer: BC Managed Care – PPO | Attending: Family Medicine | Admitting: Nutrition

## 2017-10-02 VITALS — Ht 69.0 in | Wt 224.0 lb

## 2017-10-02 DIAGNOSIS — E1165 Type 2 diabetes mellitus with hyperglycemia: Secondary | ICD-10-CM

## 2017-10-02 DIAGNOSIS — Z713 Dietary counseling and surveillance: Secondary | ICD-10-CM | POA: Diagnosis not present

## 2017-10-02 DIAGNOSIS — E119 Type 2 diabetes mellitus without complications: Secondary | ICD-10-CM | POA: Diagnosis present

## 2017-10-02 DIAGNOSIS — E118 Type 2 diabetes mellitus with unspecified complications: Secondary | ICD-10-CM

## 2017-10-02 DIAGNOSIS — Z6833 Body mass index (BMI) 33.0-33.9, adult: Secondary | ICD-10-CM

## 2017-10-02 DIAGNOSIS — IMO0002 Reserved for concepts with insufficient information to code with codable children: Secondary | ICD-10-CM

## 2017-10-02 NOTE — Progress Notes (Signed)
  Medical Nutrition Therapy:  Appt start time: 1500 end time:  1600.  Assessment:  Primary concerns today: Diabetes Type 2. DX just recently. He does cooking. He had eate out a lot last year and now eating more at home in the last month. Eats 3 meals and 2 snacks. 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 Metformin 500 mg BID. Not testing blood sugars. He is working on changing on eating and exercising.  Has lost about 15 lbs in the 3 months. Saw Dr. Mathis Bud and now will see Dr. Warrick Parisian next month. Making great progress to make lifestyle changes for his health.  Lab Results  Component Value Date   HGBA1C 6.0 (H) 03/11/2016   Vitals with BMI 10/02/2017  Height 5\' 9"   Weight 224 lbs  BMI 45.03  Systolic   Diastolic   Pulse   Respirations     Preferred Learning Style:  No preference indicated   Learning Readiness:  Ready  Change in progress   MEDICATIONS:   DIETARY INTAKE:    24-hr recall:  B ( AM):  Protein sausage, or oatmeals, unsweet green tea Snk ( AM): fruit or mixed nuts L ( PM): Corn chips and bbq brings lunch from home. water Snk ( PM): fruit or pb D ( PM): onions, rings, bbq, frnech fries OR grilled or baked meat,  Vegetables or bean burrito Snk ( PM): none or klondick bar Beverages: water and unsweet tea  Usual physical activity: ADL  Estimated energy needs: 1800-2000  calories 200  g carbohydrates 135 g protein 50 g fat  Progress Towards Goal(s):  In progress.   Nutritional Diagnosis:  NB-1.1 Food and nutrition-related knowledge deficit As related to DM Type 2.  As evidenced by A1C 6 % with Metformin 500 mg BID.    Intervention:   .Nutrition and Diabetes education provided on My Plate, CHO counting, meal planning, portion sizes, timing of meals, avoiding snacks between meals unless having a low blood sugar, target ranges for A1C and blood sugars, signs/symptoms and treatment of hyper/hypoglycemia, monitoring blood sugars, taking medications as prescribed, benefits of  exercising 30 minutes per day and prevention of complications of DM. Marland Kitchen Goals 1. Follow MY Plate 2. Eat 45-60 grams of carbs per meal 3. Increase fresh fruits and vegetables. 4. Drink only water  Exercise 60 minutes 4 times per week. Lose 1-2 lbs per week   Teaching Method Utilized:  Visual Auditory Hands on  Handouts given during visit include:  The Plate MEthod   Meal Plan  Diabetes Instructions.   Barriers to learning/adherence to lifestyle change: none  Demonstrated degree of understanding via:  Teach Back   Monitoring/Evaluation:  Dietary intake, exercise, meal planning, and body weight in 1 month(s).

## 2017-10-11 ENCOUNTER — Encounter: Payer: Self-pay | Admitting: Nutrition

## 2017-10-11 NOTE — Patient Instructions (Signed)
Goals 1. Follow MY Plate 2. Eat 45-60 grams of carbs per meal 3. Increase fresh fruits and vegetables. 4. Drink only water  Exercise 60 minutes 4 times per week. Lose 1-2 lbs per week

## 2017-10-24 ENCOUNTER — Other Ambulatory Visit: Payer: Self-pay | Admitting: *Deleted

## 2017-10-24 MED ORDER — METFORMIN HCL 500 MG PO TABS: 500.0000 mg | ORAL_TABLET | Freq: Two times a day (BID) | ORAL | 0 refills | Status: DC

## 2017-10-31 ENCOUNTER — Encounter: Payer: Self-pay | Admitting: Family Medicine

## 2017-10-31 ENCOUNTER — Ambulatory Visit: Payer: BC Managed Care – PPO | Admitting: Family Medicine

## 2017-10-31 VITALS — BP 132/79 | HR 87 | Temp 98.2°F | Ht 69.0 in | Wt 225.2 lb

## 2017-10-31 DIAGNOSIS — E669 Obesity, unspecified: Secondary | ICD-10-CM | POA: Diagnosis not present

## 2017-10-31 DIAGNOSIS — E1169 Type 2 diabetes mellitus with other specified complication: Secondary | ICD-10-CM | POA: Diagnosis not present

## 2017-10-31 DIAGNOSIS — K219 Gastro-esophageal reflux disease without esophagitis: Secondary | ICD-10-CM

## 2017-10-31 DIAGNOSIS — Z6833 Body mass index (BMI) 33.0-33.9, adult: Secondary | ICD-10-CM

## 2017-10-31 MED ORDER — METFORMIN HCL 500 MG PO TABS: 500.0000 mg | ORAL_TABLET | Freq: Two times a day (BID) | ORAL | 1 refills | Status: DC

## 2017-10-31 NOTE — Progress Notes (Signed)
BP 132/79   Pulse 87   Temp 98.2 F (36.8 C) (Oral)   Ht '5\' 9"'  (1.753 m)   Wt 225 lb 3.2 oz (102.2 kg)   BMI 33.26 kg/m    Subjective:    Patient ID: Jimmy Mathis, male    DOB: [birth year], 57 y.o.   MRN: [id number]  HPI: Jimmy Mathis is a 57 y.o. male presenting on 10/31/2017 for Hyperlipidemia (3 month follow up); Establish Care Wendi Snipes pt ); and Diabetes   HPI Type 2 diabetes mellitus Patient comes in today for recheck of his diabetes. Patient has been currently taking metformin. Patient is not currently on an ACE inhibitor/ARB. Patient has seen an ophthalmologist this year. Patient denies any issues with their feet.   GERD Patient is currently on no medication right now and feels like he is doing well.  She denies any major symptoms or abdominal pain or belching or burping. She denies any blood in her stool or lightheadedness or dizziness.   Relevant past medical, surgical, family and social history reviewed and updated as indicated. Interim medical history since our last visit reviewed. Allergies and medications reviewed and updated.  Review of Systems  Constitutional: Negative for chills and fever.  Eyes: Negative for visual disturbance.  Respiratory: Negative for shortness of breath and wheezing.   Cardiovascular: Negative for chest pain and leg swelling.  Musculoskeletal: Negative for back pain and gait problem.  Skin: Negative for rash.  Neurological: Negative for dizziness, weakness, light-headedness and numbness.  All other systems reviewed and are negative.   Per HPI unless specifically indicated above   Allergies as of 10/31/2017      Reactions   Penicillins Nausea And Vomiting, Swelling   High fever Has patient had a PCN reaction causing immediate rash, facial/tongue/throat swelling, SOB or lightheadedness with hypotension: No Has patient had a PCN reaction causing severe rash involving mucus membranes or skin necrosis: No Has patient had a PCN  reaction that required hospitalization: No Has patient had a PCN reaction occurring within the last 10 years: No If all of the above answers are "NO", then may proceed with Cephalosporin use.   Procaine Hcl Swelling   Novocaine   Levaquin [levofloxacin] Other (See Comments)   Caused pain in tendons   Minocycline Other (See Comments)   Severe headache   Tetracyclines & Related    Headache      Medication List        Accurate as of 10/31/17  4:14 PM. Always use your most recent med list.          aspirin 81 MG tablet Take 81 mg by mouth daily.   calcium carbonate 500 MG chewable tablet Commonly known as:  TUMS - dosed in mg elemental calcium Chew 1-2 tablets by mouth daily as needed for indigestion or heartburn.   metFORMIN 500 MG tablet Commonly known as:  GLUCOPHAGE Take 1 tablet (500 mg total) by mouth 2 (two) times daily with a meal.   Vitamin D 2000 units Caps Take 2,000 Units by mouth daily.          Objective:    BP 132/79   Pulse 87   Temp 98.2 F (36.8 C) (Oral)   Ht '5\' 9"'  (1.753 m)   Wt 225 lb 3.2 oz (102.2 kg)   BMI 33.26 kg/m   Wt Readings from Last 3 Encounters:  10/31/17 225 lb 3.2 oz (102.2 kg)  10/02/17 224 lb (101.6 kg)  07/27/17 233  lb 3.2 oz (105.8 kg)    Physical Exam  Constitutional: He is oriented to person, place, and time. He appears well-developed and well-nourished. No distress.  Eyes: Conjunctivae are normal. No scleral icterus.  Neck: Neck supple. No thyromegaly present.  Cardiovascular: Normal rate, regular rhythm, normal heart sounds and intact distal pulses.  No murmur heard. Pulmonary/Chest: Effort normal and breath sounds normal. No respiratory distress. He has no wheezes.  Musculoskeletal: Normal range of motion. He exhibits no edema.  Lymphadenopathy:    He has no cervical adenopathy.  Neurological: He is alert and oriented to person, place, and time. Coordination normal.  Skin: Skin is warm and dry. No rash noted. He  is not diaphoretic.  Psychiatric: He has a normal mood and affect. His behavior is normal.  Nursing note and vitals reviewed.   Results for orders placed or performed in visit on 09/05/17  HM DIABETES EYE EXAM  Result Value Ref Range   HM Diabetic Eye Exam No Retinopathy No Retinopathy      Assessment & Plan:   Problem List Items Addressed This Visit      Digestive   GERD   Relevant Orders   BMP8+EGFR     Endocrine   Type 2 diabetes mellitus with other specified complication ([place]) - Primary   Relevant Medications   metFORMIN (GLUCOPHAGE) 500 MG tablet   Other Relevant Orders   Bayer DCA Hb A1c Waived     Other   Obesity   Relevant Medications   metFORMIN (GLUCOPHAGE) 500 MG tablet   Other Relevant Orders   Lipid panel      Patient has lost 14 pounds and he says his blood sugars are running in the 110s to 130s in the a.m. and may be up to 180 at the highest in the afternoons.  He has been trying to reduce his soda and make lifestyle changes and is coming in today for a recheck. Follow up plan: Return in about 3 months (around 01/31/2018), or if symptoms worsen or fail to improve, for Type 2 diabetes.  Counseling provided for all of the vaccine components Orders Placed This Encounter  Procedures  . Bayer DCA Hb A1c Waived  . BMP8+EGFR  . Lipid panel    Caryl Pina, MD [facility] 10/31/2017, 4:14 PM

## 2017-11-05 ENCOUNTER — Other Ambulatory Visit: Payer: BC Managed Care – PPO

## 2017-11-05 DIAGNOSIS — Z6833 Body mass index (BMI) 33.0-33.9, adult: Principal | ICD-10-CM

## 2017-11-05 DIAGNOSIS — E669 Obesity, unspecified: Secondary | ICD-10-CM

## 2017-11-05 DIAGNOSIS — E1169 Type 2 diabetes mellitus with other specified complication: Secondary | ICD-10-CM

## 2017-11-05 DIAGNOSIS — K219 Gastro-esophageal reflux disease without esophagitis: Secondary | ICD-10-CM

## 2017-11-05 LAB — BMP8+EGFR
BUN/Creatinine Ratio: 12 (ref 9–20)
BUN: 12 mg/dL (ref 6–24)
CO2: 22 mmol/L (ref 20–29)
Calcium: 9.5 mg/dL (ref 8.7–10.2)
Chloride: 101 mmol/L (ref 96–106)
Creatinine, Ser: 0.99 mg/dL (ref 0.76–1.27)
GFR calc Af Amer: 97 mL/min/{1.73_m2} (ref >59)
GFR calc non Af Amer: 84 mL/min/{1.73_m2} (ref >59)
Glucose: 114 mg/dL — ABNORMAL HIGH (ref 65–99)
Potassium: 4 mmol/L (ref 3.5–5.2)
Sodium: 140 mmol/L (ref 134–144)

## 2017-11-05 LAB — LIPID PANEL
Chol/HDL Ratio: 4.3 ratio (ref 0.0–5.0)
Cholesterol, Total: 145 mg/dL (ref 100–199)
HDL: 34 mg/dL — ABNORMAL LOW (ref >39)
LDL Calculated: 69 mg/dL (ref 0–99)
Triglycerides: 211 mg/dL — ABNORMAL HIGH (ref 0–149)
VLDL Cholesterol Cal: 42 mg/dL — ABNORMAL HIGH (ref 5–40)

## 2017-11-05 LAB — BAYER DCA HB A1C WAIVED: HB A1C (BAYER DCA - WAIVED): 5.9 % (ref <7.0)

## 2017-12-18 ENCOUNTER — Ambulatory Visit: Payer: Self-pay | Admitting: Nutrition

## 2018-02-06 ENCOUNTER — Encounter: Payer: Self-pay | Admitting: Family Medicine

## 2018-02-06 ENCOUNTER — Ambulatory Visit: Payer: BC Managed Care – PPO | Admitting: Family Medicine

## 2018-02-06 VITALS — BP 139/78 | HR 66 | Temp 97.2°F | Ht 69.0 in | Wt 221.6 lb

## 2018-02-06 DIAGNOSIS — E1169 Type 2 diabetes mellitus with other specified complication: Secondary | ICD-10-CM | POA: Diagnosis not present

## 2018-02-06 DIAGNOSIS — K219 Gastro-esophageal reflux disease without esophagitis: Secondary | ICD-10-CM

## 2018-02-06 DIAGNOSIS — N400 Enlarged prostate without lower urinary tract symptoms: Secondary | ICD-10-CM

## 2018-02-06 DIAGNOSIS — Z202 Contact with and (suspected) exposure to infections with a predominantly sexual mode of transmission: Secondary | ICD-10-CM

## 2018-02-06 DIAGNOSIS — Z6833 Body mass index (BMI) 33.0-33.9, adult: Secondary | ICD-10-CM

## 2018-02-06 DIAGNOSIS — E669 Obesity, unspecified: Secondary | ICD-10-CM | POA: Diagnosis not present

## 2018-02-06 MED ORDER — METFORMIN HCL 500 MG PO TABS: 500.0000 mg | ORAL_TABLET | Freq: Two times a day (BID) | ORAL | 1 refills | Status: DC

## 2018-02-06 NOTE — Progress Notes (Signed)
BP 139/78   Pulse 66   Temp (!) 97.2 F (36.2 C) (Oral)   Ht '5\' 9"'  (1.753 m)   Wt 221 lb 9.6 oz (100.5 kg)   BMI 32.72 kg/m    Subjective:    Patient ID: Jimmy Mathis, male    DOB: 1960/12/22, 58 y.o.   MRN: [id number]  HPI: Jimmy Mathis is a 58 y.o. male presenting on 02/06/2018 for Diabetes (3 month follow up) and Hyperlipidemia   HPI Type 2 diabetes mellitus Patient comes in today for recheck of his diabetes. Patient has been currently taking metformin 500 once a day. Patient is not currently on an ACE inhibitor/ARB. Patient has not seen an ophthalmologist this year. Patient denies any issues with their feet.   GERD Patient is currently on no medication currently and says is been going well.  She denies any major symptoms or abdominal pain or belching or burping. She denies any blood in her stool or lightheadedness or dizziness.   Erectile dysfunction and BPH and possible STD exposure Patient says his BPH is doing a lot better he denies any urinary stream issues or nocturia or urinary frequency and does want to get his PSA rechecked.  He says he did not end up taking the Flomax because he did not need it and things have been much improved.  He does say he is having some mild erectile dysfunction but does not want to do anything for treatment at this point but just wanted to mention it.  He does say that he has had a few different partners over the past year and does not always use protection and so he does want to do some STD panel testing today here in the office.  He denies any symptoms today  Relevant past medical, surgical, family and social history reviewed and updated as indicated. Interim medical history since our last visit reviewed. Allergies and medications reviewed and updated.  Review of Systems  Constitutional: Negative for chills and fever.  Eyes: Negative for visual disturbance.  Respiratory: Negative for shortness of breath and wheezing.   Cardiovascular:  Negative for chest pain and leg swelling.  Endocrine: Negative for cold intolerance and heat intolerance.  Musculoskeletal: Negative for back pain and gait problem.  Skin: Negative for rash.  Neurological: Negative for dizziness, weakness, light-headedness and numbness.  All other systems reviewed and are negative.   Per HPI unless specifically indicated above   Allergies as of 02/06/2018      Reactions   Penicillins Nausea And Vomiting, Swelling   High fever Has patient had a PCN reaction causing immediate rash, facial/tongue/throat swelling, SOB or lightheadedness with hypotension: No Has patient had a PCN reaction causing severe rash involving mucus membranes or skin necrosis: No Has patient had a PCN reaction that required hospitalization: No Has patient had a PCN reaction occurring within the last 10 years: No If all of the above answers are "NO", then may proceed with Cephalosporin use.   Procaine Hcl Swelling   Novocaine   Levaquin [levofloxacin] Other (See Comments)   Caused pain in tendons   Minocycline Other (See Comments)   Severe headache   Tetracyclines & Related    Headache      Medication List       Accurate as of February 06, 2018  4:40 PM. Always use your most recent med list.        aspirin 81 MG tablet Take 81 mg by mouth daily.   calcium  carbonate 500 MG chewable tablet Commonly known as:  TUMS - dosed in mg elemental calcium Chew 1-2 tablets by mouth daily as needed for indigestion or heartburn.   metFORMIN 500 MG tablet Commonly known as:  GLUCOPHAGE Take 1 tablet (500 mg total) by mouth 2 (two) times daily with a meal.   Vitamin D 50 MCG (2000 UT) Caps Take 2,000 Units by mouth daily.          Objective:    BP 139/78   Pulse 66   Temp (!) 97.2 F (36.2 C) (Oral)   Ht '5\' 9"'  (1.753 m)   Wt 221 lb 9.6 oz (100.5 kg)   BMI 32.72 kg/m   Wt Readings from Last 3 Encounters:  02/06/18 221 lb 9.6 oz (100.5 kg)  10/31/17 225 lb 3.2 oz  (102.2 kg)  10/02/17 224 lb (101.6 kg)    Physical Exam Vitals signs and nursing note reviewed.  Constitutional:      General: He is not in acute distress.    Appearance: He is well-developed. He is not diaphoretic.  Eyes:     General: No scleral icterus.    Conjunctiva/sclera: Conjunctivae normal.  Neck:     Musculoskeletal: Neck supple.     Thyroid: No thyromegaly.  Cardiovascular:     Rate and Rhythm: Normal rate and regular rhythm.     Heart sounds: Normal heart sounds. No murmur.  Pulmonary:     Effort: Pulmonary effort is normal. No respiratory distress.     Breath sounds: Normal breath sounds. No wheezing.  Musculoskeletal: Normal range of motion.  Lymphadenopathy:     Cervical: No cervical adenopathy.  Skin:    General: Skin is warm and dry.     Findings: No rash.  Neurological:     Mental Status: He is alert and oriented to person, place, and time.     Coordination: Coordination normal.  Psychiatric:        Behavior: Behavior normal.        Assessment & Plan:   Problem List Items Addressed This Visit      Digestive   GERD   Relevant Orders   CBC with Differential/Platelet     Endocrine   Type 2 diabetes mellitus with other specified complication ([place]) - Primary   Relevant Medications   metFORMIN (GLUCOPHAGE) 500 MG tablet   Other Relevant Orders   CMP14+EGFR     Genitourinary   BPH (benign prostatic hyperplasia)   Relevant Orders   PSA, total and free     Other   Obesity   Relevant Medications   metFORMIN (GLUCOPHAGE) 500 MG tablet   Other Relevant Orders   Lipid panel    Other Visit Diagnoses    Possible exposure to STD       Relevant Orders   CBC with Differential/Platelet   STD Screen (8)   Chlamydia/Gonococcus/Trichomonas, NAA       Follow up plan: Return in about 3 months (around 05/08/2018), or if symptoms worsen or fail to improve, for Diabetes and blood pressure recheck.  Counseling provided for all of the vaccine  components Orders Placed This Encounter  Procedures  . Chlamydia/Gonococcus/Trichomonas, NAA  . CBC with Differential/Platelet  . CMP14+EGFR  . Lipid panel  . PSA, total and free  . STD Screen (8)    Caryl Pina, MD [facility] 02/06/2018, 4:40 PM

## 2018-02-09 ENCOUNTER — Other Ambulatory Visit: Payer: BC Managed Care – PPO

## 2018-02-09 DIAGNOSIS — E669 Obesity, unspecified: Secondary | ICD-10-CM

## 2018-02-09 DIAGNOSIS — Z6833 Body mass index (BMI) 33.0-33.9, adult: Secondary | ICD-10-CM

## 2018-02-09 DIAGNOSIS — Z202 Contact with and (suspected) exposure to infections with a predominantly sexual mode of transmission: Secondary | ICD-10-CM

## 2018-02-09 DIAGNOSIS — E1169 Type 2 diabetes mellitus with other specified complication: Secondary | ICD-10-CM

## 2018-02-09 DIAGNOSIS — N400 Enlarged prostate without lower urinary tract symptoms: Secondary | ICD-10-CM

## 2018-02-09 DIAGNOSIS — K219 Gastro-esophageal reflux disease without esophagitis: Secondary | ICD-10-CM

## 2018-02-10 ENCOUNTER — Encounter: Payer: Self-pay | Admitting: Family Medicine

## 2018-02-10 LAB — SPECIMEN STATUS REPORT

## 2018-02-11 ENCOUNTER — Other Ambulatory Visit: Payer: Self-pay | Admitting: *Deleted

## 2018-02-11 DIAGNOSIS — R972 Elevated prostate specific antigen [PSA]: Secondary | ICD-10-CM

## 2018-02-12 LAB — STD SCREEN (8)
HIV Screen 4th Generation wRfx: NONREACTIVE
HSV 1 Glycoprotein G Ab, IgG: <0.91 index (ref 0.00–0.90)
HSV 2 IgG, Type Spec: <0.91 index (ref 0.00–0.90)
Hep A IgM: NEGATIVE
Hep B C IgM: NEGATIVE
Hepatitis B Surface Ag: NEGATIVE
RPR Ser Ql: NONREACTIVE

## 2018-02-12 LAB — CBC WITH DIFFERENTIAL/PLATELET
BASOS ABS: 0.1 10*3/uL (ref 0.0–0.2)
Basos: 1 %
EOS (ABSOLUTE): 0.1 10*3/uL (ref 0.0–0.4)
Eos: 1 %
Hematocrit: 50 % (ref 37.5–51.0)
Hemoglobin: 16.6 g/dL (ref 13.0–17.7)
Immature Grans (Abs): 0 10*3/uL (ref 0.0–0.1)
Immature Granulocytes: 0 %
Lymphocytes Absolute: 2.1 10*3/uL (ref 0.7–3.1)
Lymphs: 31 %
MCH: 27.7 pg (ref 26.6–33.0)
MCHC: 33.2 g/dL (ref 31.5–35.7)
MCV: 84 fL (ref 79–97)
Monocytes Absolute: 0.7 10*3/uL (ref 0.1–0.9)
Monocytes: 11 %
NEUTROS ABS: 3.7 10*3/uL (ref 1.4–7.0)
Neutrophils: 56 %
Platelets: 228 10*3/uL (ref 150–450)
RBC: 5.99 x10E6/uL — ABNORMAL HIGH (ref 4.14–5.80)
RDW: 14 % (ref 11.6–15.4)
WBC: 6.7 10*3/uL (ref 3.4–10.8)

## 2018-02-12 LAB — CMP14+EGFR
ALBUMIN: 4.5 g/dL (ref 3.8–4.9)
ALT: 24 IU/L (ref 0–44)
AST: 19 IU/L (ref 0–40)
Albumin/Globulin Ratio: 2 (ref 1.2–2.2)
Alkaline Phosphatase: 89 IU/L (ref 39–117)
BUN/Creatinine Ratio: 14 (ref 9–20)
BUN: 14 mg/dL (ref 6–24)
Bilirubin Total: 0.8 mg/dL (ref 0.0–1.2)
CHLORIDE: 102 mmol/L (ref 96–106)
CO2: 24 mmol/L (ref 20–29)
Calcium: 9.5 mg/dL (ref 8.7–10.2)
Creatinine, Ser: 0.97 mg/dL (ref 0.76–1.27)
GFR calc Af Amer: 99 mL/min/{1.73_m2} (ref >59)
GFR calc non Af Amer: 86 mL/min/{1.73_m2} (ref >59)
GLOBULIN, TOTAL: 2.2 g/dL (ref 1.5–4.5)
Glucose: 98 mg/dL (ref 65–99)
POTASSIUM: 4.9 mmol/L (ref 3.5–5.2)
Sodium: 141 mmol/L (ref 134–144)
Total Protein: 6.7 g/dL (ref 6.0–8.5)

## 2018-02-12 LAB — LIPID PANEL
Chol/HDL Ratio: 4 ratio (ref 0.0–5.0)
Cholesterol, Total: 141 mg/dL (ref 100–199)
HDL: 35 mg/dL — ABNORMAL LOW (ref >39)
LDL Calculated: 74 mg/dL (ref 0–99)
Triglycerides: 160 mg/dL — ABNORMAL HIGH (ref 0–149)
VLDL Cholesterol Cal: 32 mg/dL (ref 5–40)

## 2018-02-12 LAB — PSA, TOTAL AND FREE
PSA FREE PCT: 22.4 %
PSA, Free: 1.3 ng/mL
Prostate Specific Ag, Serum: 5.8 ng/mL — ABNORMAL HIGH (ref 0.0–4.0)

## 2018-02-12 LAB — SPECIMEN STATUS REPORT

## 2018-02-14 LAB — SPECIMEN STATUS REPORT

## 2018-02-14 LAB — HGB A1C W/O EAG: Hgb A1c MFr Bld: 5.6 % (ref 4.8–5.6)

## 2018-02-16 LAB — SPECIMEN STATUS REPORT

## 2018-02-16 LAB — CHLAMYDIA/GONOCOCCUS/TRICHOMONAS, NAA
Chlamydia by NAA: NEGATIVE
Gonococcus by NAA: NEGATIVE
Trich vag by NAA: NEGATIVE

## 2018-03-27 ENCOUNTER — Other Ambulatory Visit (HOSPITAL_COMMUNITY): Payer: Self-pay | Admitting: Urology

## 2018-03-27 ENCOUNTER — Ambulatory Visit: Payer: BC Managed Care – PPO | Admitting: Urology

## 2018-03-27 DIAGNOSIS — R972 Elevated prostate specific antigen [PSA]: Secondary | ICD-10-CM | POA: Diagnosis not present

## 2018-03-27 DIAGNOSIS — R3912 Poor urinary stream: Secondary | ICD-10-CM

## 2018-05-02 ENCOUNTER — Ambulatory Visit (HOSPITAL_COMMUNITY): Payer: BC Managed Care – PPO

## 2018-05-07 ENCOUNTER — Other Ambulatory Visit: Payer: Self-pay

## 2018-05-08 ENCOUNTER — Ambulatory Visit: Payer: BC Managed Care – PPO | Admitting: Family Medicine

## 2018-05-08 ENCOUNTER — Encounter: Payer: Self-pay | Admitting: Family Medicine

## 2018-05-08 VITALS — BP 137/83 | HR 83 | Temp 98.4°F | Ht 69.0 in | Wt 219.2 lb

## 2018-05-08 DIAGNOSIS — E1169 Type 2 diabetes mellitus with other specified complication: Secondary | ICD-10-CM | POA: Diagnosis not present

## 2018-05-08 DIAGNOSIS — E669 Obesity, unspecified: Secondary | ICD-10-CM

## 2018-05-08 DIAGNOSIS — E559 Vitamin D deficiency, unspecified: Secondary | ICD-10-CM

## 2018-05-08 DIAGNOSIS — M7552 Bursitis of left shoulder: Secondary | ICD-10-CM | POA: Diagnosis not present

## 2018-05-08 DIAGNOSIS — Z6833 Body mass index (BMI) 33.0-33.9, adult: Secondary | ICD-10-CM

## 2018-05-08 DIAGNOSIS — R972 Elevated prostate specific antigen [PSA]: Secondary | ICD-10-CM | POA: Diagnosis not present

## 2018-05-08 LAB — BAYER DCA HB A1C WAIVED: HB A1C (BAYER DCA - WAIVED): 5.5 % (ref <7.0)

## 2018-05-08 MED ORDER — METHYLPREDNISOLONE ACETATE 80 MG/ML IJ SUSP
80.0000 mg | Freq: Once | INTRAMUSCULAR | Status: AC
  Administered 2018-05-08: 80 mg via INTRAMUSCULAR

## 2018-05-08 NOTE — Progress Notes (Signed)
BP 137/83   Pulse 83   Temp 98.4 F (36.9 C) (Oral)   Ht 5\' 9"  (1.753 m)   Wt 219 lb 3.2 oz (99.4 kg)   BMI 32.37 kg/m    Subjective:   Patient ID: Jimmy Mathis, male    DOB: 1960/08/05, 58 y.o.   MRN: [id number]  HPI: Jimmy Mathis is a 58 y.o. male presenting on 05/08/2018 for Diabetes (3 month follow up- PSA checked ) and Shoulder Pain (left - Patient states has been going on a few months)   HPI Type 2 diabetes mellitus Patient comes in today for recheck of his diabetes. Patient has been currently taking no medication and has been off the metformin and he feels like the sugars are up a little bit but still doing okay.. Patient is not currently on an ACE inhibitor/ARB. Patient has not seen an ophthalmologist this year. Patient denies any issues with their feet.  We discussed obesity and weight and the challenges during coronavirus, he says that he will try to make adjustments and get back to the gym especially once the coronavirus is done.  Elevated PSA Patient had an elevated PSA of 5.8 and is coming in for recheck today.  He says he is scheduled with urology for possible biopsy and he is debating whether or not he wants it not by wants to get it rechecked again today.  Recommended that we can check it but that he still follow-up with urology on their recommendations  Left shoulder pain Patient complains of left shoulder pain that is bothering him anteriorly and down the lateral aspect of the left shoulder.  His shoulder is been bothering him over the past few months.  His left shoulder is the one that is been bothering him.  It hurts him with overhead range of motion.  Vitamin D deficiency recheck She has a history of vitamin D deficiency and needs a recheck today.  Relevant past medical, surgical, family and social history reviewed and updated as indicated. Interim medical history since our last visit reviewed. Allergies and medications reviewed and updated.  Review of  Systems  Constitutional: Negative for chills and fever.  Eyes: Negative for visual disturbance.  Respiratory: Negative for shortness of breath and wheezing.   Cardiovascular: Negative for chest pain and leg swelling.  Genitourinary: Negative for difficulty urinating.  Musculoskeletal: Positive for arthralgias. Negative for back pain, gait problem, joint swelling and myalgias.  Skin: Negative for color change and rash.  All other systems reviewed and are negative.   Per HPI unless specifically indicated above   Allergies as of 05/08/2018      Reactions   Penicillins Nausea And Vomiting, Swelling   High fever Has patient had a PCN reaction causing immediate rash, facial/tongue/throat swelling, SOB or lightheadedness with hypotension: No Has patient had a PCN reaction causing severe rash involving mucus membranes or skin necrosis: No Has patient had a PCN reaction that required hospitalization: No Has patient had a PCN reaction occurring within the last 10 years: No If all of the above answers are "NO", then may proceed with Cephalosporin use.   Procaine Hcl Swelling   Novocaine   Levaquin [levofloxacin] Other (See Comments)   Caused pain in tendons   Minocycline Other (See Comments)   Severe headache   Tetracyclines & Related    Headache      Medication List       Accurate as of May 08, 2018  3:32 PM. Always use  your most recent med list.        aspirin 81 MG tablet Take 81 mg by mouth daily.   calcium carbonate 500 MG chewable tablet Commonly known as:  TUMS - dosed in mg elemental calcium Chew 1-2 tablets by mouth daily as needed for indigestion or heartburn.   Vitamin D 50 MCG (2000 UT) Caps Take 2,000 Units by mouth daily.        Objective:   BP 137/83   Pulse 83   Temp 98.4 F (36.9 C) (Oral)   Ht 5\' 9"  (1.753 m)   Wt 219 lb 3.2 oz (99.4 kg)   BMI 32.37 kg/m   Wt Readings from Last 3 Encounters:  05/08/18 219 lb 3.2 oz (99.4 kg)  02/06/18 221 lb  9.6 oz (100.5 kg)  10/31/17 225 lb 3.2 oz (102.2 kg)    Physical Exam Vitals signs and nursing note reviewed.  Constitutional:      General: He is not in acute distress.    Appearance: He is well-developed. He is not diaphoretic.  Eyes:     General: No scleral icterus.    Conjunctiva/sclera: Conjunctivae normal.  Neck:     Musculoskeletal: Neck supple.     Thyroid: No thyromegaly.  Cardiovascular:     Rate and Rhythm: Normal rate and regular rhythm.     Heart sounds: Normal heart sounds. No murmur.  Pulmonary:     Effort: Pulmonary effort is normal. No respiratory distress.     Breath sounds: Normal breath sounds. No wheezing.  Abdominal:     General: Abdomen is flat. Bowel sounds are normal. There is no distension.     Tenderness: There is no abdominal tenderness.  Musculoskeletal: Normal range of motion.     Left shoulder: He exhibits tenderness (Anterior lateral tenderness, negative rotator cuff testing). He exhibits normal range of motion, no swelling and no deformity.  Lymphadenopathy:     Cervical: No cervical adenopathy.  Skin:    General: Skin is warm and dry.     Findings: No rash.  Neurological:     Mental Status: He is alert and oriented to person, place, and time.     Coordination: Coordination normal.  Psychiatric:        Behavior: Behavior normal.     Left subacromial injection: Verbal consent signed. Risk factors of bleeding and infection discussed with patient and patient is agreeable towards injection. Patient prepped with Betadine.  Posterior approach towards injection used. Injected 80 mg of Depo-Medrol and 1 mL of 2% lidocaine. Patient tolerated procedure well and no side effects from noted. Minimal to no bleeding. Simple bandage applied after.   Assessment & Plan:   Problem List Items Addressed This Visit      Endocrine   Type 2 diabetes mellitus with other specified complication ([place]) - Primary   Relevant Orders   Bayer DCA Hb A1c Waived  (Completed)     Other   Obesity   Vitamin D deficiency   Relevant Orders   VITAMIN D 25 Hydroxy (Vit-D Deficiency, Fractures) (Completed)    Other Visit Diagnoses    Elevated PSA       Relevant Orders   PSA, total and free (Completed)   Subacromial bursitis of left shoulder joint       Relevant Medications   methylPREDNISolone acetate (DEPO-MEDROL) injection 80 mg (Completed)       Follow up plan: Return in about 3 months (around 08/07/2018), or if symptoms worsen or fail to  improve, for Diabetes recheck.  Counseling provided for all of the vaccine components No orders of the defined types were placed in this encounter.   Caryl Pina, MD [facility] 05/08/2018, 3:32 PM

## 2018-05-09 ENCOUNTER — Encounter: Payer: Self-pay | Admitting: Family Medicine

## 2018-05-09 LAB — VITAMIN D 25 HYDROXY (VIT D DEFICIENCY, FRACTURES): Vit D, 25-Hydroxy: 47.3 ng/mL (ref 30.0–100.0)

## 2018-05-09 LAB — PSA, TOTAL AND FREE
PSA, Free Pct: 25.1 %
PSA, Free: 1.28 ng/mL
Prostate Specific Ag, Serum: 5.1 ng/mL — ABNORMAL HIGH (ref 0.0–4.0)

## 2018-05-22 ENCOUNTER — Ambulatory Visit (HOSPITAL_COMMUNITY): Admission: RE | Admit: 2018-05-22 | Payer: BC Managed Care – PPO | Source: Ambulatory Visit

## 2018-05-22 ENCOUNTER — Encounter (HOSPITAL_COMMUNITY): Payer: Self-pay

## 2018-07-30 ENCOUNTER — Encounter: Payer: Self-pay | Admitting: Family Medicine

## 2018-07-30 DIAGNOSIS — N400 Enlarged prostate without lower urinary tract symptoms: Secondary | ICD-10-CM

## 2018-07-30 DIAGNOSIS — E119 Type 2 diabetes mellitus without complications: Secondary | ICD-10-CM

## 2018-07-30 DIAGNOSIS — Z202 Contact with and (suspected) exposure to infections with a predominantly sexual mode of transmission: Secondary | ICD-10-CM

## 2018-08-01 ENCOUNTER — Other Ambulatory Visit: Payer: BC Managed Care – PPO

## 2018-08-01 ENCOUNTER — Other Ambulatory Visit: Payer: Self-pay

## 2018-08-01 DIAGNOSIS — N400 Enlarged prostate without lower urinary tract symptoms: Secondary | ICD-10-CM

## 2018-08-01 DIAGNOSIS — Z202 Contact with and (suspected) exposure to infections with a predominantly sexual mode of transmission: Secondary | ICD-10-CM

## 2018-08-01 DIAGNOSIS — E119 Type 2 diabetes mellitus without complications: Secondary | ICD-10-CM

## 2018-08-01 LAB — BAYER DCA HB A1C WAIVED: HB A1C (BAYER DCA - WAIVED): 6.1 % (ref <7.0)

## 2018-08-03 LAB — CMP14+EGFR
ALT: 41 IU/L (ref 0–44)
AST: 25 IU/L (ref 0–40)
Albumin/Globulin Ratio: 2.1 (ref 1.2–2.2)
Albumin: 4.6 g/dL (ref 3.8–4.9)
Alkaline Phosphatase: 82 IU/L (ref 39–117)
BUN/Creatinine Ratio: 10 (ref 9–20)
BUN: 11 mg/dL (ref 6–24)
Bilirubin Total: 0.8 mg/dL (ref 0.0–1.2)
CO2: 23 mmol/L (ref 20–29)
Calcium: 10.1 mg/dL (ref 8.7–10.2)
Chloride: 100 mmol/L (ref 96–106)
Creatinine, Ser: 1.15 mg/dL (ref 0.76–1.27)
GFR calc Af Amer: 80 mL/min/{1.73_m2} (ref >59)
GFR calc non Af Amer: 69 mL/min/{1.73_m2} (ref >59)
Globulin, Total: 2.2 g/dL (ref 1.5–4.5)
Glucose: 113 mg/dL — ABNORMAL HIGH (ref 65–99)
Potassium: 4.8 mmol/L (ref 3.5–5.2)
Sodium: 140 mmol/L (ref 134–144)
Total Protein: 6.8 g/dL (ref 6.0–8.5)

## 2018-08-03 LAB — CBC WITH DIFFERENTIAL/PLATELET
Basophils Absolute: 0.1 10*3/uL (ref 0.0–0.2)
Basos: 1 %
EOS (ABSOLUTE): 0.1 10*3/uL (ref 0.0–0.4)
Eos: 1 %
Hematocrit: 49 % (ref 37.5–51.0)
Hemoglobin: 17 g/dL (ref 13.0–17.7)
Immature Grans (Abs): 0 10*3/uL (ref 0.0–0.1)
Immature Granulocytes: 0 %
Lymphocytes Absolute: 2.1 10*3/uL (ref 0.7–3.1)
Lymphs: 31 %
MCH: 28.1 pg (ref 26.6–33.0)
MCHC: 34.7 g/dL (ref 31.5–35.7)
MCV: 81 fL (ref 79–97)
Monocytes Absolute: 0.7 10*3/uL (ref 0.1–0.9)
Monocytes: 10 %
Neutrophils Absolute: 3.8 10*3/uL (ref 1.4–7.0)
Neutrophils: 57 %
Platelets: 213 10*3/uL (ref 150–450)
RBC: 6.04 x10E6/uL — ABNORMAL HIGH (ref 4.14–5.80)
RDW: 14.2 % (ref 11.6–15.4)
WBC: 6.7 10*3/uL (ref 3.4–10.8)

## 2018-08-03 LAB — STD SCREEN (8)
HIV Screen 4th Generation wRfx: NONREACTIVE
HSV 1 Glycoprotein G Ab, IgG: <0.91 index (ref 0.00–0.90)
HSV 2 IgG, Type Spec: <0.91 index (ref 0.00–0.90)
Hep A IgM: NEGATIVE
Hep B C IgM: NEGATIVE
Hep C Virus Ab: <0.1 s/co ratio (ref 0.0–0.9)
Hepatitis B Surface Ag: NEGATIVE
RPR Ser Ql: NONREACTIVE

## 2018-08-03 LAB — LIPID PANEL
Chol/HDL Ratio: 4.8 ratio (ref 0.0–5.0)
Cholesterol, Total: 163 mg/dL (ref 100–199)
HDL: 34 mg/dL — ABNORMAL LOW (ref >39)
LDL Calculated: 84 mg/dL (ref 0–99)
Triglycerides: 224 mg/dL — ABNORMAL HIGH (ref 0–149)
VLDL Cholesterol Cal: 45 mg/dL — ABNORMAL HIGH (ref 5–40)

## 2018-08-03 LAB — TESTOSTERONE,FREE AND TOTAL
Testosterone, Free: 9.1 pg/mL (ref 7.2–24.0)
Testosterone: 270 ng/dL (ref 264–916)

## 2018-08-07 LAB — CHLAMYDIA/GONOCOCCUS/TRICHOMONAS, NAA
Chlamydia by NAA: NEGATIVE
Gonococcus by NAA: NEGATIVE
Trich vag by NAA: NEGATIVE

## 2018-08-09 ENCOUNTER — Encounter: Payer: Self-pay | Admitting: Family Medicine

## 2018-08-09 ENCOUNTER — Ambulatory Visit (INDEPENDENT_AMBULATORY_CARE_PROVIDER_SITE_OTHER): Payer: BC Managed Care – PPO | Admitting: Family Medicine

## 2018-08-09 DIAGNOSIS — N400 Enlarged prostate without lower urinary tract symptoms: Secondary | ICD-10-CM | POA: Diagnosis not present

## 2018-08-09 DIAGNOSIS — K219 Gastro-esophageal reflux disease without esophagitis: Secondary | ICD-10-CM | POA: Diagnosis not present

## 2018-08-09 DIAGNOSIS — E1169 Type 2 diabetes mellitus with other specified complication: Secondary | ICD-10-CM

## 2018-08-09 NOTE — Progress Notes (Signed)
Virtual Visit via telephone Note  I connected with Jimmy Mathis on 08/09/18 at 0753 by telephone and verified that I am speaking with the correct person using two identifiers. Jimmy Mathis is currently located at home and no other people are currently with her during visit. The provider, Fransisca Kaufmann Taygen Newsome, MD is located in their office at time of visit.  Call ended at 0810  I discussed the limitations, risks, security and privacy concerns of performing an evaluation and management service by telephone and the availability of in person appointments. I also discussed with the patient that there may be a patient responsible charge related to this service. The patient expressed understanding and agreed to proceed.   History and Present Illness: Type 2 diabetes mellitus Patient comes in today for recheck of his diabetes. Patient has been currently taking diet control and bs in the am is 100-128. Patient is not currently on an ACE inhibitor/ARB. Patient has not seen an ophthalmologist this year. Patient denies any issues with their feet.   GERD Patient is currently on no medication and diet control.  She denies any major symptoms or abdominal pain or belching or burping. She denies any blood in her stool or lightheadedness or dizziness.   bph and elevated psa Patient's psa is 5.1 down from 5.8, he did see urology and they wanted him to recheck in 6 months which would be at his next visit in October so we will recheck it then  Left shoulder pain He will call orthopedic if it worsens, the injection helped a little but not significantly.  Patient denies any loss of range of motion but just has pain with certain movements especially overhead and reaching and grabbing things.  No diagnosis found.  Outpatient Encounter Medications as of 08/09/2018  Medication Sig  . aspirin 81 MG tablet Take 81 mg by mouth daily.    . calcium carbonate (TUMS - DOSED IN MG ELEMENTAL CALCIUM) 500 MG chewable  tablet Chew 1-2 tablets by mouth daily as needed for indigestion or heartburn.  . Cholecalciferol (VITAMIN D) 2000 units CAPS Take 2,000 Units by mouth daily.    No facility-administered encounter medications on file as of 08/09/2018.     Review of Systems  Constitutional: Negative for chills and fever.  Respiratory: Negative for shortness of breath and wheezing.   Cardiovascular: Negative for chest pain and leg swelling.  Musculoskeletal: Negative for back pain and gait problem.  Skin: Negative for rash.  Neurological: Negative for dizziness, weakness and numbness.  All other systems reviewed and are negative.   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 The patient was provided an opportunity to  ask questions and all were answered. The patient agreed with the plan and demonstrated an understanding of the instructions.   The patient was advised to call back or seek an in-person evaluation if the symptoms worsen or if the condition fails to improve as anticipated.  The above assessment and management plan was discussed with the patient. The patient verbalized understanding of and has agreed to the management plan. Patient is aware to call the  clinic if symptoms persist or worsen. Patient is aware when to return to the clinic for a follow-up visit. Patient educated on when it is appropriate to go to the emergency department.    I provided 17 minutes of non-face-to-face time during this encounter.    Worthy Rancher, MD

## 2018-09-11 ENCOUNTER — Other Ambulatory Visit: Payer: Self-pay

## 2018-09-12 ENCOUNTER — Ambulatory Visit (INDEPENDENT_AMBULATORY_CARE_PROVIDER_SITE_OTHER): Payer: BC Managed Care – PPO | Admitting: *Deleted

## 2018-09-12 DIAGNOSIS — Z23 Encounter for immunization: Secondary | ICD-10-CM | POA: Diagnosis not present

## 2018-10-04 LAB — HM DIABETES EYE EXAM

## 2018-10-15 ENCOUNTER — Encounter: Payer: Self-pay | Admitting: Family Medicine

## 2018-10-15 DIAGNOSIS — E1169 Type 2 diabetes mellitus with other specified complication: Secondary | ICD-10-CM

## 2018-10-15 DIAGNOSIS — N400 Enlarged prostate without lower urinary tract symptoms: Secondary | ICD-10-CM

## 2018-10-16 ENCOUNTER — Other Ambulatory Visit: Payer: Self-pay

## 2018-10-16 ENCOUNTER — Other Ambulatory Visit: Payer: BC Managed Care – PPO

## 2018-10-16 DIAGNOSIS — E1169 Type 2 diabetes mellitus with other specified complication: Secondary | ICD-10-CM

## 2018-10-16 DIAGNOSIS — N400 Enlarged prostate without lower urinary tract symptoms: Secondary | ICD-10-CM

## 2018-10-16 LAB — BAYER DCA HB A1C WAIVED: HB A1C (BAYER DCA - WAIVED): 5.5 % (ref <7.0)

## 2018-10-17 LAB — PSA, TOTAL AND FREE
PSA, Free Pct: 22.9 %
PSA, Free: 1.72 ng/mL
Prostate Specific Ag, Serum: 7.5 ng/mL — ABNORMAL HIGH (ref 0.0–4.0)

## 2018-10-21 DIAGNOSIS — N486 Induration penis plastica: Secondary | ICD-10-CM | POA: Insufficient documentation

## 2018-11-04 ENCOUNTER — Encounter: Payer: Self-pay | Admitting: Family Medicine

## 2018-11-04 ENCOUNTER — Ambulatory Visit: Payer: BC Managed Care – PPO | Admitting: Family Medicine

## 2018-11-04 ENCOUNTER — Other Ambulatory Visit: Payer: Self-pay

## 2018-11-04 VITALS — BP 140/79 | HR 69 | Temp 98.0°F | Ht 69.0 in | Wt 206.4 lb

## 2018-11-04 DIAGNOSIS — E1169 Type 2 diabetes mellitus with other specified complication: Secondary | ICD-10-CM | POA: Diagnosis not present

## 2018-11-04 DIAGNOSIS — R972 Elevated prostate specific antigen [PSA]: Secondary | ICD-10-CM

## 2018-11-04 DIAGNOSIS — R42 Dizziness and giddiness: Secondary | ICD-10-CM | POA: Diagnosis not present

## 2018-11-04 NOTE — Progress Notes (Signed)
BP 140/79   Pulse 69   Temp 98 F (36.7 C) (Temporal)   Ht 5\' 9"  (1.753 m)   Wt 206 lb 6.4 oz (93.6 kg)   SpO2 97%   BMI 30.48 kg/m    Subjective:   Patient ID: Jimmy Mathis, male    DOB: 01-11-1960, 58 y.o.   MRN: [id number]  HPI: Jimmy Mathis is a 58 y.o. male presenting on 11/04/2018 for Diabetes and Dizziness (Patient states it has been going on x 2-3 weeks and happens sometimes when he turns his head or after laying down for awhile )   HPI Type 2 diabetes mellitus Patient comes in today for recheck of his diabetes. Patient has been currently taking no medication his A1c is 5.5. Patient is not currently on an ACE inhibitor/ARB. Patient has not seen an ophthalmologist this year. Patient denies any issues with their feet.   Patient comes in complaining of dizziness and spinning sensation that happens and passes very briefly, he says is been occurring over the past 2 weeks, he says occurs when he is laying down and when he turns his head in a certain way and then things will spin and will pass after a few seconds and then he will be fine.  He says this is been going on just recently and is never had that before.  He denies any sinus pressure congestion or fevers or chills.  Patient had an elevated PSA and he already saw urology and is going for biopsy later this next month.  Relevant past medical, surgical, family and social history reviewed and updated as indicated. Interim medical history since our last visit reviewed. Allergies and medications reviewed and updated.  Review of Systems  Constitutional: Negative for chills and fever.  Eyes: Negative for visual disturbance.  Respiratory: Negative for shortness of breath and wheezing.   Cardiovascular: Negative for chest pain and leg swelling.  Musculoskeletal: Negative for back pain and gait problem.  Skin: Negative for rash.  Neurological: Positive for dizziness. Negative for weakness, light-headedness, numbness and  headaches.  All other systems reviewed and are negative.   Per HPI unless specifically indicated above   Allergies as of 11/04/2018      Reactions   Penicillins Nausea And Vomiting, Swelling   High fever Has patient had a PCN reaction causing immediate rash, facial/tongue/throat swelling, SOB or lightheadedness with hypotension: No Has patient had a PCN reaction causing severe rash involving mucus membranes or skin necrosis: No Has patient had a PCN reaction that required hospitalization: No Has patient had a PCN reaction occurring within the last 10 years: No If all of the above answers are "NO", then may proceed with Cephalosporin use.   Procaine Hcl Swelling   Novocaine   Levaquin [levofloxacin] Other (See Comments)   Caused pain in tendons   Minocycline Other (See Comments)   Severe headache   Tetracyclines & Related    Headache      Medication List       Accurate as of November 04, 2018  4:18 PM. If you have any questions, ask your nurse or doctor.        aspirin 81 MG tablet Take 81 mg by mouth daily.   calcium carbonate 500 MG chewable tablet Commonly known as: TUMS - dosed in mg elemental calcium Chew 1-2 tablets by mouth daily as needed for indigestion or heartburn.   tadalafil 5 MG tablet Commonly known as: CIALIS Take by mouth.   Vitamin  D 50 MCG (2000 UT) Caps Take 2,000 Units by mouth daily.        Objective:   BP 140/79   Pulse 69   Temp 98 F (36.7 C) (Temporal)   Ht 5\' 9"  (1.753 m)   Wt 206 lb 6.4 oz (93.6 kg)   SpO2 97%   BMI 30.48 kg/m   Wt Readings from Last 3 Encounters:  11/04/18 206 lb 6.4 oz (93.6 kg)  05/08/18 219 lb 3.2 oz (99.4 kg)  02/06/18 221 lb 9.6 oz (100.5 kg)    Physical Exam Vitals signs and nursing note reviewed.  Constitutional:      General: He is not in acute distress.    Appearance: He is well-developed. He is not diaphoretic.  Eyes:     General: No scleral icterus.    Conjunctiva/sclera: Conjunctivae  normal.  Neck:     Musculoskeletal: Neck supple.     Thyroid: No thyromegaly.  Cardiovascular:     Rate and Rhythm: Normal rate and regular rhythm.     Heart sounds: Normal heart sounds. No murmur.  Pulmonary:     Effort: Pulmonary effort is normal. No respiratory distress.     Breath sounds: Normal breath sounds. No wheezing.  Musculoskeletal: Normal range of motion.  Lymphadenopathy:     Cervical: No cervical adenopathy.  Skin:    General: Skin is warm and dry.     Findings: No rash.  Neurological:     Mental Status: He is alert and oriented to person, place, and time.     Coordination: Coordination normal.  Psychiatric:        Behavior: Behavior normal.       Assessment & Plan:   Problem List Items Addressed This Visit      Endocrine   Type 2 diabetes mellitus with other specified complication ([place]) - Primary   Relevant Orders   Microalbumin / creatinine urine ratio     Other   Elevated prostate specific antigen (PSA)    Other Visit Diagnoses    Vertigo          Gave a handout on vertigo and recommended Antivert if continues.  Patient will continue with diet control, his blood pressure is right on the border of needing to treat versus not needing to treat we discussed this and he will continue to do diet and exercise and will let us know if it is running up and wants to treat. Follow up plan: Return in about 3 months (around 02/04/2019), or if symptoms worsen or fail to improve, for Hypertension and diabetes.  Counseling provided for all of the vaccine components Orders Placed This Encounter  Procedures  . Microalbumin / creatinine urine ratio    Caryl Pina, MD [facility] 11/04/2018, 4:18 PM

## 2019-01-31 ENCOUNTER — Ambulatory Visit: Payer: BC Managed Care – PPO | Attending: Internal Medicine

## 2019-01-31 ENCOUNTER — Other Ambulatory Visit: Payer: Self-pay

## 2019-01-31 DIAGNOSIS — Z20822 Contact with and (suspected) exposure to covid-19: Secondary | ICD-10-CM

## 2019-02-01 LAB — NOVEL CORONAVIRUS, NAA: SARS-CoV-2, NAA: NOT DETECTED

## 2019-02-06 ENCOUNTER — Encounter: Payer: Self-pay | Admitting: Family Medicine

## 2019-02-06 DIAGNOSIS — Z202 Contact with and (suspected) exposure to infections with a predominantly sexual mode of transmission: Secondary | ICD-10-CM

## 2019-02-06 DIAGNOSIS — E1169 Type 2 diabetes mellitus with other specified complication: Secondary | ICD-10-CM

## 2019-02-06 DIAGNOSIS — R972 Elevated prostate specific antigen [PSA]: Secondary | ICD-10-CM

## 2019-02-06 DIAGNOSIS — E559 Vitamin D deficiency, unspecified: Secondary | ICD-10-CM

## 2019-02-07 ENCOUNTER — Other Ambulatory Visit: Payer: Self-pay

## 2019-02-10 ENCOUNTER — Ambulatory Visit: Payer: BC Managed Care – PPO | Admitting: Family Medicine

## 2019-02-10 ENCOUNTER — Other Ambulatory Visit: Payer: Self-pay

## 2019-02-10 ENCOUNTER — Encounter: Payer: Self-pay | Admitting: Family Medicine

## 2019-02-10 VITALS — BP 118/62 | HR 71 | Temp 97.8°F | Ht 69.0 in | Wt 205.2 lb

## 2019-02-10 DIAGNOSIS — E559 Vitamin D deficiency, unspecified: Secondary | ICD-10-CM

## 2019-02-10 DIAGNOSIS — N400 Enlarged prostate without lower urinary tract symptoms: Secondary | ICD-10-CM

## 2019-02-10 DIAGNOSIS — E669 Obesity, unspecified: Secondary | ICD-10-CM

## 2019-02-10 DIAGNOSIS — K219 Gastro-esophageal reflux disease without esophagitis: Secondary | ICD-10-CM | POA: Diagnosis not present

## 2019-02-10 DIAGNOSIS — Z202 Contact with and (suspected) exposure to infections with a predominantly sexual mode of transmission: Secondary | ICD-10-CM

## 2019-02-10 DIAGNOSIS — Z6833 Body mass index (BMI) 33.0-33.9, adult: Secondary | ICD-10-CM

## 2019-02-10 DIAGNOSIS — E1169 Type 2 diabetes mellitus with other specified complication: Secondary | ICD-10-CM | POA: Diagnosis not present

## 2019-02-10 DIAGNOSIS — R972 Elevated prostate specific antigen [PSA]: Secondary | ICD-10-CM | POA: Diagnosis not present

## 2019-02-10 LAB — BAYER DCA HB A1C WAIVED: HB A1C (BAYER DCA - WAIVED): 5.4 % (ref <7.0)

## 2019-02-10 NOTE — Progress Notes (Signed)
BP 118/62   Pulse 71   Temp 97.8 F (36.6 C) (Temporal)   Ht '5\' 9"'  (1.753 m)   Wt 205 lb 3.2 oz (93.1 kg)   SpO2 97%   BMI 30.30 kg/m    Subjective:   Patient ID: Jimmy Mathis, male    DOB: 09-21-1960, 59 y.o.   MRN: [id number]  HPI: Jimmy Mathis is a 59 y.o. male presenting on 02/10/2019 for Diabetes (3 month follow up)   HPI Type 2 diabetes mellitus Patient comes in today for recheck of his diabetes. Patient has been currently taking no medication currently has been diet controlled. Patient is not currently on an ACE inhibitor/ARB. Patient has not seen an ophthalmologist this year. Patient denies any issues with their feet.   Hyperlipidemia Patient is coming in for recheck of his hyperlipidemia. The patient is currently taking no medication currently and has been diet controlled. They deny any issues with myalgias or history of liver damage from it. They deny any focal numbness or weakness or chest pain.  Patient continues to work on weight loss with activity and change of diet and feels like he is doing a lot better with it.  Patient is coming in for vitamin D recheck, take supplement.  GERD Patient is currently on no medication currently and denies any major issues right now.  he denies any major symptoms or abdominal pain or belching or burping. She denies any blood in her stool or lightheadedness or dizziness.   BPH Patient is coming in for recheck on BPH Symptoms: None Medication: Cialis daily 5 mg Last PSA: 7.5 on 10/16/2018  Relevant past medical, surgical, family and social history reviewed and updated as indicated. Interim medical history since our last visit reviewed. Allergies and medications reviewed and updated.  Review of Systems  Constitutional: Negative for chills and fever.  Eyes: Negative for visual disturbance.  Respiratory: Negative for shortness of breath and wheezing.   Cardiovascular: Negative for chest pain and leg swelling.  Musculoskeletal:  Negative for back pain and gait problem.  Skin: Negative for rash.  Neurological: Negative for dizziness, weakness and light-headedness.  All other systems reviewed and are negative.   Per HPI unless specifically indicated above   Allergies as of 02/10/2019      Reactions   Penicillins Nausea And Vomiting, Swelling   High fever Has patient had a PCN reaction causing immediate rash, facial/tongue/throat swelling, SOB or lightheadedness with hypotension: No Has patient had a PCN reaction causing severe rash involving mucus membranes or skin necrosis: No Has patient had a PCN reaction that required hospitalization: No Has patient had a PCN reaction occurring within the last 10 years: No If all of the above answers are "NO", then may proceed with Cephalosporin use.   Procaine Hcl Swelling   Novocaine   Levaquin [levofloxacin] Other (See Comments)   Caused pain in tendons   Minocycline Other (See Comments)   Severe headache   Tetracyclines & Related    Headache      Medication List       Accurate as of February 10, 2019  4:05 PM. If you have any questions, ask your nurse or doctor.        aspirin 81 MG tablet Take 81 mg by mouth daily.   calcium carbonate 500 MG chewable tablet Commonly known as: TUMS - dosed in mg elemental calcium Chew 1-2 tablets by mouth daily as needed for indigestion or heartburn.   tadalafil 5 MG  tablet Commonly known as: CIALIS Take by mouth.   Vitamin D 50 MCG (2000 UT) Caps Take 2,000 Units by mouth daily.        Objective:   BP 118/62   Pulse 71   Temp 97.8 F (36.6 C) (Temporal)   Ht '5\' 9"'  (1.753 m)   Wt 205 lb 3.2 oz (93.1 kg)   SpO2 97%   BMI 30.30 kg/m   Wt Readings from Last 3 Encounters:  02/10/19 205 lb 3.2 oz (93.1 kg)  11/04/18 206 lb 6.4 oz (93.6 kg)  05/08/18 219 lb 3.2 oz (99.4 kg)    Physical Exam Vitals and nursing note reviewed.  Constitutional:      General: He is not in acute distress.    Appearance: He is  well-developed. He is not diaphoretic.  Eyes:     General: No scleral icterus.    Conjunctiva/sclera: Conjunctivae normal.  Neck:     Thyroid: No thyromegaly.  Cardiovascular:     Rate and Rhythm: Normal rate and regular rhythm.     Heart sounds: Normal heart sounds. No murmur.  Pulmonary:     Effort: Pulmonary effort is normal. No respiratory distress.     Breath sounds: Normal breath sounds. No wheezing.  Musculoskeletal:        General: Normal range of motion.     Cervical back: Neck supple.  Lymphadenopathy:     Cervical: No cervical adenopathy.  Skin:    General: Skin is warm and dry.     Findings: No rash.  Neurological:     Mental Status: He is alert and oriented to person, place, and time.     Coordination: Coordination normal.  Psychiatric:        Behavior: Behavior normal.     Assessment & Plan:   Problem List Items Addressed This Visit      Digestive   GERD   Relevant Orders   CBC with Differential/Platelet     Endocrine   Type 2 diabetes mellitus with other specified complication ([place]) - Primary   Relevant Orders   Bayer DCA Hb A1c Waived   CBC with Differential/Platelet   CMP14+EGFR   Lipid panel     Genitourinary   BPH (benign prostatic hyperplasia)   Relevant Orders   PSA, total and free     Other   Elevated prostate specific antigen (PSA)   Obesity   Relevant Orders   Lipid panel   Vitamin D deficiency   Relevant Orders   VITAMIN D 25 Hydroxy (Vit-D Deficiency, Fractures)    Other Visit Diagnoses    Possible exposure to STD       Relevant Orders   STD Screen (8)   Chlamydia/Gonococcus/Trichomonas, NAA      Currently patient's blood sugars are doing well, will check A1c. STD panel checking every year, will check it again.  His PSA was up last time, we will recheck that as well.  A1c is 5.4 and looks good, will see back in 3 months Follow up plan: Return in about 3 months (around 05/10/2019), or if symptoms worsen or fail to improve,  for Prediabetes and hypertension.  Counseling provided for all of the vaccine components Orders Placed This Encounter  Procedures  . Chlamydia/Gonococcus/Trichomonas, NAA  . Bayer DCA Hb A1c Waived  . CBC with Differential/Platelet  . CMP14+EGFR  . Lipid panel  . VITAMIN D 25 Hydroxy (Vit-D Deficiency, Fractures)  . PSA, total and free  . STD Screen (8)  Caryl Pina, MD [facility] 02/10/2019, 4:05 PM

## 2019-02-11 LAB — CBC WITH DIFFERENTIAL/PLATELET
Basophils Absolute: 0.1 10*3/uL (ref 0.0–0.2)
Basos: 1 %
EOS (ABSOLUTE): 0.1 10*3/uL (ref 0.0–0.4)
Eos: 2 %
Hematocrit: 47.2 % (ref 37.5–51.0)
Hemoglobin: 15.8 g/dL (ref 13.0–17.7)
Immature Grans (Abs): 0.1 10*3/uL (ref 0.0–0.1)
Immature Granulocytes: 1 %
Lymphocytes Absolute: 1.5 10*3/uL (ref 0.7–3.1)
Lymphs: 22 %
MCH: 28.7 pg (ref 26.6–33.0)
MCHC: 33.5 g/dL (ref 31.5–35.7)
MCV: 86 fL (ref 79–97)
Monocytes Absolute: 0.6 10*3/uL (ref 0.1–0.9)
Monocytes: 8 %
Neutrophils Absolute: 4.5 10*3/uL (ref 1.4–7.0)
Neutrophils: 66 %
Platelets: 224 10*3/uL (ref 150–450)
RBC: 5.51 x10E6/uL (ref 4.14–5.80)
RDW: 13.2 % (ref 11.6–15.4)
WBC: 6.8 10*3/uL (ref 3.4–10.8)

## 2019-02-11 LAB — LIPID PANEL
Chol/HDL Ratio: 3.6 ratio (ref 0.0–5.0)
Cholesterol, Total: 133 mg/dL (ref 100–199)
HDL: 37 mg/dL — ABNORMAL LOW (ref >39)
LDL Chol Calc (NIH): 76 mg/dL (ref 0–99)
Triglycerides: 109 mg/dL (ref 0–149)
VLDL Cholesterol Cal: 20 mg/dL (ref 5–40)

## 2019-02-11 LAB — CMP14+EGFR
ALT: 22 IU/L (ref 0–44)
AST: 18 IU/L (ref 0–40)
Albumin/Globulin Ratio: 2 (ref 1.2–2.2)
Albumin: 4.3 g/dL (ref 3.8–4.9)
Alkaline Phosphatase: 108 IU/L (ref 39–117)
BUN/Creatinine Ratio: 10 (ref 9–20)
BUN: 9 mg/dL (ref 6–24)
Bilirubin Total: 0.3 mg/dL (ref 0.0–1.2)
CO2: 23 mmol/L (ref 20–29)
Calcium: 9.1 mg/dL (ref 8.7–10.2)
Chloride: 103 mmol/L (ref 96–106)
Creatinine, Ser: 0.88 mg/dL (ref 0.76–1.27)
GFR calc Af Amer: 109 mL/min/{1.73_m2} (ref >59)
GFR calc non Af Amer: 94 mL/min/{1.73_m2} (ref >59)
Globulin, Total: 2.1 g/dL (ref 1.5–4.5)
Glucose: 92 mg/dL (ref 65–99)
Potassium: 4.6 mmol/L (ref 3.5–5.2)
Sodium: 139 mmol/L (ref 134–144)
Total Protein: 6.4 g/dL (ref 6.0–8.5)

## 2019-02-11 LAB — PSA, TOTAL AND FREE
PSA, Free Pct: 14.4 %
PSA, Free: 1.8 ng/mL
Prostate Specific Ag, Serum: 12.5 ng/mL — ABNORMAL HIGH (ref 0.0–4.0)

## 2019-02-11 LAB — STD SCREEN (8)
HIV Screen 4th Generation wRfx: NONREACTIVE
HSV 1 Glycoprotein G Ab, IgG: <0.91 index (ref 0.00–0.90)
HSV 2 IgG, Type Spec: <0.91 index (ref 0.00–0.90)
Hep A IgM: NEGATIVE
Hep B C IgM: NEGATIVE
Hep C Virus Ab: <0.1 s/co ratio (ref 0.0–0.9)
Hepatitis B Surface Ag: NEGATIVE
RPR Ser Ql: NONREACTIVE

## 2019-02-11 LAB — VITAMIN D 25 HYDROXY (VIT D DEFICIENCY, FRACTURES): Vit D, 25-Hydroxy: 66.8 ng/mL (ref 30.0–100.0)

## 2019-02-12 LAB — CHLAMYDIA/GONOCOCCUS/TRICHOMONAS, NAA
Chlamydia by NAA: NEGATIVE
Gonococcus by NAA: NEGATIVE
Trich vag by NAA: NEGATIVE

## 2019-02-18 ENCOUNTER — Ambulatory Visit: Payer: BC Managed Care – PPO | Attending: Internal Medicine

## 2019-02-18 ENCOUNTER — Other Ambulatory Visit: Payer: Self-pay

## 2019-02-18 DIAGNOSIS — Z20822 Contact with and (suspected) exposure to covid-19: Secondary | ICD-10-CM

## 2019-02-19 ENCOUNTER — Telehealth: Payer: Self-pay | Admitting: Family Medicine

## 2019-02-19 LAB — NOVEL CORONAVIRUS, NAA: SARS-CoV-2, NAA: DETECTED — AB

## 2019-02-19 NOTE — Progress Notes (Signed)
Your test for COVID-19 was positive ("detected"), meaning that you were infected with the novel coronavirus and could give the germ to others.    Please continue isolation at home, for at least 10 days since the start of your fever/cough/breathlessness and until you have had 24 hours without fever (without taking a fever reducer) and with any cough/breathlessness improving. Use over-the-counter medications for symptoms.  If you have had no symptoms, but were exposed to someone who was positive for COVID-19, you will need to quarantine and self-isolate for 14 days from the date of exposure.    Please continue good preventive care measures, including:  frequent hand-washing, avoid touching your face, cover coughs/sneezes, stay out of crowds and keep a 6 foot distance from others.  Clean hard surfaces touched frequently with disinfectant cleaning products.   Please check in with your primary care provider about your positive test result.  Go to the nearest urgent care or ED for assessment if you have severe breathlessness or severe weakness/fatigue (ex needing new help getting out of bed or to the bathroom).  Members of your household will also need to quarantine for 14 days from the date of your positive test. You may be contacted to discuss possible treatment options, and you may also be contacted by the health department for follow up. Please call [facility] at [phone] if you have any questions or concerns.

## 2019-02-20 ENCOUNTER — Other Ambulatory Visit (HOSPITAL_COMMUNITY): Payer: Self-pay | Admitting: Nurse Practitioner

## 2019-02-20 ENCOUNTER — Encounter: Payer: Self-pay | Admitting: Nurse Practitioner

## 2019-02-20 ENCOUNTER — Telehealth (HOSPITAL_COMMUNITY): Payer: Self-pay | Admitting: Nurse Practitioner

## 2019-02-20 DIAGNOSIS — U071 COVID-19: Secondary | ICD-10-CM

## 2019-02-20 NOTE — Progress Notes (Signed)
I connected by phone with Jimmy Mathis on 02/20/2019 at 10:17 AM to discuss the potential use of an new treatment for mild to moderate COVID-19 viral infection in non-hospitalized patients.  This patient is a 59 y.o. male that meets the FDA criteria for Emergency Use Authorization of bamlanivimab or casirivimab\imdevimab.  Has a (+) direct SARS-CoV-2 viral test result  Has mild or moderate COVID-19   Is ? 60 years of age and weighs ? 40 kg  Is NOT hospitalized due to COVID-19  Is NOT requiring oxygen therapy or requiring an increase in baseline oxygen flow rate due to COVID-19  Is within 10 days of symptom onset  Has at least one of the high risk factor(s) for progression to severe COVID-19 and/or hospitalization as defined in EUA.  Specific high risk criteria : Diabetes  I have spoken and communicated the following to the patient or parent/caregiver:  1. FDA has authorized the emergency use of bamlanivimab and casirivimab\imdevimab for the treatment of mild to moderate COVID-19 in adults and pediatric patients with positive results of direct SARS-CoV-2 viral testing who are 71 years of age and older weighing at least 40 kg, and who are at high risk for progressing to severe COVID-19 and/or hospitalization.  2. The significant known and potential risks and benefits of bamlanivimab and casirivimab\imdevimab, and the extent to which such potential risks and benefits are unknown.  3. Information on available alternative treatments and the risks and benefits of those alternatives, including clinical trials.  4. Patients treated with bamlanivimab and casirivimab\imdevimab should continue to self-isolate and use infection control measures (e.g., wear mask, isolate, social distance, avoid sharing personal items, clean and disinfect "high touch" surfaces, and frequent handwashing) according to CDC guidelines.   5. The patient or parent/caregiver has the option to accept or refuse bamlanivimab  or casirivimab\imdevimab .  After reviewing this information with the patient, The patient agreed to proceed with receiving the bamlanimivab infusion and will be provided a copy of the Fact sheet prior to receiving the infusion.Beckey Rutter, [place], AGNP-C [phone] ([place])

## 2019-02-20 NOTE — Telephone Encounter (Signed)
Pain to know what day he tested positive and what day his symptoms started and then I will put his name into a pool for the infusion center today and they will review it and see if he qualifies.  They are the ones who determine if he qualifies or not based on their criteria which he does meet some but not all of their criteria so we will have to see what they say but I do need to know when he tested positive and when his symptoms started.  I also need to know if he is having shortness of breath or requiring oxygen, I do not believe he is but if he is I need to know that as well. Thanks Caryl Pina, MD [facility] 02/20/2019, 7:47 AM

## 2019-02-20 NOTE — Telephone Encounter (Signed)
Patient aware.

## 2019-02-20 NOTE — Telephone Encounter (Signed)
FYI for provider.       Patient had symptoms on Monday which were fever,cough, congestion.  He has not had shortness of breath and O 2 has been fine.   Tuesday he went to be tested and results came back on Wednesday.

## 2019-02-20 NOTE — Telephone Encounter (Signed)
Please let him know that I have spoken with the infusion center left a message and they will contact him after they evaluate today Caryl Pina, MD [facility] 02/20/2019, 8:47 AM

## 2019-02-20 NOTE — Telephone Encounter (Signed)
Called to Discuss with patient about Covid symptoms and the use of bamlanivimab, a monoclonal antibody infusion for those with mild to moderate Covid symptoms and at a high risk of hospitalization.     Pt is qualified for this infusion at the [facility] infusion center due to co-morbid conditions and/or a member of an at-risk group.  Orders entered.    Beckey Rutter, [place], AGNP-C [phone] ([place])

## 2019-02-21 ENCOUNTER — Ambulatory Visit (HOSPITAL_COMMUNITY)
Admission: RE | Admit: 2019-02-21 | Discharge: 2019-02-21 | Disposition: A | Discharge status: Home / Self Care | Payer: BC Managed Care – PPO | Source: Ambulatory Visit | Attending: Pulmonary Disease | Admitting: Pulmonary Disease

## 2019-02-21 DIAGNOSIS — U071 COVID-19: Secondary | ICD-10-CM | POA: Insufficient documentation

## 2019-02-21 DIAGNOSIS — Z23 Encounter for immunization: Secondary | ICD-10-CM | POA: Insufficient documentation

## 2019-02-21 MED ORDER — SODIUM CHLORIDE 0.9 % IV SOLN
700.0000 mg | Freq: Once | INTRAVENOUS | Status: AC
  Administered 2019-02-21: 10:00:00 700 mg via INTRAVENOUS
  Filled 2019-02-21: qty 20

## 2019-02-21 MED ORDER — METHYLPREDNISOLONE SODIUM SUCC 125 MG IJ SOLR: 125.0000 mg | Freq: Once | INTRAMUSCULAR | Status: DC | PRN

## 2019-02-21 MED ORDER — ALBUTEROL SULFATE HFA 108 (90 BASE) MCG/ACT IN AERS: 2.0000 | INHALATION_SPRAY | Freq: Once | RESPIRATORY_TRACT | Status: DC | PRN

## 2019-02-21 MED ORDER — EPINEPHRINE 0.3 MG/0.3ML IJ SOAJ: 0.3000 mg | Freq: Once | INTRAMUSCULAR | Status: DC | PRN

## 2019-02-21 MED ORDER — DIPHENHYDRAMINE HCL 50 MG/ML IJ SOLN: 50.0000 mg | Freq: Once | INTRAMUSCULAR | Status: DC | PRN

## 2019-02-21 MED ORDER — SODIUM CHLORIDE 0.9 % IV SOLN
INTRAVENOUS | Status: DC | PRN
  Administered 2019-02-21: 250 mL via INTRAVENOUS

## 2019-02-21 MED ORDER — FAMOTIDINE IN NACL 20-0.9 MG/50ML-% IV SOLN: 20.0000 mg | Freq: Once | INTRAVENOUS | Status: DC | PRN

## 2019-02-21 NOTE — Discharge Instructions (Signed)

## 2019-02-21 NOTE — Progress Notes (Signed)
  Diagnosis: COVID-19  Physician: Dr. Joya Gaskins  Procedure: Covid Infusion Clinic Med: bamlanivimab infusion - Provided patient with bamlanimivab fact sheet for patients, parents and caregivers prior to infusion.  Complications: No immediate complications noted.  Discharge: Discharged home   Brance Dartt N Milo Schreier 02/21/2019

## 2019-05-12 ENCOUNTER — Ambulatory Visit: Payer: BC Managed Care – PPO | Admitting: Family Medicine

## 2019-05-13 ENCOUNTER — Ambulatory Visit: Payer: BC Managed Care – PPO | Attending: Internal Medicine

## 2019-05-13 ENCOUNTER — Other Ambulatory Visit: Payer: Self-pay

## 2019-05-13 DIAGNOSIS — Z20822 Contact with and (suspected) exposure to covid-19: Secondary | ICD-10-CM

## 2019-05-14 LAB — SARS-COV-2, NAA 2 DAY TAT

## 2019-05-14 LAB — NOVEL CORONAVIRUS, NAA: SARS-CoV-2, NAA: NOT DETECTED

## 2019-05-17 ENCOUNTER — Ambulatory Visit: Payer: BC Managed Care – PPO | Attending: Internal Medicine

## 2019-05-17 DIAGNOSIS — Z23 Encounter for immunization: Secondary | ICD-10-CM

## 2019-05-17 NOTE — Progress Notes (Signed)
   Covid-19 Vaccination Clinic  Name:  Jimmy Mathis    MRN: [id number] DOB: Oct 17, 1960  05/17/2019  [name] was observed post Covid-19 immunization for 15 minutes without incident. He was provided with Vaccine Information Sheet and instruction to access the V-Safe system.   [name] was instructed to call 911 with any severe reactions post vaccine: Marland Kitchen Difficulty breathing  . Swelling of face and throat  . A fast heartbeat  . A bad rash all over body  . Dizziness and weakness   Immunizations Administered    Name Date Dose VIS Date Route   Pfizer COVID-19 Vaccine 05/17/2019  8:51 AM 0.3 mL 03/05/2018 Intramuscular   Manufacturer: [place]   Lot: [id number]   [place]: [id number]

## 2019-05-30 ENCOUNTER — Other Ambulatory Visit: Payer: Self-pay

## 2019-05-30 ENCOUNTER — Ambulatory Visit: Payer: BC Managed Care – PPO | Admitting: Family Medicine

## 2019-05-30 ENCOUNTER — Encounter: Payer: Self-pay | Admitting: Family Medicine

## 2019-05-30 VITALS — BP 117/44 | HR 82 | Temp 98.3°F | Ht 69.0 in | Wt 202.4 lb

## 2019-05-30 DIAGNOSIS — E1169 Type 2 diabetes mellitus with other specified complication: Secondary | ICD-10-CM

## 2019-05-30 DIAGNOSIS — K219 Gastro-esophageal reflux disease without esophagitis: Secondary | ICD-10-CM

## 2019-05-30 DIAGNOSIS — Z113 Encounter for screening for infections with a predominantly sexual mode of transmission: Secondary | ICD-10-CM

## 2019-05-30 DIAGNOSIS — R319 Hematuria, unspecified: Secondary | ICD-10-CM | POA: Diagnosis not present

## 2019-05-30 DIAGNOSIS — R197 Diarrhea, unspecified: Secondary | ICD-10-CM

## 2019-05-30 LAB — URINALYSIS, COMPLETE
Bilirubin, UA: NEGATIVE
Glucose, UA: NEGATIVE
Ketones, UA: NEGATIVE
Leukocytes,UA: NEGATIVE
Nitrite, UA: NEGATIVE
Protein,UA: NEGATIVE
RBC, UA: NEGATIVE
Specific Gravity, UA: >1.03 — ABNORMAL HIGH (ref 1.005–1.030)
Urobilinogen, Ur: 0.2 mg/dL (ref 0.2–1.0)
pH, UA: 5.5 (ref 5.0–7.5)

## 2019-05-30 LAB — MICROSCOPIC EXAMINATION
Bacteria, UA: NONE SEEN
Epithelial Cells (non renal): NONE SEEN /hpf (ref 0–10)
RBC, Urine: NONE SEEN /hpf (ref 0–2)
Renal Epithel, UA: NONE SEEN /hpf
WBC, UA: NONE SEEN /hpf (ref 0–5)

## 2019-05-30 LAB — BAYER DCA HB A1C WAIVED: HB A1C (BAYER DCA - WAIVED): 5.4 % (ref <7.0)

## 2019-05-30 NOTE — Progress Notes (Signed)
BP (!) 117/44   Pulse 82   Temp 98.3 F (36.8 C) (Temporal)   Ht 5\' 9"  (1.753 m)   Wt 202 lb 6 oz (91.8 kg)   BMI 29.89 kg/m    Subjective:   Patient ID: Jimmy Mathis, male    DOB: [birth year], 59 y.o.   MRN: [id number]  HPI: KAIDENN KROLL is a 59 y.o. male presenting on 05/30/2019 for Medical Management of Chronic Issues   HPI Type 2 diabetes mellitus Patient comes in today for recheck of his diabetes. Patient has been currently taking no medication and a1c 5.4. Patient is not currently on an ACE inhibitor/ARB. Patient has not seen an ophthalmologist this year. Patient denies any issues with their feet. The symptom started onset as an adult.  ARE RELATED TO DM   GERD Patient is currently on no medication currently but monitoring.  She denies any major symptoms or abdominal pain or belching or burping. She denies any blood in her stool or lightheadedness or dizziness.   Blood in ejaculate Patient has been having some blood in his ejaculate ever since his prostate biopsy.  This was just a couple weeks ago and he does have follow-up with urology.  He denies any pain or issues with urination.  Relevant past medical, surgical, family and social history reviewed and updated as indicated. Interim medical history since our last visit reviewed. Allergies and medications reviewed and updated.  Review of Systems  Constitutional: Negative for chills and fever.  Respiratory: Negative for shortness of breath and wheezing.   Cardiovascular: Negative for chest pain and leg swelling.  Gastrointestinal: Positive for diarrhea (Intermittent). Negative for abdominal pain.  Genitourinary: Negative for discharge, frequency, hematuria, penile pain and penile swelling.  Musculoskeletal: Negative for back pain and gait problem.  Skin: Negative for rash.  All other systems reviewed and are negative.   Per HPI unless specifically indicated above   Allergies as of 05/30/2019      Reactions   Penicillins Nausea And Vomiting, Swelling   High fever Has patient had a PCN reaction causing immediate rash, facial/tongue/throat swelling, SOB or lightheadedness with hypotension: No Has patient had a PCN reaction causing severe rash involving mucus membranes or skin necrosis: No Has patient had a PCN reaction that required hospitalization: No Has patient had a PCN reaction occurring within the last 10 years: No If all of the above answers are "NO", then may proceed with Cephalosporin use.   Procaine Hcl Swelling   Novocaine   Levaquin [levofloxacin] Other (See Comments)   Caused pain in tendons   Minocycline Other (See Comments)   Severe headache   Tetracyclines & Related    Headache      Medication List       Accurate as of May 30, 2019  4:18 PM. If you have any questions, ask your nurse or doctor.        aspirin 81 MG tablet Take 81 mg by mouth daily.   calcium carbonate 500 MG chewable tablet Commonly known as: TUMS - dosed in mg elemental calcium Chew 1-2 tablets by mouth daily as needed for indigestion or heartburn.   tadalafil 5 MG tablet Commonly known as: CIALIS Take by mouth.   Vitamin D 50 MCG (2000 UT) Caps Take 2,000 Units by mouth daily.        Objective:   BP (!) 117/44   Pulse 82   Temp 98.3 F (36.8 C) (Temporal)   Ht 5\' 9"  (1.753  m)   Wt 202 lb 6 oz (91.8 kg)   BMI 29.89 kg/m   Wt Readings from Last 3 Encounters:  05/30/19 202 lb 6 oz (91.8 kg)  02/10/19 205 lb 3.2 oz (93.1 kg)  11/04/18 206 lb 6.4 oz (93.6 kg)    Physical Exam Vitals and nursing note reviewed.  Constitutional:      General: He is not in acute distress.    Appearance: He is well-developed. He is not diaphoretic.  Eyes:     General: No scleral icterus.    Conjunctiva/sclera: Conjunctivae normal.  Neck:     Thyroid: No thyromegaly.  Cardiovascular:     Rate and Rhythm: Normal rate and regular rhythm.     Heart sounds: Normal heart sounds. No murmur.  Pulmonary:       Effort: Pulmonary effort is normal. No respiratory distress.     Breath sounds: Normal breath sounds. No wheezing.  Abdominal:     General: Abdomen is flat. Bowel sounds are normal. There is no distension.     Tenderness: There is no abdominal tenderness. There is no guarding or rebound.     Hernia: No hernia is present.  Musculoskeletal:        General: Normal range of motion.     Cervical back: Neck supple.  Lymphadenopathy:     Cervical: No cervical adenopathy.  Skin:    General: Skin is warm and dry.     Findings: No rash.  Neurological:     Mental Status: He is alert and oriented to person, place, and time.     Coordination: Coordination normal.  Psychiatric:        Behavior: Behavior normal.       Assessment & Plan:   Problem List Items Addressed This Visit      Digestive   GERD     Endocrine   Type 2 diabetes mellitus with other specified complication ([place]) - Primary   Relevant Orders   Bayer DCA Hb A1c Waived (Completed)   Microalbumin / creatinine urine ratio (Completed)    Other Visit Diagnoses    Screening examination for STD (sexually transmitted disease)       Relevant Orders   STD Screen (8) (Completed)   GC/Chlamydia Probe Amp (Completed)   Hematuria, unspecified type       Relevant Orders   Urinalysis, Complete (Completed)   Intermittent diarrhea          Discussed possibility with diarrhea of testing for celiac's or other causes of possible IBS or medication for IBS and he says is not not doing that yet but he does want to let us know.  Do not think it is cancer because he just had a colonoscopy in 2018 only had a couple polyps.  a1c 5.4 Follow up plan: Return in about 3 months (around 08/30/2019), or if symptoms worsen or fail to improve, for Diabetes and GERD recheck.  Counseling provided for all of the vaccine components Orders Placed This Encounter  Procedures  . GC/Chlamydia Probe Amp  . Bayer DCA Hb A1c Waived  . Microalbumin /  creatinine urine ratio  . STD Screen (8)  . Urinalysis, Complete    Caryl Pina, MD [facility] 05/30/2019, 4:18 PM

## 2019-05-31 LAB — STD SCREEN (8)
HIV Screen 4th Generation wRfx: NONREACTIVE
HSV 1 Glycoprotein G Ab, IgG: 2.78 index — ABNORMAL HIGH (ref 0.00–0.90)
HSV 2 IgG, Type Spec: <0.91 index (ref 0.00–0.90)
Hep A IgM: NEGATIVE
Hep B C IgM: NEGATIVE
Hep C Virus Ab: 0.1 s/co ratio (ref 0.0–0.9)
Hepatitis B Surface Ag: NEGATIVE
RPR Ser Ql: NONREACTIVE

## 2019-06-01 LAB — MICROALBUMIN / CREATININE URINE RATIO
Creatinine, Urine: 177.4 mg/dL
Microalb/Creat Ratio: 4 mg/g creat (ref 0–29)
Microalbumin, Urine: 7 ug/mL

## 2019-06-02 LAB — GC/CHLAMYDIA PROBE AMP
Chlamydia trachomatis, NAA: NEGATIVE
Neisseria Gonorrhoeae by PCR: NEGATIVE

## 2019-06-10 ENCOUNTER — Ambulatory Visit: Payer: BC Managed Care – PPO | Attending: Internal Medicine

## 2019-06-10 DIAGNOSIS — Z23 Encounter for immunization: Secondary | ICD-10-CM

## 2019-06-10 NOTE — Progress Notes (Signed)
   Covid-19 Vaccination Clinic  Name:  Jimmy Mathis    MRN: [id number] DOB: 06-11-1960  06/10/2019  [name] was observed post Covid-19 immunization for 15 minutes without incident. He was provided with Vaccine Information Sheet and instruction to access the V-Safe system.   [name] was instructed to call 911 with any severe reactions post vaccine: Marland Kitchen Difficulty breathing  . Swelling of face and throat  . A fast heartbeat  . A bad rash all over body  . Dizziness and weakness   Immunizations Administered    Name Date Dose VIS Date Route   Pfizer COVID-19 Vaccine 06/10/2019 12:23 PM 0.3 mL 03/05/2018 Intramuscular   Manufacturer: [place]   Lot: [id number]   [place]: [id number]

## 2019-08-12 ENCOUNTER — Encounter: Payer: Self-pay | Admitting: Family Medicine

## 2019-08-28 ENCOUNTER — Encounter: Payer: Self-pay | Admitting: Family Medicine

## 2019-11-20 ENCOUNTER — Encounter: Payer: Self-pay | Admitting: *Deleted

## 2019-11-23 ENCOUNTER — Encounter: Payer: Self-pay | Admitting: Family Medicine

## 2019-12-01 ENCOUNTER — Telehealth: Payer: Self-pay | Admitting: *Deleted

## 2019-12-01 ENCOUNTER — Ambulatory Visit: Payer: BC Managed Care – PPO | Admitting: Family Medicine

## 2019-12-01 ENCOUNTER — Encounter: Payer: Self-pay | Admitting: Family Medicine

## 2019-12-01 ENCOUNTER — Other Ambulatory Visit: Payer: Self-pay

## 2019-12-01 VITALS — BP 112/52 | HR 86 | Temp 98.0°F | Ht 69.0 in | Wt 212.0 lb

## 2019-12-01 DIAGNOSIS — N400 Enlarged prostate without lower urinary tract symptoms: Secondary | ICD-10-CM | POA: Diagnosis not present

## 2019-12-01 DIAGNOSIS — K219 Gastro-esophageal reflux disease without esophagitis: Secondary | ICD-10-CM | POA: Diagnosis not present

## 2019-12-01 DIAGNOSIS — E1169 Type 2 diabetes mellitus with other specified complication: Secondary | ICD-10-CM | POA: Diagnosis not present

## 2019-12-01 DIAGNOSIS — R972 Elevated prostate specific antigen [PSA]: Secondary | ICD-10-CM | POA: Diagnosis not present

## 2019-12-01 DIAGNOSIS — Z8619 Personal history of other infectious and parasitic diseases: Secondary | ICD-10-CM

## 2019-12-01 MED ORDER — TADALAFIL 5 MG PO TABS: 5.0000 mg | ORAL_TABLET | Freq: Every day | ORAL | 1 refills | Status: DC | PRN

## 2019-12-01 NOTE — Telephone Encounter (Signed)
Prior Auth  Tadalafil 5MG  tablets- IN PROCESS  Key: [id number]  Your information has been submitted to [facility]. To check for an updated outcome later, reopen this PA request from your dashboard.  If [employer] has not responded to your request within 24 hours, contact [place] at [phone]. If you think there may be a problem with your PA request, use our live chat feature at the bottom right.

## 2019-12-01 NOTE — Progress Notes (Signed)
BP (!) 112/52    Pulse 86    Temp 98 F (36.7 C)    Ht '5\' 9"'  (1.753 m)    Wt 200 lb (90.7 kg)    SpO2 97%    BMI 29.53 kg/m    Subjective:   Patient ID: Jimmy Mathis, male    DOB: Jun 10, 1960, 59 y.o.   MRN: [id number]  HPI: Jimmy Mathis is a 59 y.o. male presenting on 12/01/2019 for Medical Management of Chronic Issues and Diabetes   HPI Type 2 diabetes mellitus Patient comes in today for recheck of his diabetes. Patient has been currently taking no medication, will check A1c. Patient is not currently on an ACE inhibitor/ARB. Patient has seen an ophthalmologist this year. Patient denies any issues with their feet. The symptom started onset as an adult.  ARE RELATED TO DM   GERD Patient is currently on occasional Tums.  She denies any major symptoms or abdominal pain or belching or burping. She denies any blood in her stool or lightheadedness or dizziness.   BPH Patient is coming in for recheck on BPH Symptoms: None currently except for occasional erectile dysfunction Medication: Cialis as needed Last PSA: 12.5 on 02/10/2019, saw urology and had testing and biopsy, does not want a repeat PSAs  Relevant past medical, surgical, family and social history reviewed and updated as indicated. Interim medical history since our last visit reviewed. Allergies and medications reviewed and updated.  Review of Systems  Constitutional: Negative for chills and fever.  Eyes: Negative for visual disturbance.  Respiratory: Negative for shortness of breath and wheezing.   Cardiovascular: Negative for chest pain and leg swelling.  Musculoskeletal: Negative for back pain and gait problem.  Skin: Negative for rash.  Neurological: Negative for dizziness, weakness and light-headedness.  All other systems reviewed and are negative.   Per HPI unless specifically indicated above   Allergies as of 12/01/2019      Reactions   Penicillins Nausea And Vomiting, Swelling   High fever Has patient  had a PCN reaction causing immediate rash, facial/tongue/throat swelling, SOB or lightheadedness with hypotension: No Has patient had a PCN reaction causing severe rash involving mucus membranes or skin necrosis: No Has patient had a PCN reaction that required hospitalization: No Has patient had a PCN reaction occurring within the last 10 years: No If all of the above answers are "NO", then may proceed with Cephalosporin use.   Procaine Hcl Swelling   Novocaine   Levaquin [levofloxacin] Other (See Comments)   Caused pain in tendons   Minocycline Other (See Comments)   Severe headache   Tetracyclines & Related    Headache      Medication List       Accurate as of December 01, 2019  3:29 PM. If you have any questions, ask your nurse or doctor.        STOP taking these medications   aspirin 81 MG tablet Stopped by: Fransisca Kaufmann Nilza Eaker, MD     TAKE these medications   calcium carbonate 500 MG chewable tablet Commonly known as: TUMS - dosed in mg elemental calcium Chew 1-2 tablets by mouth daily as needed for indigestion or heartburn.   tadalafil 5 MG tablet Commonly known as: CIALIS Take by mouth.   Vitamin D 50 MCG (2000 UT) Caps Take 2,000 Units by mouth daily.        Objective:   BP (!) 112/52    Pulse 86    Temp  98 F (36.7 C)    Ht '5\' 9"'  (1.753 m)    Wt 200 lb (90.7 kg)    SpO2 97%    BMI 29.53 kg/m   Wt Readings from Last 3 Encounters:  12/01/19 200 lb (90.7 kg)  05/30/19 202 lb 6 oz (91.8 kg)  02/10/19 205 lb 3.2 oz (93.1 kg)    Physical Exam Vitals and nursing note reviewed.  Constitutional:      General: He is not in acute distress.    Appearance: He is well-developed. He is not diaphoretic.  Eyes:     General: No scleral icterus.    Conjunctiva/sclera: Conjunctivae normal.  Neck:     Thyroid: No thyromegaly.  Cardiovascular:     Rate and Rhythm: Normal rate and regular rhythm.     Heart sounds: Normal heart sounds. No murmur heard.   Pulmonary:       Effort: Pulmonary effort is normal. No respiratory distress.     Breath sounds: Normal breath sounds. No wheezing.  Musculoskeletal:        General: Normal range of motion.  Skin:    General: Skin is warm and dry.     Findings: No rash.  Neurological:     Mental Status: He is alert and oriented to person, place, and time.     Coordination: Coordination normal.  Psychiatric:        Behavior: Behavior normal.      Assessment & Plan:   Problem List Items Addressed This Visit      Digestive   GERD   Relevant Orders   CBC with Differential/Platelet (Completed)     Endocrine   Type 2 diabetes mellitus with other specified complication ([place]) - Primary   Relevant Orders   CMP14+EGFR (Completed)   Lipid panel (Completed)   Bayer DCA Hb A1c Waived (Completed)     Genitourinary   BPH (benign prostatic hyperplasia)     Other   Elevated prostate specific antigen (PSA)    Other Visit Diagnoses    History of PCR DNA positive for HSV1       Relevant Orders   STD Screen (8) (Completed)      A1c pending, will check other blood work.  No change in medication Follow up plan: Return in about 3 months (around 03/02/2020), or if symptoms worsen or fail to improve, for Diabetes and BPH and prostate.  Counseling provided for all of the vaccine components Orders Placed This Encounter  Procedures   Bayer [place] Hb A1c [place] Tiffony Kite, MD [facility] 12/01/2019, 3:29 PM

## 2019-12-02 NOTE — Telephone Encounter (Signed)
[facility]  received a request from your provider for coverage of Tadalafil 5mg . The request was denied because: Your plan approved criteria does not cover this drug when you use nitrate drugs either daily or on occasion, or if you use a guanylate cyclase stimulator. Your request has been denied based on the information we have.

## 2019-12-03 ENCOUNTER — Other Ambulatory Visit: Payer: Self-pay

## 2019-12-03 ENCOUNTER — Other Ambulatory Visit: Payer: BC Managed Care – PPO

## 2019-12-03 DIAGNOSIS — K219 Gastro-esophageal reflux disease without esophagitis: Secondary | ICD-10-CM

## 2019-12-03 DIAGNOSIS — Z8619 Personal history of other infectious and parasitic diseases: Secondary | ICD-10-CM

## 2019-12-03 DIAGNOSIS — E1169 Type 2 diabetes mellitus with other specified complication: Secondary | ICD-10-CM

## 2019-12-03 LAB — BAYER DCA HB A1C WAIVED: HB A1C (BAYER DCA - WAIVED): 5.4 % (ref <7.0)

## 2019-12-03 NOTE — Telephone Encounter (Signed)
I do not know why they would do this because according to my records he has not had a refill for nitroglycerin since 2017 and does not use the nitroglycerin at all and has not used it at all

## 2019-12-05 LAB — CBC WITH DIFFERENTIAL/PLATELET
Basophils Absolute: 0.1 10*3/uL (ref 0.0–0.2)
Basos: 1 %
EOS (ABSOLUTE): 0 10*3/uL (ref 0.0–0.4)
Eos: 1 %
Hematocrit: 49.6 % (ref 37.5–51.0)
Hemoglobin: 16.7 g/dL (ref 13.0–17.7)
Immature Grans (Abs): 0 10*3/uL (ref 0.0–0.1)
Immature Granulocytes: 0 %
Lymphocytes Absolute: 2 10*3/uL (ref 0.7–3.1)
Lymphs: 32 %
MCH: 27.7 pg (ref 26.6–33.0)
MCHC: 33.7 g/dL (ref 31.5–35.7)
MCV: 82 fL (ref 79–97)
Monocytes Absolute: 0.6 10*3/uL (ref 0.1–0.9)
Monocytes: 10 %
Neutrophils Absolute: 3.6 10*3/uL (ref 1.4–7.0)
Neutrophils: 56 %
Platelets: 194 10*3/uL (ref 150–450)
RBC: 6.03 x10E6/uL — ABNORMAL HIGH (ref 4.14–5.80)
RDW: 13.2 % (ref 11.6–15.4)
WBC: 6.3 10*3/uL (ref 3.4–10.8)

## 2019-12-05 LAB — CMP14+EGFR
ALT: 23 IU/L (ref 0–44)
AST: 19 IU/L (ref 0–40)
Albumin/Globulin Ratio: 1.9 (ref 1.2–2.2)
Albumin: 4.6 g/dL (ref 3.8–4.9)
Alkaline Phosphatase: 100 IU/L (ref 44–121)
BUN/Creatinine Ratio: 10 (ref 10–24)
BUN: 11 mg/dL (ref 8–27)
Bilirubin Total: 0.6 mg/dL (ref 0.0–1.2)
CO2: 23 mmol/L (ref 20–29)
Calcium: 9.6 mg/dL (ref 8.6–10.2)
Chloride: 103 mmol/L (ref 96–106)
Creatinine, Ser: 1.07 mg/dL (ref 0.76–1.27)
GFR calc Af Amer: 87 mL/min/{1.73_m2} (ref >59)
GFR calc non Af Amer: 75 mL/min/{1.73_m2} (ref >59)
Globulin, Total: 2.4 g/dL (ref 1.5–4.5)
Glucose: 100 mg/dL — ABNORMAL HIGH (ref 65–99)
Potassium: 4.6 mmol/L (ref 3.5–5.2)
Sodium: 139 mmol/L (ref 134–144)
Total Protein: 7 g/dL (ref 6.0–8.5)

## 2019-12-05 LAB — STD SCREEN (8)
HIV Screen 4th Generation wRfx: NONREACTIVE
HSV 1 Glycoprotein G Ab, IgG: 1.2 index — ABNORMAL HIGH (ref 0.00–0.90)
HSV 2 IgG, Type Spec: <0.91 index (ref 0.00–0.90)
Hep A IgM: NEGATIVE
Hep B C IgM: NEGATIVE
Hep C Virus Ab: <0.1 s/co ratio (ref 0.0–0.9)
Hepatitis B Surface Ag: NEGATIVE
RPR Ser Ql: NONREACTIVE

## 2019-12-05 LAB — LIPID PANEL
Chol/HDL Ratio: 4.1 ratio (ref 0.0–5.0)
Cholesterol, Total: 162 mg/dL (ref 100–199)
HDL: 40 mg/dL (ref >39)
LDL Chol Calc (NIH): 93 mg/dL (ref 0–99)
Triglycerides: 166 mg/dL — ABNORMAL HIGH (ref 0–149)
VLDL Cholesterol Cal: 29 mg/dL (ref 5–40)

## 2019-12-08 MED ORDER — TADALAFIL 5 MG PO TABS: 5.0000 mg | ORAL_TABLET | Freq: Every day | ORAL | 3 refills | Status: DC | PRN

## 2019-12-08 NOTE — Telephone Encounter (Signed)
PA submitted again for patient Key: [id number]

## 2019-12-08 NOTE — Telephone Encounter (Signed)
I have resent the Cialis generic for daily for BPH Caryl Pina, MD [facility] 12/08/2019, 3:32 PM

## 2019-12-08 NOTE — Telephone Encounter (Signed)
[facility]  received a request from your provider for coverage of Tadalafil 5mg . The request was denied because: Your plan covers this drug when you are 60 years of age or older and you have benign prostatic hyperplasia (BPH). Your request has been denied based on the information we have.  Patient has to be taking Tadalafil on a daily basis to treat BPH not to help with Erectile dysfunction.

## 2019-12-08 NOTE — Addendum Note (Signed)
Addended by: Caryl Pina on: 12/08/2019 03:33 PM   Modules accepted: Orders

## 2019-12-11 ENCOUNTER — Ambulatory Visit: Payer: BC Managed Care – PPO | Attending: Internal Medicine

## 2019-12-11 DIAGNOSIS — Z23 Encounter for immunization: Secondary | ICD-10-CM

## 2019-12-11 NOTE — Telephone Encounter (Signed)
Patients insurance will not pay for tadalafil. Patient will have to pay out of pocket for this medication.

## 2019-12-11 NOTE — Progress Notes (Signed)
   Covid-19 Vaccination Clinic  Name:  Jimmy Mathis    MRN: [id number] DOB: 20-Oct-1960  12/11/2019  [name] was observed post Covid-19 immunization for 15 minutes without incident. He was provided with Vaccine Information Sheet and instruction to access the V-Safe system.   [name] was instructed to call 911 with any severe reactions post vaccine: Marland Kitchen Difficulty breathing  . Swelling of face and throat  . A fast heartbeat  . A bad rash all over body  . Dizziness and weakness   Immunizations Administered    Name Date Dose VIS Date Route   Pfizer COVID-19 Vaccine 12/11/2019  3:40 PM 0.3 mL 10/29/2019 Intramuscular   Manufacturer: [place]   Lot: [id number]   [place]: [address]-3

## 2020-01-20 ENCOUNTER — Ambulatory Visit: Payer: BC Managed Care – PPO | Admitting: Family

## 2020-01-20 ENCOUNTER — Ambulatory Visit (INDEPENDENT_AMBULATORY_CARE_PROVIDER_SITE_OTHER): Payer: BC Managed Care – PPO

## 2020-01-20 ENCOUNTER — Other Ambulatory Visit: Payer: Self-pay

## 2020-01-20 ENCOUNTER — Encounter: Payer: Self-pay | Admitting: Family

## 2020-01-20 VITALS — BP 120/67 | HR 80 | Temp 98.5°F | Ht 69.0 in | Wt 223.0 lb

## 2020-01-20 DIAGNOSIS — T17908A Unspecified foreign body in respiratory tract, part unspecified causing other injury, initial encounter: Secondary | ICD-10-CM

## 2020-01-20 DIAGNOSIS — M546 Pain in thoracic spine: Secondary | ICD-10-CM

## 2020-01-20 NOTE — Patient Instructions (Signed)
Aspiration Pneumonia, Adult  Aspiration pneumonia is an infection that occurs after lung (pulmonary) aspiration. Pulmonary aspiration is when you inhale a large amount of food, liquid, stomach acid, or saliva into the lungs. This can cause inflammation and infection in the lungs. This can make you cough and make it hard to breathe. Aspiration pneumonia is a serious condition and can be life-threatening. What are the causes? This condition may be caused by:  Bacteria in food, liquid, stomach acid, or saliva that is inhaled into the lung.  Irritation and inflammation that results from material, such as blood or a foreign body, being inhaled into the lung. This can lead to an infection even though the material is not originally contaminated with bacteria. What increases the risk? You are more likely to get aspiration pneumonia if you have a condition that makes it hard to breathe, swallow, cough, or gag. These conditions may include:  A breathing disorder, such as chronic obstructive pulmonary disease, that makes it hard to eat or drink while breathing.  A brain (neurologic) disorder, such as stroke, seizures, Parkinson's disease, dementia, amyotrophic lateral sclerosis (ALS), or brain injury.  Having gastroesophageal reflux disease (GERD).  Having a weak disease-fighting system (immune system).  Having a narrowing of the tube that carries food to the stomach (esophageal narrowing). Other factors that may make you more likely to get aspiration pneumonia include:  Being older than age 60 and frail.  Being given a general anesthetic for procedures.  Drinking too much alcohol and passing out. If you pass out and vomit, then vomit can be inhaled into your lungs.  Taking certain medicines, such as tranquilizers or sedatives.  Taking poor care of your mouth and teeth.  Being malnourished. What are the signs or symptoms? The main symptom of pulmonary aspiration may be an episode of choking  or coughing while eating or drinking. When aspiration pneumonia develops, symptoms include:  Persistent cough.  Difficulty breathing, such as wheezing or shortness of breath.  Fever.  Chest pain.  Being more tired than usual (fatigue). Pulmonary aspiration may be silent, meaning that it is not associated with coughing or choking while eating or drinking. How is this diagnosed? This condition may be diagnosed based on:  A physical exam.  Tests, such as: ? A chest X-ray. ? A sputum culture. Saliva and mucus (sputum) are collected from the lungs or the tubes that carry air to the lungs (bronchi). The sputum is then tested for bacteria. ? Oximetry. A sensor or clip is placed on areas such as a finger, earlobe, or toe to measure the oxygen level in your blood. ? Blood tests. ? A swallowing study. This test looks at how food is swallowed and whether it goes into your windpipe (trachea) or esophagus. ? A bronchoscopy. This test uses a flexible tube (bronchoscope) to see inside the lungs. How is this treated? This condition may be treated with:  Medicines. Antibiotic medicine will be given to kill the pneumonia bacteria. Other medicines may also be used to reduce fever, pain, or inflammation.  Breathing assistance and oxygen therapy. Depending on how well you are breathing, you may need to be given oxygen, or you may need breathing support from a breathing machine (ventilator).  Thoracentesis. This is a procedure to remove fluid that has built up in the space between the linings of the chest wall and the lungs.  Dietary changes. You may need to avoid certain food textures or liquids. For people who have recurrent aspiration pneumonia,   a feeding tube might be placed in the stomach for nutrition. Follow these instructions at home: Medicines  Take over-the-counter and prescription medicines only as told by your health care provider.  If you were prescribed an antibiotic medicine, take  it as told by your health care provider. Do not stop taking the antibiotic even if you start to feel better.  Take cough medicine only if you are losing sleep. Cough medicine can prevent your body's natural ability to remove mucus from your lungs.   General instructions  Carefully follow any eating instructions you were given, such as avoiding certain food textures or thickening your liquids. Thickening liquids reduces the risk of developing aspiration pneumonia again.  Return to normal activities as told by your health care provider. Ask your health care provider what activities are safe for you.  Sleep in a semi-upright position at night. Try to sleep in a reclining chair, or place a few pillows under your head in bed.  Do not use any products that contain nicotine or tobacco, such as cigarettes, e-cigarettes, and chewing tobacco. If you need help quitting, ask your health care provider.  Keep all follow-up visits as told by your health care provider. This is important.   Contact a health care provider if you:  Have a fever.  Are coughing or choking while eating or drinking.  Continue to have signs or symptoms of aspiration pneumonia. Get help right away if you have:  Worsening shortness of breath, wheezing, or difficulty breathing.  Chest pain. Summary  Aspiration pneumonia is an infection that occurs after lung (pulmonary) aspiration. Pulmonary aspiration is when you inhale a large amount of food, liquid, stomach acid, or saliva into the lungs.  The main symptom of pulmonary aspiration may be an episode of choking or coughing. It may also be silent without coughing or choking.  You are more likely to get aspiration pneumonia if you have a condition that makes it hard to breathe, swallow, cough, or gag. This information is not intended to replace advice given to you by your health care provider. Make sure you discuss any questions you have with your health care provider. Document  Revised: 12/27/2018 Document Reviewed: 12/27/2018 Elsevier Patient Education  2021 [employer] Inc.  

## 2020-01-20 NOTE — Progress Notes (Signed)
Subjective:    Patient ID: Jimmy Mathis, male    DOB: [birth year], 60 y.o.   MRN: [id number]  Chief Complaint  Patient presents with  . Back Pain    Jimmy Mathis thinks hash brown was aspirated, patient would like chest xray   . Shortness of Breath  . Asthma   Pt presents to the office today with upper back pain. He states he aspirated on a hash brown a couple of weeks ago and is worried about pneumonia.  Shortness of Breath The problem occurs intermittently. Associated symptoms include sputum production (in AM and PM). Pertinent negatives include no claudication, ear pain, fever, headaches, leg swelling, neck pain, rhinorrhea, sore throat, vomiting or wheezing. Associated symptoms comments: Congestion in AM and PM. His past medical history is significant for pneumonia.      Review of Systems  Constitutional: Negative for fever.  HENT: Negative for ear pain, rhinorrhea and sore throat.   Respiratory: Positive for sputum production (in AM and PM) and shortness of breath. Negative for wheezing.   Cardiovascular: Negative for claudication and leg swelling.  Gastrointestinal: Negative for vomiting.  Musculoskeletal: Negative for neck pain.  Neurological: Negative for headaches.  All other systems reviewed and are negative.      Objective:   Physical Exam Vitals reviewed.  Constitutional:      General: He is not in acute distress.    Appearance: He is well-developed and well-nourished.  HENT:     Head: Normocephalic.     Right Ear: External ear normal.     Left Ear: External ear normal.     Mouth/Throat:     Mouth: Oropharynx is clear and moist.  Eyes:     General:        Right eye: No discharge.        Left eye: No discharge.     Pupils: Pupils are equal, round, and reactive to light.  Neck:     Thyroid: No thyromegaly.  Cardiovascular:     Rate and Rhythm: Normal rate and regular rhythm.     Pulses: Intact distal pulses.     Heart sounds: Normal heart sounds. No murmur  heard.   Pulmonary:     Effort: Pulmonary effort is normal. No respiratory distress.     Breath sounds: Normal breath sounds. No wheezing.  Abdominal:     General: Bowel sounds are normal. There is no distension.     Palpations: Abdomen is soft.     Tenderness: There is no abdominal tenderness.  Musculoskeletal:        General: No tenderness or edema. Normal range of motion.     Cervical back: Normal range of motion and neck supple.  Skin:    General: Skin is warm and dry.     Findings: No erythema or rash.  Neurological:     Mental Status: He is alert and oriented to person, place, and time.     Cranial Nerves: No cranial nerve deficit.     Deep Tendon Reflexes: Reflexes are normal and symmetric.  Psychiatric:        Mood and Affect: Mood and affect normal.        Behavior: Behavior normal.        Thought Content: Thought content normal.        Judgment: Judgment normal.     BP 120/67   Pulse 80   Temp 98.5 F (36.9 C) (Temporal)   Ht 5\' 9"  (1.753 m)   Wt  223 lb (101.2 kg)   SpO2 98%   BMI 32.93 kg/m      Assessment & Plan:  Jimmy Mathis comes in today with chief complaint of Back Pain Jimmy Mathis thinks hash brown was aspirated, patient would like chest xray ), Shortness of Breath, and Asthma   Diagnosis and orders addressed:  1. Aspiration into airway, initial encounter - CBC with Differential/Platelet - DG Chest 2 View; Future  2. Acute thoracic back pain, unspecified back pain laterality - CBC with Differential/Platelet - DG Chest 2 View; Future   X-ray pending CBC pending If negative will assume it is a back strain Exam is normal, no crackles heard  Evelina Dun, FNP

## 2020-01-21 LAB — CBC WITH DIFFERENTIAL/PLATELET
Basophils Absolute: 0 10*3/uL (ref 0.0–0.2)
Basos: 1 %
EOS (ABSOLUTE): 0.1 10*3/uL (ref 0.0–0.4)
Eos: 1 %
Hematocrit: 47.3 % (ref 37.5–51.0)
Hemoglobin: 16 g/dL (ref 13.0–17.7)
Immature Grans (Abs): 0 10*3/uL (ref 0.0–0.1)
Immature Granulocytes: 0 %
Lymphocytes Absolute: 2.2 10*3/uL (ref 0.7–3.1)
Lymphs: 33 %
MCH: 27.5 pg (ref 26.6–33.0)
MCHC: 33.8 g/dL (ref 31.5–35.7)
MCV: 81 fL (ref 79–97)
Monocytes Absolute: 0.4 10*3/uL (ref 0.1–0.9)
Monocytes: 7 %
Neutrophils Absolute: 3.9 10*3/uL (ref 1.4–7.0)
Neutrophils: 58 %
Platelets: 196 10*3/uL (ref 150–450)
RBC: 5.82 x10E6/uL — ABNORMAL HIGH (ref 4.14–5.80)
RDW: 14.1 % (ref 11.6–15.4)
WBC: 6.6 10*3/uL (ref 3.4–10.8)

## 2020-02-05 ENCOUNTER — Encounter: Payer: Self-pay | Admitting: Family Medicine

## 2020-02-05 MED ORDER — METFORMIN HCL 500 MG PO TABS: 500.0000 mg | ORAL_TABLET | Freq: Every day | ORAL | 0 refills | Status: DC

## 2020-03-04 ENCOUNTER — Encounter: Payer: Self-pay | Admitting: Family Medicine

## 2020-03-04 ENCOUNTER — Other Ambulatory Visit: Payer: Self-pay

## 2020-03-04 ENCOUNTER — Ambulatory Visit: Payer: BC Managed Care – PPO | Admitting: Family Medicine

## 2020-03-04 VITALS — BP 129/60 | HR 94 | Ht 69.0 in | Wt 227.0 lb

## 2020-03-04 DIAGNOSIS — E1169 Type 2 diabetes mellitus with other specified complication: Secondary | ICD-10-CM | POA: Diagnosis not present

## 2020-03-04 DIAGNOSIS — K219 Gastro-esophageal reflux disease without esophagitis: Secondary | ICD-10-CM | POA: Diagnosis not present

## 2020-03-04 LAB — BAYER DCA HB A1C WAIVED: HB A1C (BAYER DCA - WAIVED): 5.7 % (ref <7.0)

## 2020-03-04 NOTE — Progress Notes (Signed)
BP 129/60   Pulse 94   Ht 5\' 9"  (1.753 m)   Wt 227 lb (103 kg)   SpO2 99%   BMI 33.52 kg/m    Subjective:   Patient ID: Jimmy Mathis, male    DOB: Aug 16, 1960, 60 y.o.   MRN: [id number]  HPI: Jimmy Mathis is a 60 y.o. male presenting on 03/04/2020 for Medical Management of Chronic Issues and Diabetes   HPI Type 2 diabetes mellitus Patient comes in today for recheck of his diabetes. Patient has been currently taking Metformin, A1c 5.7. Patient is not currently on an ACE inhibitor/ARB. Patient has not seen an ophthalmologist this year. Patient denies any issues with their feet. The symptom started onset as an adult hypertriglyceridemia and sleep apnea ARE RELATED TO DM   GERD Patient is currently on the medication currently.  She denies any major symptoms or abdominal pain or belching or burping. She denies any blood in her stool or lightheadedness or dizziness.   Relevant past medical, surgical, family and social history reviewed and updated as indicated. Interim medical history since our last visit reviewed. Allergies and medications reviewed and updated.  Review of Systems  Constitutional: Positive for activity change and unexpected weight change. Negative for chills and fever.  Respiratory: Negative for shortness of breath and wheezing.   Cardiovascular: Negative for chest pain and leg swelling.  Musculoskeletal: Negative for back pain and gait problem.  Skin: Negative for rash.  All other systems reviewed and are negative.   Per HPI unless specifically indicated above   Allergies as of 03/04/2020      Reactions   Penicillins Nausea And Vomiting, Swelling   High fever Has patient had a PCN reaction causing immediate rash, facial/tongue/throat swelling, SOB or lightheadedness with hypotension: No Has patient had a PCN reaction causing severe rash involving mucus membranes or skin necrosis: No Has patient had a PCN reaction that required hospitalization: No Has  patient had a PCN reaction occurring within the last 10 years: No If all of the above answers are "NO", then may proceed with Cephalosporin use.   Procaine Hcl Swelling   Novocaine   Levaquin [levofloxacin] Other (See Comments)   Caused pain in tendons   Minocycline Other (See Comments)   Severe headache   Tetracyclines & Related    Headache      Medication List       Accurate as of March 04, 2020  3:26 PM. If you have any questions, ask your nurse or doctor.        calcium carbonate 500 MG chewable tablet Commonly known as: TUMS - dosed in mg elemental calcium Chew 1-2 tablets by mouth daily as needed for indigestion or heartburn.   metFORMIN 500 MG tablet Commonly known as: GLUCOPHAGE Take 1 tablet (500 mg total) by mouth daily with breakfast.   tadalafil 5 MG tablet Commonly known as: CIALIS Take 1 tablet (5 mg total) by mouth daily as needed for erectile dysfunction. What changed: when to take this   Vitamin D 50 MCG (2000 UT) Caps Take 2,000 Units by mouth daily.        Objective:   BP 129/60   Pulse 94   Ht 5\' 9"  (1.753 m)   Wt 227 lb (103 kg)   SpO2 99%   BMI 33.52 kg/m   Wt Readings from Last 3 Encounters:  03/04/20 227 lb (103 kg)  01/20/20 223 lb (101.2 kg)  12/01/19 212 lb (96.2 kg)  Physical Exam Vitals and nursing note reviewed.  Constitutional:      General: He is not in acute distress.    Appearance: He is well-developed and well-nourished. He is not diaphoretic.  Eyes:     General: No scleral icterus.    Extraocular Movements: EOM normal.     Conjunctiva/sclera: Conjunctivae normal.  Neck:     Thyroid: No thyromegaly.  Cardiovascular:     Rate and Rhythm: Normal rate and regular rhythm.     Pulses: Intact distal pulses.     Heart sounds: Normal heart sounds. No murmur heard.   Pulmonary:     Effort: Pulmonary effort is normal. No respiratory distress.     Breath sounds: Normal breath sounds. No wheezing.  Musculoskeletal:         General: No edema. Normal range of motion.     Cervical back: Neck supple.  Lymphadenopathy:     Cervical: No cervical adenopathy.  Skin:    General: Skin is warm and dry.     Findings: No rash.  Neurological:     Mental Status: He is alert and oriented to person, place, and time.     Coordination: Coordination normal.  Psychiatric:        Mood and Affect: Mood and affect normal.        Behavior: Behavior normal.     A1c is 5.7 today, will go ahead and stop metformin.  Assessment & Plan:   Problem List Items Addressed This Visit      Digestive   GERD     Endocrine   Type 2 diabetes mellitus with other specified complication ([place]) - Primary   Relevant Orders   Bayer DCA Hb A1c Waived      A1c 5.7, will stop Metformin follow-up in 3 months continue to focus on diet and exercise Follow up plan: Return in about 3 months (around 06/01/2020), or if symptoms worsen or fail to improve, for Diabetes recheck.  Counseling provided for all of the vaccine components Orders Placed This Encounter  Procedures  . [facility] Hb A1c Merna, MD [facility] 03/04/2020, 3:26 PM

## 2020-05-03 ENCOUNTER — Other Ambulatory Visit: Payer: BC Managed Care – PPO

## 2020-05-03 ENCOUNTER — Encounter: Payer: Self-pay | Admitting: Family Medicine

## 2020-05-03 ENCOUNTER — Other Ambulatory Visit: Payer: Self-pay | Admitting: Family Medicine

## 2020-05-03 ENCOUNTER — Ambulatory Visit (INDEPENDENT_AMBULATORY_CARE_PROVIDER_SITE_OTHER): Payer: BC Managed Care – PPO | Admitting: Family Medicine

## 2020-05-03 DIAGNOSIS — U071 COVID-19: Secondary | ICD-10-CM | POA: Diagnosis not present

## 2020-05-03 MED ORDER — PAXLOVID 20 X 150 MG & 10 X 100MG PO TBPK: 3.0000 | ORAL_TABLET | Freq: Two times a day (BID) | ORAL | 0 refills | Status: DC

## 2020-05-03 NOTE — Progress Notes (Signed)
Virtual Visit via telephone Note  I connected with Jimmy Mathis on 05/03/20 at 1400 by telephone and verified that I am speaking with the correct person using two identifiers. Jimmy Mathis is currently located at home and patient are currently with her during visit. The provider, Fransisca Kaufmann Moniqua Engebretsen, MD is located in their office at time of visit.  Call ended at 1418  I discussed the limitations, risks, security and privacy concerns of performing an evaluation and management service by telephone and the availability of in person appointments. I also discussed with the patient that there may be a patient responsible charge related to this service. The patient expressed understanding and agreed to proceed.   History and Present Illness: Patient started having symptoms 3 days ago.  He had home covid test that was negative but repeated it today and it was positive.  He has fever and joint aches and nasal congestion. He denies any shortness of breath or wheezing.  He had temp of 102 last night. He has diabetes as a risk factor.  He has been vaccinated and had one booster. He is a Education officer, museum.    No diagnosis found.  Outpatient Encounter Medications as of 05/03/2020  Medication Sig  . calcium carbonate (TUMS - DOSED IN MG ELEMENTAL CALCIUM) 500 MG chewable tablet Chew 1-2 tablets by mouth daily as needed for indigestion or heartburn.  . Cholecalciferol (VITAMIN D) 2000 units CAPS Take 2,000 Units by mouth daily.   . tadalafil (CIALIS) 5 MG tablet Take 1 tablet (5 mg total) by mouth daily as needed for erectile dysfunction. (Patient taking differently: Take 5 mg by mouth daily.)   No facility-administered encounter medications on file as of 05/03/2020.    Review of Systems  Constitutional: Positive for chills and fever.  HENT: Positive for congestion, postnasal drip and rhinorrhea. Negative for ear discharge, ear pain, sinus pressure, sneezing, sore throat and voice change.   Eyes:  Negative for pain, discharge, redness and visual disturbance.  Respiratory: Positive for cough. Negative for shortness of breath and wheezing.   Cardiovascular: Negative for chest pain and leg swelling.  Musculoskeletal: Positive for myalgias. Negative for gait problem.  Skin: Negative for rash.  All other systems reviewed and are negative.   Observations/Objective: Patient sounds comfortable and in no acute distress  Assessment and Plan: Problem List Items Addressed This Visit   None   Visit Diagnoses    COVID-19    -  Primary   Relevant Medications   Nirmatrelvir & Ritonavir (PAXLOVID) 20 x 150 MG & 10 x 100MG  TBPK      Recommended Mucinex and Flonase and over-the-counter sinus medicines.  Also sent Paxlovid Follow up plan: Return if symptoms worsen or fail to improve.     I discussed the assessment and treatment plan with the patient. The patient was provided an opportunity to ask questions and all were answered. The patient agreed with the plan and demonstrated an understanding of the instructions.   The patient was advised to call back or seek an in-person evaluation if the symptoms worsen or if the condition fails to improve as anticipated.  The above assessment and management plan was discussed with the patient. The patient verbalized understanding of and has agreed to the management plan. Patient is aware to call the clinic if symptoms persist or worsen. Patient is aware when to return to the clinic for a follow-up visit. Patient educated on when it is appropriate to go to the  emergency department.    I provided 18 minutes of non-face-to-face time during this encounter.    Worthy Rancher, MD

## 2020-06-03 ENCOUNTER — Encounter: Payer: Self-pay | Admitting: Family Medicine

## 2020-06-03 ENCOUNTER — Ambulatory Visit: Payer: BC Managed Care – PPO | Admitting: Family Medicine

## 2020-06-03 ENCOUNTER — Other Ambulatory Visit: Payer: Self-pay

## 2020-06-03 VITALS — BP 124/78 | HR 82 | Temp 98.2°F | Resp 20 | Ht 69.0 in | Wt 219.0 lb

## 2020-06-03 DIAGNOSIS — E1169 Type 2 diabetes mellitus with other specified complication: Secondary | ICD-10-CM

## 2020-06-03 DIAGNOSIS — R972 Elevated prostate specific antigen [PSA]: Secondary | ICD-10-CM

## 2020-06-03 DIAGNOSIS — N400 Enlarged prostate without lower urinary tract symptoms: Secondary | ICD-10-CM

## 2020-06-03 LAB — BAYER DCA HB A1C WAIVED: HB A1C (BAYER DCA - WAIVED): 5.7 % (ref <7.0)

## 2020-06-03 NOTE — Progress Notes (Signed)
BP 124/78   Pulse 82   Temp 98.2 F (36.8 C) (Temporal)   Resp 20   Ht _0  (1.753 m)   Wt 219 lb (99.3 kg)   SpO2 96%   BMI 32.34 kg/m    Subjective:   Patient ID: Jimmy Mathis, male    DOB: 1960-02-08, 60 y.o.   MRN: [id number]  HPI: Jimmy Mathis is a 60 y.o. male presenting on 06/03/2020 for Medical Management of Chronic Issues   HPI Type 2 diabetes mellitus Patient comes in today for recheck of his diabetes. Patient has been currently taking no medicine and has been doing well, A1c today 5.7. Patient is not currently on an ACE inhibitor/ARB. Patient has not seen an ophthalmologist this year. Patient denies any issues with their feet. The symptom started onset as an adult sleep apnea ARE RELATED TO DM   BPH Patient is coming in for recheck on BPH Symptoms: None currently Medication: Cialis as needed Last PSA: 12.5 on 02/10/2019  Relevant past medical, surgical, family and social history reviewed and updated as indicated. Interim medical history since our last visit reviewed. Allergies and medications reviewed and updated.  Review of Systems  Constitutional: Negative for chills and fever.  Eyes: Negative for discharge.  Respiratory: Negative for shortness of breath and wheezing.   Cardiovascular: Negative for chest pain and leg swelling.  Musculoskeletal: Negative for back pain and gait problem.  Skin: Negative for rash.  All other systems reviewed and are negative.   Per HPI unless specifically indicated above   Allergies as of 06/03/2020      Reactions   Penicillins Nausea And Vomiting, Swelling   High fever Has patient had a PCN reaction causing immediate rash, facial/tongue/throat swelling, SOB or lightheadedness with hypotension: No Has patient had a PCN reaction causing severe rash involving mucus membranes or skin necrosis: No Has patient had a PCN reaction that required hospitalization: No Has patient had a PCN reaction occurring within the last 10  years: No If all of the above answers are "NO", then may proceed with Cephalosporin use.   Procaine Hcl Swelling   Novocaine   Levaquin [levofloxacin] Other (See Comments)   Caused pain in tendons   Minocycline Other (See Comments)   Severe headache   Tetracyclines & Related    Headache      Medication List       Accurate as of Jun 03, 2020  3:35 PM. If you have any questions, ask your nurse or doctor.        STOP taking these medications   Paxlovid 20 x 150 MG & 10 x 100MG Tbpk Generic drug: Nirmatrelvir & Ritonavir Stopped by: Fransisca Kaufmann Delcia Spitzley, MD     TAKE these medications   calcium carbonate 500 MG chewable tablet Commonly known as: TUMS - dosed in mg elemental calcium Chew 1-2 tablets by mouth daily as needed for indigestion or heartburn.   tadalafil 5 MG tablet Commonly known as: CIALIS Take 1 tablet (5 mg total) by mouth daily as needed for erectile dysfunction. What changed: when to take this   Vitamin D 50 MCG (2000 UT) Caps Take 2,000 Units by mouth daily.        Objective:   BP 124/78   Pulse 82   Temp 98.2 F (36.8 C) (Temporal)   Resp 20   Ht _1  (1.753 m)   Wt 219 lb (99.3 kg)   SpO2 96%   BMI 32.34 kg/m  Wt Readings from Last 3 Encounters:  06/03/20 219 lb (99.3 kg)  03/04/20 227 lb (103 kg)  01/20/20 223 lb (101.2 kg)    Physical Exam Vitals and nursing note reviewed.  Constitutional:      General: He is not in acute distress.    Appearance: He is well-developed. He is not diaphoretic.  Eyes:     General: No scleral icterus.    Conjunctiva/sclera: Conjunctivae normal.  Neck:     Thyroid: No thyromegaly.  Cardiovascular:     Rate and Rhythm: Normal rate and regular rhythm.     Heart sounds: Normal heart sounds. No murmur heard.   Pulmonary:     Effort: Pulmonary effort is normal. No respiratory distress.     Breath sounds: Normal breath sounds. No wheezing.  Musculoskeletal:        General: Normal range of motion.      Cervical back: Neck supple.  Lymphadenopathy:     Cervical: No cervical adenopathy.  Skin:    General: Skin is warm and dry.     Findings: No rash.  Neurological:     Mental Status: He is alert and oriented to person, place, and time.     Coordination: Coordination normal.  Psychiatric:        Behavior: Behavior normal.     Results for orders placed or performed in visit on 03/04/20  Bayer DCA Hb A1c Waived  Result Value Ref Range   HB A1C (BAYER DCA - WAIVED) 5.7 <7.0 %    Assessment & Plan:   Problem List Items Addressed This Visit      Endocrine   Type 2 diabetes mellitus with other specified complication ([place]) - Primary   Relevant Orders   Bayer DCA Hb A1c Waived   CBC with Differential/Platelet   CMP14+EGFR   Lipid panel     Genitourinary   BPH (benign prostatic hyperplasia)     Other   Elevated prostate specific antigen (PSA)   Relevant Orders   PSA, total and free       Follow up plan: No follow-ups on file.  Counseling provided for all of the vaccine components Orders Placed This Encounter  Procedures  . Bayer DCA Hb A1c Waived  . CBC with Differential/Platelet  . CMP14+EGFR  . Lipid panel  . PSA, total and free    Caryl Pina, MD [facility] 06/03/2020, 3:35 PM

## 2020-06-05 LAB — CMP14+EGFR
ALT: 34 IU/L (ref 0–44)
AST: 24 IU/L (ref 0–40)
Albumin/Globulin Ratio: 2.1 (ref 1.2–2.2)
Albumin: 4.7 g/dL (ref 3.8–4.9)
Alkaline Phosphatase: 98 IU/L (ref 44–121)
BUN/Creatinine Ratio: 12 (ref 10–24)
BUN: 13 mg/dL (ref 8–27)
Bilirubin Total: 0.6 mg/dL (ref 0.0–1.2)
CO2: 21 mmol/L (ref 20–29)
Calcium: 9.9 mg/dL (ref 8.6–10.2)
Chloride: 101 mmol/L (ref 96–106)
Creatinine, Ser: 1.08 mg/dL (ref 0.76–1.27)
Globulin, Total: 2.2 g/dL (ref 1.5–4.5)
Glucose: 105 mg/dL — ABNORMAL HIGH (ref 65–99)
Potassium: 4.3 mmol/L (ref 3.5–5.2)
Sodium: 138 mmol/L (ref 134–144)
Total Protein: 6.9 g/dL (ref 6.0–8.5)
eGFR: 79 mL/min/{1.73_m2} (ref >59)

## 2020-06-05 LAB — LIPID PANEL
Chol/HDL Ratio: 4.4 ratio (ref 0.0–5.0)
Cholesterol, Total: 151 mg/dL (ref 100–199)
HDL: 34 mg/dL — ABNORMAL LOW (ref >39)
LDL Chol Calc (NIH): 68 mg/dL (ref 0–99)
Triglycerides: 308 mg/dL — ABNORMAL HIGH (ref 0–149)
VLDL Cholesterol Cal: 49 mg/dL — ABNORMAL HIGH (ref 5–40)

## 2020-06-05 LAB — CBC WITH DIFFERENTIAL/PLATELET
Basophils Absolute: 0.1 10*3/uL (ref 0.0–0.2)
Basos: 1 %
EOS (ABSOLUTE): 0.1 10*3/uL (ref 0.0–0.4)
Eos: 2 %
Hematocrit: 46.5 % (ref 37.5–51.0)
Hemoglobin: 15.6 g/dL (ref 13.0–17.7)
Immature Grans (Abs): 0 10*3/uL (ref 0.0–0.1)
Immature Granulocytes: 1 %
Lymphocytes Absolute: 2.4 10*3/uL (ref 0.7–3.1)
Lymphs: 33 %
MCH: 27.8 pg (ref 26.6–33.0)
MCHC: 33.5 g/dL (ref 31.5–35.7)
MCV: 83 fL (ref 79–97)
Monocytes Absolute: 0.6 10*3/uL (ref 0.1–0.9)
Monocytes: 9 %
Neutrophils Absolute: 4.1 10*3/uL (ref 1.4–7.0)
Neutrophils: 54 %
Platelets: 173 10*3/uL (ref 150–450)
RBC: 5.62 x10E6/uL (ref 4.14–5.80)
RDW: 14.2 % (ref 11.6–15.4)
WBC: 7.3 10*3/uL (ref 3.4–10.8)

## 2020-06-05 LAB — PSA, TOTAL AND FREE
PSA, Free Pct: 22.8 %
PSA, Free: 1.46 ng/mL
Prostate Specific Ag, Serum: 6.4 ng/mL — ABNORMAL HIGH (ref 0.0–4.0)

## 2020-08-31 ENCOUNTER — Encounter: Payer: Self-pay | Admitting: Family Medicine

## 2020-09-01 ENCOUNTER — Other Ambulatory Visit: Payer: Self-pay

## 2020-09-01 DIAGNOSIS — E1169 Type 2 diabetes mellitus with other specified complication: Secondary | ICD-10-CM

## 2020-09-01 DIAGNOSIS — Z114 Encounter for screening for human immunodeficiency virus [HIV]: Secondary | ICD-10-CM

## 2020-09-01 DIAGNOSIS — R972 Elevated prostate specific antigen [PSA]: Secondary | ICD-10-CM

## 2020-09-03 ENCOUNTER — Ambulatory Visit: Payer: BC Managed Care – PPO | Admitting: Family Medicine

## 2020-09-03 ENCOUNTER — Encounter: Payer: Self-pay | Admitting: Family Medicine

## 2020-09-03 ENCOUNTER — Other Ambulatory Visit: Payer: Self-pay

## 2020-09-03 VITALS — BP 128/83 | HR 55 | Temp 97.8°F | Resp 20 | Ht 69.0 in | Wt 217.0 lb

## 2020-09-03 DIAGNOSIS — Z114 Encounter for screening for human immunodeficiency virus [HIV]: Secondary | ICD-10-CM | POA: Diagnosis not present

## 2020-09-03 DIAGNOSIS — E1169 Type 2 diabetes mellitus with other specified complication: Secondary | ICD-10-CM | POA: Diagnosis not present

## 2020-09-03 DIAGNOSIS — R972 Elevated prostate specific antigen [PSA]: Secondary | ICD-10-CM | POA: Diagnosis not present

## 2020-09-03 DIAGNOSIS — N400 Enlarged prostate without lower urinary tract symptoms: Secondary | ICD-10-CM

## 2020-09-03 LAB — BAYER DCA HB A1C WAIVED: HB A1C (BAYER DCA - WAIVED): 5.7 % (ref <7.0)

## 2020-09-03 NOTE — Progress Notes (Signed)
BP 128/83   Pulse (!) 55   Temp 97.8 F (36.6 C) (Temporal)   Resp 20   Ht '5\' 9"'$  (1.753 m)   Wt 217 lb (98.4 kg)   SpO2 99%   BMI 32.05 kg/m    Subjective:   Patient ID: BUEL LEMM, male    DOB: [birth year], 60 y.o.   MRN: [id number]  HPI: ALIAS GRAHN is a 60 y.o. male presenting on 09/03/2020 for Medical Management of Chronic Issues   HPI Type 2 diabetes mellitus Patient comes in today for recheck of his diabetes. Patient has been currently taking no medication currently has been diet controlled, A1c pending. Patient is not currently on an ACE inhibitor/ARB. Patient has not seen an ophthalmologist this year. Patient denies any issues with their feet. The symptom started onset as an adult GERD ARE RELATED TO DM   BPH with elevated PSA Patient sees urology for this and has had biopsies in the past.  Relevant past medical, surgical, family and social history reviewed and updated as indicated. Interim medical history since our last visit reviewed. Allergies and medications reviewed and updated.  Review of Systems  Constitutional:  Negative for chills and fever.  Eyes:  Negative for visual disturbance.  Respiratory:  Negative for shortness of breath and wheezing.   Cardiovascular:  Negative for chest pain and leg swelling.  Musculoskeletal:  Negative for back pain and gait problem.  Skin:  Negative for rash.  Neurological:  Negative for dizziness, weakness and light-headedness.  All other systems reviewed and are negative.  Per HPI unless specifically indicated above   Allergies as of 09/03/2020       Reactions   Penicillins Nausea And Vomiting, Swelling   High fever Has patient had a PCN reaction causing immediate rash, facial/tongue/throat swelling, SOB or lightheadedness with hypotension: No Has patient had a PCN reaction causing severe rash involving mucus membranes or skin necrosis: No Has patient had a PCN reaction that required hospitalization: No Has  patient had a PCN reaction occurring within the last 10 years: No If all of the above answers are "NO", then may proceed with Cephalosporin use.   Procaine Hcl Swelling   Novocaine   Levaquin [levofloxacin] Other (See Comments)   Caused pain in tendons   Minocycline Other (See Comments)   Severe headache   Tetracyclines & Related    Headache        Medication List        Accurate as of September 03, 2020 10:12 AM. If you have any questions, ask your nurse or doctor.          calcium carbonate 500 MG chewable tablet Commonly known as: TUMS - dosed in mg elemental calcium Chew 1-2 tablets by mouth daily as needed for indigestion or heartburn.   tadalafil 5 MG tablet Commonly known as: CIALIS Take 1 tablet (5 mg total) by mouth daily as needed for erectile dysfunction. What changed: when to take this   Vitamin D 50 MCG (2000 UT) Caps Take 2,000 Units by mouth daily.         Objective:   BP 128/83   Pulse (!) 55   Temp 97.8 F (36.6 C) (Temporal)   Resp 20   Ht '5\' 9"'$  (1.753 m)   Wt 217 lb (98.4 kg)   SpO2 99%   BMI 32.05 kg/m   Wt Readings from Last 3 Encounters:  09/03/20 217 lb (98.4 kg)  06/03/20 219 lb (99.3 kg)  03/04/20 227 lb (103 kg)    Physical Exam Vitals and nursing note reviewed.  Constitutional:      General: He is not in acute distress.    Appearance: He is well-developed. He is not diaphoretic.  Eyes:     General: No scleral icterus.    Conjunctiva/sclera: Conjunctivae normal.  Neck:     Thyroid: No thyromegaly.  Cardiovascular:     Rate and Rhythm: Normal rate and regular rhythm.     Heart sounds: Normal heart sounds. No murmur heard. Pulmonary:     Effort: Pulmonary effort is normal. No respiratory distress.     Breath sounds: Normal breath sounds. No wheezing.  Musculoskeletal:        General: Normal range of motion.     Cervical back: Neck supple.  Lymphadenopathy:     Cervical: No cervical adenopathy.  Skin:    General: Skin  is warm and dry.     Findings: No rash.  Neurological:     Mental Status: He is alert and oriented to person, place, and time.     Coordination: Coordination normal.  Psychiatric:        Behavior: Behavior normal.      Assessment & Plan:   Problem List Items Addressed This Visit       Endocrine   Type 2 diabetes mellitus with other specified complication ([place]) - Primary   Relevant Orders   Microalbumin / creatinine urine ratio     Genitourinary   BPH (benign prostatic hyperplasia)     Other   Elevated prostate specific antigen (PSA)   Other Visit Diagnoses     Screening for HIV (human immunodeficiency virus)           A1c is pending, will see how it goes.  Per patient sounds like blood pressures are running well at home.  He does see urology as well. Follow up plan: No follow-ups on file.  Counseling provided for all of the vaccine components Orders Placed This Encounter  Procedures   Microalbumin / creatinine urine ratio    Caryl Pina, MD [facility] 09/03/2020, 10:12 AM

## 2020-09-04 LAB — CBC WITH DIFFERENTIAL/PLATELET
Basophils Absolute: 0.1 10*3/uL (ref 0.0–0.2)
Basos: 1 %
EOS (ABSOLUTE): 0.1 10*3/uL (ref 0.0–0.4)
Eos: 1 %
Hematocrit: 46.6 % (ref 37.5–51.0)
Hemoglobin: 15.8 g/dL (ref 13.0–17.7)
Immature Grans (Abs): 0 10*3/uL (ref 0.0–0.1)
Immature Granulocytes: 0 %
Lymphocytes Absolute: 2.1 10*3/uL (ref 0.7–3.1)
Lymphs: 39 %
MCH: 28 pg (ref 26.6–33.0)
MCHC: 33.9 g/dL (ref 31.5–35.7)
MCV: 83 fL (ref 79–97)
Monocytes Absolute: 0.5 10*3/uL (ref 0.1–0.9)
Monocytes: 10 %
Neutrophils Absolute: 2.6 10*3/uL (ref 1.4–7.0)
Neutrophils: 49 %
Platelets: 175 10*3/uL (ref 150–450)
RBC: 5.65 x10E6/uL (ref 4.14–5.80)
RDW: 14.1 % (ref 11.6–15.4)
WBC: 5.3 10*3/uL (ref 3.4–10.8)

## 2020-09-04 LAB — CMP14+EGFR
ALT: 31 IU/L (ref 0–44)
AST: 22 IU/L (ref 0–40)
Albumin/Globulin Ratio: 1.9 (ref 1.2–2.2)
Albumin: 4.5 g/dL (ref 3.8–4.8)
Alkaline Phosphatase: 94 IU/L (ref 44–121)
BUN/Creatinine Ratio: 12 (ref 10–24)
BUN: 13 mg/dL (ref 8–27)
Bilirubin Total: 0.6 mg/dL (ref 0.0–1.2)
CO2: 21 mmol/L (ref 20–29)
Calcium: 9.6 mg/dL (ref 8.6–10.2)
Chloride: 101 mmol/L (ref 96–106)
Creatinine, Ser: 1.1 mg/dL (ref 0.76–1.27)
Globulin, Total: 2.4 g/dL (ref 1.5–4.5)
Glucose: 105 mg/dL — ABNORMAL HIGH (ref 65–99)
Potassium: 5.1 mmol/L (ref 3.5–5.2)
Sodium: 138 mmol/L (ref 134–144)
Total Protein: 6.9 g/dL (ref 6.0–8.5)
eGFR: 76 mL/min/{1.73_m2} (ref >59)

## 2020-09-04 LAB — LIPID PANEL
Chol/HDL Ratio: 4 ratio (ref 0.0–5.0)
Cholesterol, Total: 145 mg/dL (ref 100–199)
HDL: 36 mg/dL — ABNORMAL LOW (ref >39)
LDL Chol Calc (NIH): 76 mg/dL (ref 0–99)
Triglycerides: 197 mg/dL — ABNORMAL HIGH (ref 0–149)
VLDL Cholesterol Cal: 33 mg/dL (ref 5–40)

## 2020-09-04 LAB — HIV ANTIBODY (ROUTINE TESTING W REFLEX): HIV Screen 4th Generation wRfx: NONREACTIVE

## 2020-09-04 LAB — PSA, TOTAL AND FREE
PSA, Free Pct: 22 %
PSA, Free: 1.56 ng/mL
Prostate Specific Ag, Serum: 7.1 ng/mL — ABNORMAL HIGH (ref 0.0–4.0)

## 2020-09-05 LAB — MICROALBUMIN / CREATININE URINE RATIO
Creatinine, Urine: 89.7 mg/dL
Microalb/Creat Ratio: <3 mg/g creat (ref 0–29)
Microalbumin, Urine: <3 ug/mL

## 2020-12-06 ENCOUNTER — Ambulatory Visit: Payer: BC Managed Care – PPO | Admitting: Family Medicine

## 2020-12-06 ENCOUNTER — Encounter: Payer: Self-pay | Admitting: Family Medicine

## 2020-12-06 VITALS — BP 116/64 | HR 75 | Ht 69.0 in | Wt 222.0 lb

## 2020-12-06 DIAGNOSIS — N529 Male erectile dysfunction, unspecified: Secondary | ICD-10-CM | POA: Diagnosis not present

## 2020-12-06 DIAGNOSIS — E1169 Type 2 diabetes mellitus with other specified complication: Secondary | ICD-10-CM | POA: Diagnosis not present

## 2020-12-06 DIAGNOSIS — N400 Enlarged prostate without lower urinary tract symptoms: Secondary | ICD-10-CM

## 2020-12-06 DIAGNOSIS — N486 Induration penis plastica: Secondary | ICD-10-CM | POA: Diagnosis not present

## 2020-12-06 DIAGNOSIS — K5900 Constipation, unspecified: Secondary | ICD-10-CM

## 2020-12-06 DIAGNOSIS — H8302 Labyrinthitis, left ear: Secondary | ICD-10-CM

## 2020-12-06 DIAGNOSIS — R42 Dizziness and giddiness: Secondary | ICD-10-CM

## 2020-12-06 LAB — BAYER DCA HB A1C WAIVED: HB A1C (BAYER DCA - WAIVED): 5.9 % — ABNORMAL HIGH (ref 4.8–5.6)

## 2020-12-06 MED ORDER — PREDNISONE 20 MG PO TABS
ORAL_TABLET | ORAL | 0 refills | Status: DC
Start: 2020-12-06 — End: 2021-01-11

## 2020-12-06 MED ORDER — TADALAFIL 5 MG PO TABS: 5.0000 mg | ORAL_TABLET | Freq: Every day | ORAL | 3 refills | Status: DC | PRN

## 2020-12-06 NOTE — Progress Notes (Signed)
BP 116/64   Pulse 75   Ht 5\' 9"  (1.753 m)   Wt 222 lb (100.7 kg)   SpO2 97%   BMI 32.78 kg/m    Subjective:   Patient ID: Jimmy Mathis, male    DOB: February 23, 1960, 60 y.o.   MRN: [id number]  HPI: Jimmy Mathis is a 60 y.o. male presenting on 12/06/2020 for Medical Management of Chronic Issues and Diabetes   HPI Type 2 diabetes mellitus Patient comes in today for recheck of his diabetes. Patient has been currently taking no medication and has been diet controlled, A1c today is 5.9. Patient is not currently on an ACE inhibitor/ARB. Patient has not seen an ophthalmologist this year. Patient denies any issues with their feet. The symptom started onset as an adult obesity ARE RELATED TO DM   Erectile dysfunction and Peyronie's disease Patient has erectile dysfunction Peyronie's disease and uses Cialis as needed but does work well for him.  Patient has had dizziness and spinning sensation, it started with a severe day 1 month ago and then the morning still since that month.  He says it is mainly in the morning and when he turns his head everything spins but it has been less severe than that first day but still happening most every morning.  He does describe it as a spinning sensation.  He says it initially occurred when he woke up and turned his head suddenly and then the whole room was spinning and that he was off balance most of that day and had some nausea.  He did have some cold and sinus symptoms going on at the same time.  Relevant past medical, surgical, family and social history reviewed and updated as indicated. Interim medical history since our last visit reviewed. Allergies and medications reviewed and updated.  Review of Systems  Constitutional:  Negative for chills and fever.  Eyes:  Negative for visual disturbance.  Respiratory:  Negative for shortness of breath and wheezing.   Cardiovascular:  Negative for chest pain and leg swelling.  Gastrointestinal:  Positive for  constipation.  Musculoskeletal:  Negative for back pain and gait problem.  Skin:  Negative for rash.  Neurological:  Positive for dizziness and light-headedness.  All other systems reviewed and are negative.  Per HPI unless specifically indicated above   Allergies as of 12/06/2020       Reactions   Penicillins Nausea And Vomiting, Swelling   High fever Has patient had a PCN reaction causing immediate rash, facial/tongue/throat swelling, SOB or lightheadedness with hypotension: No Has patient had a PCN reaction causing severe rash involving mucus membranes or skin necrosis: No Has patient had a PCN reaction that required hospitalization: No Has patient had a PCN reaction occurring within the last 10 years: No If all of the above answers are "NO", then may proceed with Cephalosporin use.   Procaine Hcl Swelling   Novocaine   Levaquin [levofloxacin] Other (See Comments)   Caused pain in tendons   Minocycline Other (See Comments)   Severe headache   Tetracyclines & Related    Headache        Medication List        Accurate as of December 06, 2020  4:40 PM. If you have any questions, ask your nurse or doctor.          calcium carbonate 500 MG chewable tablet Commonly known as: TUMS - dosed in mg elemental calcium Chew 1-2 tablets by mouth daily as needed for  indigestion or heartburn.   magnesium 30 MG tablet Take 30 mg by mouth 2 (two) times daily.   tadalafil 5 MG tablet Commonly known as: CIALIS Take 1 tablet (5 mg total) by mouth daily as needed for erectile dysfunction. What changed:  when to take this reasons to take this   Vitamin D 50 MCG (2000 UT) Caps Take 2,000 Units by mouth daily.         Objective:   BP 116/64   Pulse 75   Ht 5\' 9"  (1.753 m)   Wt 222 lb (100.7 kg)   SpO2 97%   BMI 32.78 kg/m   Wt Readings from Last 3 Encounters:  12/06/20 222 lb (100.7 kg)  09/03/20 217 lb (98.4 kg)  06/03/20 219 lb (99.3 kg)    Physical  Exam Vitals and nursing note reviewed.  Constitutional:      General: He is not in acute distress.    Appearance: He is well-developed. He is not diaphoretic.  Eyes:     General: No scleral icterus.    Conjunctiva/sclera: Conjunctivae normal.  Neck:     Thyroid: No thyromegaly.  Cardiovascular:     Rate and Rhythm: Normal rate and regular rhythm.     Heart sounds: Normal heart sounds. No murmur heard. Pulmonary:     Effort: Pulmonary effort is normal. No respiratory distress.     Breath sounds: Normal breath sounds. No wheezing.  Musculoskeletal:        General: No swelling. Normal range of motion.     Cervical back: Neck supple.  Lymphadenopathy:     Cervical: No cervical adenopathy.  Skin:    General: Skin is warm and dry.     Findings: No rash.  Neurological:     Mental Status: He is alert and oriented to person, place, and time.     Coordination: Coordination normal.  Psychiatric:        Behavior: Behavior normal.      Assessment & Plan:   Problem List Items Addressed This Visit       Endocrine   Type 2 diabetes mellitus with other specified complication ([place]) - Primary   Relevant Orders   Bayer DCA Hb A1c Waived (Completed)     Musculoskeletal and Integument   Peyronie's disease     Genitourinary   BPH (benign prostatic hyperplasia)   Relevant Medications   tadalafil (CIALIS) 5 MG tablet     Other   Erectile dysfunction   Other Visit Diagnoses     Vertigo       Labyrinthitis of left ear       Constipation, unspecified constipation type           Recommended MiraLAX every day for a week to alleviate constipation and to go back to his gastroenterologist.  Give short course of prednisone and handout for vertigo for possible dermatitis versus vertigo. Follow up plan: Return in about 3 months (around 03/08/2021), or if symptoms worsen or fail to improve, for Diabetes and erectile dysfunction.  Counseling provided for all of the vaccine  components Orders Placed This Encounter  Procedures   Bayer [place] Hb A1c [place] Jaxen Samples, MD [facility] 12/06/2020, 4:40 PM

## 2020-12-07 ENCOUNTER — Telehealth: Payer: Self-pay

## 2020-12-07 NOTE — Telephone Encounter (Signed)
(  Key: [id number])  Your information has been submitted to [place]. To check for an updated outcome later, reopen this PA request from your dashboard.  If [employer] has not responded to your request within 24 hours, contact [place] at [phone]. If you think there may be a problem with your PA request, use our live chat feature at the bottom right.  Dx. Erectile Dysfunction, BPH, Peyronie's Disease

## 2020-12-09 NOTE — Telephone Encounter (Signed)
Aware - he gets through urology

## 2020-12-09 NOTE — Telephone Encounter (Signed)
[facility] [employer]  received a prior authorization request from your provider for coverage of Tadalafil 5mg  for you. The [place] has approved criteria in place for coverage of this medication. We needed additional clinical information from your provider in order to make a decision to either approve or deny the request. We did not receive the additional clinical information within the time allowed to make the decision; therefore, the request is denied. The request is denied because: You do not meet the requirements of your plan. Your plan covers this drug when the patient has experienced an inadequate treatment response, intolerance, or contraindication to an alpha-blocker and/or a 5 alpha-reductase inhibitor (5-ARI) Your request has been denied based on the information we have.

## 2020-12-09 NOTE — Telephone Encounter (Signed)
Lm to call back - did not want to leave info on his VM

## 2020-12-09 NOTE — Telephone Encounter (Signed)
I would just let him know that it is not covered, no reason to appeal

## 2021-01-11 ENCOUNTER — Ambulatory Visit (INDEPENDENT_AMBULATORY_CARE_PROVIDER_SITE_OTHER): Payer: BC Managed Care – PPO

## 2021-01-11 ENCOUNTER — Encounter: Payer: Self-pay | Admitting: Family Medicine

## 2021-01-11 ENCOUNTER — Ambulatory Visit: Payer: BC Managed Care – PPO | Admitting: Family Medicine

## 2021-01-11 VITALS — BP 140/65 | HR 93 | Temp 98.3°F | Ht 69.0 in | Wt 226.8 lb

## 2021-01-11 DIAGNOSIS — R051 Acute cough: Secondary | ICD-10-CM

## 2021-01-11 DIAGNOSIS — B9689 Other specified bacterial agents as the cause of diseases classified elsewhere: Secondary | ICD-10-CM

## 2021-01-11 DIAGNOSIS — J988 Other specified respiratory disorders: Secondary | ICD-10-CM | POA: Diagnosis not present

## 2021-01-11 LAB — HM DIABETES EYE EXAM

## 2021-01-11 LAB — VERITOR FLU A/B WAIVED
Influenza A: NEGATIVE
Influenza B: NEGATIVE

## 2021-01-11 MED ORDER — AZITHROMYCIN 250 MG PO TABS
ORAL_TABLET | ORAL | 0 refills | Status: DC
Start: 2021-01-11 — End: 2021-01-31

## 2021-01-11 MED ORDER — BENZONATATE 100 MG PO CAPS: 100.0000 mg | ORAL_CAPSULE | Freq: Three times a day (TID) | ORAL | 0 refills | Status: DC | PRN

## 2021-01-11 NOTE — Progress Notes (Signed)
Assessment & Plan:  1. Bacterial respiratory infection - azithromycin (ZITHROMAX Z-PAK) 250 MG tablet; Take 2 tablets (500 mg) PO today, then 1 tablet (250 mg) PO daily x4 days.  Dispense: 6 tablet; Refill: 0  2. Acute cough - Veritor Flu A/B Waived (negative) - DG Chest 2 View - Novel Coronavirus, NAA ([facility]); Future   Follow up plan: Return if symptoms worsen or fail to improve.  Hendricks Limes, MSN, APRN, FNP-C [facility]  Subjective:   Patient ID: Jimmy Mathis, male    DOB: [birth year], 61 y.o.   MRN: [id number]  HPI: Jimmy Mathis is a 61 y.o. male presenting on 01/11/2021 for Cough and Laryngitis (Started 12/15 and got better but started getting worse again last few days. )  Patient complains of cough, chest congestion, runny nose, sneezing, wheezing, and body aches . Onset of symptoms was 3 weeks ago, initially improving, but then worsening since that time. He is drinking plenty of fluids. Evaluation to date: at home COVID test negative x3. Treatment to date:  Ibuprofen, Mucinex . He does not smoke. Patient has been fully vaccinated against COVID-19. Patient has a history of pneumonia and is requesting a chest x-ray today.    ROS: Negative unless specifically indicated above in HPI.   Relevant past medical history reviewed and updated as indicated.   Allergies and medications reviewed and updated.   Current Outpatient Medications:    calcium carbonate (TUMS - DOSED IN MG ELEMENTAL CALCIUM) 500 MG chewable tablet, Chew 1-2 tablets by mouth daily as needed for indigestion or heartburn., Disp: , Rfl:    Cholecalciferol (VITAMIN D) 2000 units CAPS, Take 2,000 Units by mouth daily. , Disp: , Rfl:    magnesium 30 MG tablet, Take 30 mg by mouth 2 (two) times daily., Disp: , Rfl:    tadalafil (CIALIS) 5 MG tablet, Take 1 tablet (5 mg total) by mouth daily as needed for erectile dysfunction., Disp: 90 tablet, Rfl: 3  Allergies  Allergen Reactions    Penicillins Nausea And Vomiting and Swelling    High fever Has patient had a PCN reaction causing immediate rash, facial/tongue/throat swelling, SOB or lightheadedness with hypotension: No Has patient had a PCN reaction causing severe rash involving mucus membranes or skin necrosis: No Has patient had a PCN reaction that required hospitalization: No Has patient had a PCN reaction occurring within the last 10 years: No If all of the above answers are "NO", then may proceed with Cephalosporin use.    Procaine Hcl Swelling    Novocaine   Levaquin [Levofloxacin] Other (See Comments)    Caused pain in tendons   Minocycline Other (See Comments)    Severe headache   Tetracyclines & Related     Headache     Objective:   BP 140/65    Pulse 93    Temp 98.3 F (36.8 C) (Temporal)    Ht 5\' 9"  (1.753 m)    Wt 226 lb 12.8 oz (102.9 kg)    SpO2 97%    BMI 33.49 kg/m    Physical Exam Vitals reviewed.  Constitutional:      General: He is not in acute distress.    Appearance: Normal appearance. He is not ill-appearing, toxic-appearing or diaphoretic.  HENT:     Head: Normocephalic and atraumatic.     Right Ear: Tympanic membrane, ear canal and external ear normal. There is no impacted cerumen.     Left Ear: Tympanic membrane, ear canal  and external ear normal. There is no impacted cerumen.     Nose: Nose normal.     Mouth/Throat:     Mouth: Mucous membranes are moist.     Pharynx: Oropharynx is clear. No oropharyngeal exudate or posterior oropharyngeal erythema.  Eyes:     General: No scleral icterus.       Right eye: No discharge.        Left eye: No discharge.     Conjunctiva/sclera: Conjunctivae normal.  Cardiovascular:     Rate and Rhythm: Normal rate and regular rhythm.     Heart sounds: Normal heart sounds. No murmur heard.   No friction rub. No gallop.  Pulmonary:     Effort: Pulmonary effort is normal. No respiratory distress.     Breath sounds: Normal breath sounds. No  stridor. No wheezing, rhonchi or rales.  Musculoskeletal:        General: Normal range of motion.     Cervical back: Normal range of motion.  Lymphadenopathy:     Cervical: No cervical adenopathy.  Skin:    General: Skin is warm and dry.  Neurological:     Mental Status: He is alert and oriented to person, place, and time. Mental status is at baseline.  Psychiatric:        Mood and Affect: Mood normal.        Behavior: Behavior normal.        Thought Content: Thought content normal.        Judgment: Judgment normal.

## 2021-01-12 LAB — SARS-COV-2, NAA 2 DAY TAT

## 2021-01-12 LAB — NOVEL CORONAVIRUS, NAA: SARS-CoV-2, NAA: NOT DETECTED

## 2021-01-24 ENCOUNTER — Emergency Department (HOSPITAL_COMMUNITY): Payer: BC Managed Care – PPO

## 2021-01-24 ENCOUNTER — Emergency Department (HOSPITAL_COMMUNITY)
Admission: EM | Admit: 2021-01-24 | Discharge: 2021-01-24 | Disposition: A | Discharge status: Home / Self Care | Payer: BC Managed Care – PPO | Attending: Emergency Medicine | Admitting: Emergency Medicine

## 2021-01-24 ENCOUNTER — Encounter (HOSPITAL_COMMUNITY): Payer: Self-pay

## 2021-01-24 ENCOUNTER — Other Ambulatory Visit: Payer: Self-pay

## 2021-01-24 DIAGNOSIS — U071 COVID-19: Secondary | ICD-10-CM

## 2021-01-24 DIAGNOSIS — R059 Cough, unspecified: Secondary | ICD-10-CM | POA: Diagnosis present

## 2021-01-24 DIAGNOSIS — E119 Type 2 diabetes mellitus without complications: Secondary | ICD-10-CM | POA: Diagnosis not present

## 2021-01-24 DIAGNOSIS — Z87891 Personal history of nicotine dependence: Secondary | ICD-10-CM | POA: Diagnosis not present

## 2021-01-24 DIAGNOSIS — R051 Acute cough: Secondary | ICD-10-CM

## 2021-01-24 LAB — RESP PANEL BY RT-PCR (FLU A&B, COVID) ARPGX2
Influenza A by PCR: NEGATIVE
Influenza B by PCR: NEGATIVE
SARS Coronavirus 2 by RT PCR: POSITIVE — AB

## 2021-01-24 MED ORDER — NIRMATRELVIR/RITONAVIR (PAXLOVID)TABLET
3.0000 | ORAL_TABLET | Freq: Two times a day (BID) | ORAL | Status: DC
  Administered 2021-01-24: 3 via ORAL
  Filled 2021-01-24: qty 30

## 2021-01-24 NOTE — ED Triage Notes (Signed)
2 weeks ago he was dx with pneumonia. It was getting better until Saturday when he started coughing again. 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 He took 3 total at home. 1 didn't work. 1 was negative. 1 was faintly positive. He would like to know if he has covid or not and would like a cxr to check on his pneumonia

## 2021-01-24 NOTE — ED Provider Notes (Signed)
[facility] Emergency Department Provider Note MRN:  [id number]  Arrival date & time: 01/24/21     Chief Complaint   Cough   History of Present Illness   Jimmy Mathis is a 61 y.o. year-old male with no pertinent past medical history presenting to the ED with chief complaint of cough.  Last month patient developed a URI and was later diagnosed with pneumonia.  Finished a course of azithromycin.  Has been feeling well up until about 24 to 48 hours ago when he began having persistent dry cough.  Also having dull frontal headaches.  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  Denies shortness of breath, no chest pain but does endorse chest congestion, no abdominal pain, no fever, no rash, no body aches.  Review of Systems  A thorough review of systems was obtained and all systems are negative except as noted in the HPI and PMH.   Patient's Health History    Past Medical History:  Diagnosis Date   Anxiety    Blood transfusion without reported diagnosis    Diabetes mellitus without complication ([place])    Sleep apnea     Past Surgical History:  Procedure Laterality Date   CHOLECYSTECTOMY     COLONOSCOPY     COLONOSCOPY N/A 09/27/2016   Procedure: COLONOSCOPY;  Surgeon: Daneil Dolin, MD;  Location: [facility] ENDO SUITE;  Service: Endoscopy;  Laterality: N/A;  830    POLYPECTOMY  09/27/2016   Procedure: POLYPECTOMY;  Surgeon: Daneil Dolin, MD;  Location: [facility] END[facility] SU[facility];  Service: Endoscopy;;  colon   TENDON REPAIR     right hand   TONSILLECTOMY      Family History  Problem Relation Age of Onset   Cancer Father        lung   Diabetes Father    Colon cancer Other    Colon cancer Paternal Uncle    Colon cancer Maternal Grandfather    Diabetes Maternal Grandmother     Social History   Socioeconomic History   Marital status: Single    Spouse name: Not on file   Number of children: Not on file    Years of education: Not on file   Highest education level: Not on file  Occupational History   Not on file  Tobacco Use   Smoking status: Former    Packs/day: 1.50    Years: 12.00    Pack years: 18.00    Types: Cigarettes   Smokeless tobacco: Never   Tobacco comments:    quit smoking at age 28  Vaping Use   Vaping Use: Never used  Substance and Sexual Activity   Alcohol use: Yes    Comment: occasionally   Drug use: No   Sexual activity: Not on file  Other Topics Concern   Not on file  Social History Narrative   Not on file   Social Determinants of Health   Financial Resource Strain: Not on file  Food Insecurity: Not on file  Transportation Needs: Not on file  Physical Activity: Not on file  Stress: Not on file  Social Connections: Not on file  Intimate Partner Violence: Not on file     Physical Exam   Vitals:   01/24/21 0523  BP: (!) 167/84  Pulse: (!) 107  Resp: 19  Temp: 98.8 F (37.1 C)  SpO2: 97%    CONSTITUTIONAL: Well-appearing, NAD NEURO/PSYCH:  Alert and oriented x  3, no focal deficits EYES:  eyes equal and reactive ENT/NECK:  no LAD, no JVD CARDIO: Regular rate, well-perfused, normal S1 and S2 PULM:  CTAB no wheezing or rhonchi GI/GU:  non-distended, non-tender MSK/SPINE:  No gross deformities, no edema SKIN:  no rash, atraumatic   *Additional and/or pertinent findings included in MDM below  Diagnostic and Interventional Summary    EKG Interpretation  Date/Time:    Ventricular Rate:    PR Interval:    QRS Duration:   QT Interval:    QTC Calculation:   R Axis:     Text Interpretation:         Labs Reviewed  RESP PANEL BY RT-PCR (FLU A&B, COVID) [id number]    DG Chest Portable 1 View  Final Result      Medications - No data to display   Procedures  /  Critical Care Procedures  ED Course and Medical Decision Making  Initial Impression and Ddx Suspect viral illness, likely COVID-19 given sick contacts.  Patient is sitting  comfortably in no acute distress, vital signs are normal, no hypoxia, lungs are clear.  Given the recent pneumonia will repeat x-ray, otherwise anticipating discharge.  Past medical/surgical history that increases complexity of ED encounter: Diabetes  Interpretation of Diagnostics I personally reviewed the Chest Xray and my interpretation is as follows: No pneumothorax, no obvious opacities      Patient Reassessment and Ultimate Disposition/Management Patient wondering whether or not he needs pink fluid and so we will wait on COVID/flu testing result.  Signed out to oncoming provider at shift change.  Patient management required discussion with the following services or consulting groups:  None  Complexity of Problems Addressed Acute complicated illness or Injury  Additional Data Reviewed and Analyzed Further history obtained from: None  Factors Impacting ED Encounter Risk None  Barth Kirks. Sedonia Small, [place] [email or web address]  Final Clinical Impressions(s) / ED Diagnoses     ICD-10-CM   1. Acute cough  R05.1       ED Discharge Orders     None        Discharge Instructions Discussed with and Provided to Patient:   Discharge Instructions   None      Maudie Flakes, MD 01/24/21 [phone]

## 2021-01-24 NOTE — Discharge Instructions (Signed)
Tylenol or Motrin for aches and fevers and follow-up as needed.  Drink plenty of fluids

## 2021-01-24 NOTE — ED Notes (Signed)
Called to check on swab that has been in lab since 0530.Marland Kitchenper orders lab, it read sample error and they are rerunning now.

## 2021-01-31 ENCOUNTER — Ambulatory Visit: Payer: BC Managed Care – PPO | Admitting: Nurse Practitioner

## 2021-01-31 ENCOUNTER — Ambulatory Visit (INDEPENDENT_AMBULATORY_CARE_PROVIDER_SITE_OTHER): Payer: BC Managed Care – PPO

## 2021-01-31 ENCOUNTER — Encounter: Payer: Self-pay | Admitting: Nurse Practitioner

## 2021-01-31 VITALS — BP 119/66 | HR 81 | Temp 98.2°F | Ht 69.0 in | Wt 218.0 lb

## 2021-01-31 DIAGNOSIS — R051 Acute cough: Secondary | ICD-10-CM

## 2021-01-31 MED ORDER — DM-GUAIFENESIN ER 30-600 MG PO TB12: 1.0000 | ORAL_TABLET | Freq: Two times a day (BID) | ORAL | 0 refills | Status: DC

## 2021-01-31 NOTE — Patient Instructions (Signed)

## 2021-01-31 NOTE — Progress Notes (Signed)
Acute Office Visit  Subjective:    Patient ID: Jimmy Mathis, male    DOB: [birth year], 61 y.o.   MRN: [id number]  Chief Complaint  Patient presents with   Cough    Cough This is a recurrent problem. The problem has been unchanged. The problem occurs constantly. The cough is Productive of sputum. Associated symptoms include chest pain. Pertinent negatives include no chills, ear congestion, ear pain, fever, headaches, nasal congestion or sore throat.   Past Medical History:  Diagnosis Date   Anxiety    Blood transfusion without reported diagnosis    Diabetes mellitus without complication ([place])    Sleep apnea     Past Surgical History:  Procedure Laterality Date   CHOLECYSTECTOMY     COLONOSCOPY     COLONOSCOPY N/A 09/27/2016   Procedure: COLONOSCOPY;  Surgeon: Daneil Dolin, MD;  Location: [facility];  Service: Endoscopy;  Laterality: N/A;  830    POLYPECTOMY  09/27/2016   Procedure: POLYPECTOMY;  Surgeon: Daneil Dolin, MD;  Location: [facility];  Service: Endoscopy;;  colon   TENDON REPAIR     right hand   TONSILLECTOMY      Family History  Problem Relation Age of Onset   Cancer Father        lung   Diabetes Father    Colon cancer Other    Colon cancer Paternal Uncle    Colon cancer Maternal Grandfather    Diabetes Maternal Grandmother     Social History   Socioeconomic History   Marital status: Single    Spouse name: Not on file   Number of children: Not on file   Years of education: Not on file   Highest education level: Not on file  Occupational History   Not on file  Tobacco Use   Smoking status: Former    Packs/day: 1.50    Years: 12.00    Pack years: 18.00    Types: Cigarettes   Smokeless tobacco: Never   Tobacco comments:    quit smoking at age 5  Vaping Use   Vaping Use: Never used  Substance and Sexual Activity   Alcohol use: Yes    Comment: occasionally   Drug use: No   Sexual activity: Not on file  Other Topics Concern    Not on file  Social History Narrative   Not on file   Social Determinants of Health   Financial Resource Strain: Not on file  Food Insecurity: Not on file  Transportation Needs: Not on file  Physical Activity: Not on file  Stress: Not on file  Social Connections: Not on file  Intimate Partner Violence: Not on file    Outpatient Medications Prior to Visit  Medication Sig Dispense Refill   calcium carbonate (TUMS - DOSED IN MG ELEMENTAL CALCIUM) 500 MG chewable tablet Chew 1-2 tablets by mouth daily as needed for indigestion or heartburn.     Cholecalciferol (VITAMIN D) 2000 units CAPS Take 2,000 Units by mouth daily.      magnesium 30 MG tablet Take 30 mg by mouth 2 (two) times daily.     benzonatate (TESSALON PERLES) 100 MG capsule Take 1 capsule (100 mg total) by mouth 3 (three) times daily as needed for cough. (Patient not taking: Reported on 01/31/2021) 30 capsule 0   tadalafil (CIALIS) 5 MG tablet Take 1 tablet (5 mg total) by mouth daily as needed for erectile dysfunction. (Patient not taking: Reported on 01/31/2021) 90 tablet  3   azithromycin (ZITHROMAX Z-PAK) 250 MG tablet Take 2 tablets (500 mg) PO today, then 1 tablet (250 mg) PO daily x4 days. 6 tablet 0   No facility-administered medications prior to visit.    Allergies  Allergen Reactions   Penicillins Nausea And Vomiting and Swelling    High fever Has patient had a PCN reaction causing immediate rash, facial/tongue/throat swelling, SOB or lightheadedness with hypotension: No Has patient had a PCN reaction causing severe rash involving mucus membranes or skin necrosis: No Has patient had a PCN reaction that required hospitalization: No Has patient had a PCN reaction occurring within the last 10 years: No If all of the above answers are "NO", then may proceed with Cephalosporin use.    Procaine Hcl Swelling    Novocaine   Levaquin [Levofloxacin] Other (See Comments)    Caused pain in tendons   Minocycline Other  (See Comments)    Severe headache   Tetracyclines & Related     Headache     Review of Systems  Constitutional:  Negative for chills and fever.  HENT:  Positive for congestion. Negative for ear pain, sinus pressure, sinus pain and sore throat.   Respiratory:  Positive for cough.   Cardiovascular:  Positive for chest pain. Negative for palpitations and leg swelling.  Gastrointestinal: Negative.  Negative for constipation.  Neurological:  Negative for headaches.      Objective:    Physical Exam Vitals and nursing note reviewed.  Constitutional:      Appearance: Normal appearance.  HENT:     Head: Normocephalic.     Right Ear: External ear normal.     Left Ear: External ear normal.     Nose: Congestion present.  Cardiovascular:     Rate and Rhythm: Normal rate and regular rhythm.     Pulses: Normal pulses.     Heart sounds: Normal heart sounds.  Pulmonary:     Effort: Pulmonary effort is normal.     Breath sounds: Normal breath sounds.  Abdominal:     General: Bowel sounds are normal.  Skin:    General: Skin is warm.     Findings: No rash.  Neurological:     Mental Status: He is alert and oriented to person, place, and time.  Psychiatric:        Behavior: Behavior normal.    BP 119/66    Pulse 81    Temp 98.2 F (36.8 C) (Temporal)    Ht _0  (1.753 m)    Wt 218 lb (98.9 kg)    SpO2 96%    BMI 32.19 kg/m  Wt Readings from Last 3 Encounters:  01/31/21 218 lb (98.9 kg)  01/24/21 226 lb 12.8 oz (102.9 kg)  01/11/21 226 lb 12.8 oz (102.9 kg)    There are no preventive care reminders to display for this patient.  There are no preventive care reminders to display for this patient.   Lab Results  Component Value Date   TSH 2.530 07/23/2017   Lab Results  Component Value Date   WBC 5.3 09/03/2020   HGB 15.8 09/03/2020   HCT 46.6 09/03/2020   MCV 83 09/03/2020   PLT 175 09/03/2020   Lab Results  Component Value Date   NA 138 09/03/2020   K 5.1 09/03/2020    CO2 21 09/03/2020   GLUCOSE 105 (H) 09/03/2020   BUN 13 09/03/2020   CREATININE 1.10 09/03/2020   BILITOT 0.6 09/03/2020   ALKPHOS 94  09/03/2020   AST 22 09/03/2020   ALT 31 09/03/2020   PROT 6.9 09/03/2020   ALBUMIN 4.5 09/03/2020   CALCIUM 9.6 09/03/2020   EGFR 76 09/03/2020   Lab Results  Component Value Date   CHOL 145 09/03/2020   Lab Results  Component Value Date   HDL 36 (L) 09/03/2020   Lab Results  Component Value Date   LDLCALC 76 09/03/2020   Lab Results  Component Value Date   TRIG 197 (H) 09/03/2020   Lab Results  Component Value Date   CHOLHDL 4.0 09/03/2020   Lab Results  Component Value Date   HGBA1C 5.9 (H) 12/06/2020       Assessment & Plan:  Patient presents with cough and congestion in the past 7 days.  Patient was positive for COVID-19 and was treated with antiviral from the emergency department.  Patient did not take medication as prescribed.  Today patient is requesting a chest x-ray because he feels he is got pneumonia.  I provided education to patient.  Patient reports that in the past he had pneumonia with symptoms.  Checks x-ray orders completed results pending.  Take meds as prescribed - Use a cool mist humidifier  -Use saline nose sprays frequently -Force fluids -For fever or aches or pains- take Tylenol or ibuprofen. -Guaifenesin for cough and congestion. -If symptoms do not improve, she may need to be COVID tested to rule this out Follow up with worsening unresolved symptoms   Problem List Items Addressed This Visit   None Visit Diagnoses     Acute cough    -  Primary   Relevant Medications   dextromethorphan-guaiFENesin (MUCINEX DM) 30-600 MG 12hr tablet   Other Relevant Orders   DG Chest 2 View        Meds ordered this encounter  Medications   dextromethorphan-guaiFENesin (MUCINEX DM) 30-600 MG 12hr tablet    Sig: Take 1 tablet by mouth 2 (two) times daily.    Dispense:  30 tablet    Refill:  0    Order  Specific Question:   Supervising Provider    Answer:   Claretta Fraise [[id number]]     Ivy Lynn, NP

## 2021-03-09 ENCOUNTER — Ambulatory Visit: Payer: BC Managed Care – PPO | Admitting: Family Medicine

## 2021-03-09 ENCOUNTER — Encounter: Payer: Self-pay | Admitting: Family Medicine

## 2021-03-09 VITALS — BP 123/71 | HR 69 | Ht 69.0 in | Wt 224.0 lb

## 2021-03-09 DIAGNOSIS — R198 Other specified symptoms and signs involving the digestive system and abdomen: Secondary | ICD-10-CM

## 2021-03-09 DIAGNOSIS — Z5320 Procedure and treatment not carried out because of patient's decision for unspecified reasons: Secondary | ICD-10-CM | POA: Diagnosis not present

## 2021-03-09 DIAGNOSIS — E1169 Type 2 diabetes mellitus with other specified complication: Secondary | ICD-10-CM | POA: Diagnosis not present

## 2021-03-09 NOTE — Progress Notes (Signed)
? ?BP 123/71   Pulse 69   Ht 5' 9" (1.753 m)   Wt 224 lb (101.6 kg)   SpO2 98%   BMI 33.08 kg/m?   ? ?Subjective:  ? ?Patient ID: Jimmy Mathis, male    DOB: November 20, 1960, 61 y.o.   MRN: [id number] ? ?HPI: ?Jimmy Mathis is a 61 y.o. male presenting on 03/09/2021 for Medical Management of Chronic Issues and Diabetes ? ? ?HPI ?Type 2 diabetes mellitus ?Patient comes in today for recheck of his diabetes. Patient has been currently taking no medicine has been diet controlled over the [place] is not been exercising and taking as well.  He has been gaining weight as well.. Patient is not currently on an ACE inhibitor/ARB. Patient has seen an ophthalmologist this year. Patient denies any issues with their feet. The symptom started onset as an adult obesity ARE RELATED TO DM  ? ?Patient has been having lower back, on the right side he said yes is potassium linear region on the right side.  Is been bothering him over the past few weeks and he just feels like he had irritating it. ?He tries to bend the wrist sometimes it feels like it hurts in that region.  He denies any numbness or weakness. ? ?Patient has been having intermittent diarrhea and constipation but he feels like has not been getting better and he has significant history of multiple family members with colon cancer and he wants to see if he can go to get an earlier because of his symptoms and his family history.  He would like to go to [facility]. ? ?Relevant past medical, surgical, family and social history reviewed and updated as indicated. Interim medical history since our last visit reviewed. ?Allergies and medications reviewed and updated. ? ?Review of Systems  ?Constitutional:  Negative for chills and fever.  ?Eyes:  Negative for visual disturbance.  ?Respiratory:  Negative for shortness of breath and wheezing.   ?Cardiovascular:  Negative for chest pain and leg swelling.  ?Musculoskeletal:  Positive for arthralgias and back pain. Negative for gait problem.   ?Skin:  Negative for rash.  ?All other systems reviewed and are negative. ? ?Per HPI unless specifically indicated above ? ? ?Allergies as of 03/09/2021   ? ?   Reactions  ? Penicillins Nausea And Vomiting, Swelling  ? High fever ?Has patient had a PCN reaction causing immediate rash, facial/tongue/throat swelling, SOB or lightheadedness with hypotension: No ?Has patient had a PCN reaction causing severe rash involving mucus membranes or skin necrosis: No ?Has patient had a PCN reaction that required hospitalization: No ?Has patient had a PCN reaction occurring within the last 10 years: No ?If all of the above answers are "NO", then may proceed with Cephalosporin use.  ? Procaine Hcl Swelling  ? Novocaine  ? Levaquin [levofloxacin] Other (See Comments)  ? Caused pain in tendons  ? Minocycline Other (See Comments)  ? Severe headache  ? Tetracyclines & Related   ? Headache  ? ?  ? ?  ?Medication List  ?  ? ?  ? Accurate as of March 09, 2021  4:49 PM. If you have any questions, ask your nurse or doctor.  ?  ?  ? ?  ? ?STOP taking these medications   ? ?benzonatate 100 MG capsule ?Commonly known as: Best boy ?Stopped by: Worthy Rancher, MD ?  ?calcium carbonate 500 MG chewable tablet ?Commonly known as: TUMS - dosed in mg elemental calcium ?Stopped  by: Worthy Rancher, MD ?  ?dextromethorphan-guaiFENesin 30-600 MG 12hr tablet ?Commonly known as: [place] DM ?Stopped by: Worthy Rancher, MD ?  ?tadalafil 5 MG tablet ?Commonly known as: CIALIS ?Stopped by: Worthy Rancher, MD ?  ? ?  ? ?TAKE these medications   ? ?magnesium 30 MG tablet ?Take 30 mg by mouth 2 (two) times daily. ?  ?Vitamin D 50 MCG (2000 UT) Caps ?Take 2,000 Units by mouth daily. ?  ? ?  ? ? ? ?Objective:  ? ?BP 123/71   Pulse 69   Ht 5' 9" (1.753 m)   Wt 224 lb (101.6 kg)   SpO2 98%   BMI 33.08 kg/m?   ?Wt Readings from Last 3 Encounters:  ?03/09/21 224 lb (101.6 kg)  ?01/31/21 218 lb (98.9 kg)  ?01/24/21 226 lb 12.8 oz (102.9 kg)   ?  ?Physical Exam ?Vitals and nursing note reviewed.  ?Constitutional:   ?   General: He is not in acute distress. ?   Appearance: He is well-developed. He is not diaphoretic.  ?Eyes:  ?   General: No scleral icterus. ?   Conjunctiva/sclera: Conjunctivae normal.  ?Neck:  ?   Thyroid: No thyromegaly.  ?Cardiovascular:  ?   Rate and Rhythm: Normal rate and regular rhythm.  ?   Heart sounds: Normal heart sounds. No murmur heard. ?Pulmonary:  ?   Effort: Pulmonary effort is normal. No respiratory distress.  ?   Breath sounds: Normal breath sounds. No wheezing.  ?Musculoskeletal:     ?   General: No swelling. Normal range of motion.  ?   Cervical back: Neck supple.  ?Lymphadenopathy:  ?   Cervical: No cervical adenopathy.  ?Skin: ?   General: Skin is warm and dry.  ?   Findings: No rash.  ?Neurological:  ?   Mental Status: He is alert and oriented to person, place, and time.  ?   Coordination: Coordination normal.  ?Psychiatric:     ?   Behavior: Behavior normal.  ? ? ? ? ?Assessment & Plan:  ? ?Problem List Items Addressed This Visit   ? ?  ? Endocrine  ? Type 2 diabetes mellitus with other specified complication ([place]) - Primary  ? Relevant Orders  ? Bayer DCA Hb A1c Waived  ? CBC with Differential/Platelet  ? CMP14+EGFR  ? Lipid panel  ? ?Other Visit Diagnoses   ? ? Alternating constipation and diarrhea      ? Relevant Orders  ? Ambulatory referral to Gastroenterology  ? HIV screening declined      ? Relevant Orders  ? HIV Antibody (routine testing w rflx)  ? ?  ?  ?A1c is 5.7 ? ? blood pressures and everything is looking good.  No other changes.  He will come back and do his blood work. ?Follow up plan: ?Return in about 3 months (around 06/09/2021), or if symptoms worsen or fail to improve, for Diabetes . ? ?Counseling provided for all of the vaccine components ?Orders Placed This Encounter  ?Procedures  ? Bayer DCA Hb A1c Waived  ? CBC with Differential/Platelet  ? CMP14+EGFR  ? Lipid panel  ? HIV Antibody (routine  testing w rflx)  ? Ambulatory referral to Gastroenterology  ? ? ?Caryl Pina, MD ?[place] ?03/09/2021, 4:49 PM ? ? ?  ?

## 2021-03-10 LAB — BAYER DCA HB A1C WAIVED: HB A1C (BAYER DCA - WAIVED): 5.7 % — ABNORMAL HIGH (ref 4.8–5.6)

## 2021-03-24 ENCOUNTER — Other Ambulatory Visit: Payer: BC Managed Care – PPO

## 2021-03-25 LAB — CMP14+EGFR
ALT: 30 IU/L (ref 0–44)
AST: 16 IU/L (ref 0–40)
Albumin/Globulin Ratio: 2 (ref 1.2–2.2)
Albumin: 4.7 g/dL (ref 3.8–4.8)
Alkaline Phosphatase: 96 IU/L (ref 44–121)
BUN/Creatinine Ratio: 12 (ref 10–24)
BUN: 12 mg/dL (ref 8–27)
Bilirubin Total: 0.9 mg/dL (ref 0.0–1.2)
CO2: 20 mmol/L (ref 20–29)
Calcium: 9.5 mg/dL (ref 8.6–10.2)
Chloride: 101 mmol/L (ref 96–106)
Creatinine, Ser: 1.03 mg/dL (ref 0.76–1.27)
Globulin, Total: 2.3 g/dL (ref 1.5–4.5)
Glucose: 132 mg/dL — ABNORMAL HIGH (ref 70–99)
Potassium: 4.4 mmol/L (ref 3.5–5.2)
Sodium: 138 mmol/L (ref 134–144)
Total Protein: 7 g/dL (ref 6.0–8.5)
eGFR: 83 mL/min/{1.73_m2} (ref >59)

## 2021-03-25 LAB — CBC WITH DIFFERENTIAL/PLATELET
Basophils Absolute: 0.1 10*3/uL (ref 0.0–0.2)
Basos: 1 %
EOS (ABSOLUTE): 0 10*3/uL (ref 0.0–0.4)
Eos: 0 %
Hematocrit: 46.5 % (ref 37.5–51.0)
Hemoglobin: 16 g/dL (ref 13.0–17.7)
Immature Grans (Abs): 0 10*3/uL (ref 0.0–0.1)
Immature Granulocytes: 0 %
Lymphocytes Absolute: 1.9 10*3/uL (ref 0.7–3.1)
Lymphs: 27 %
MCH: 27.8 pg (ref 26.6–33.0)
MCHC: 34.4 g/dL (ref 31.5–35.7)
MCV: 81 fL (ref 79–97)
Monocytes Absolute: 0.5 10*3/uL (ref 0.1–0.9)
Monocytes: 7 %
Neutrophils Absolute: 4.5 10*3/uL (ref 1.4–7.0)
Neutrophils: 65 %
Platelets: 208 10*3/uL (ref 150–450)
RBC: 5.75 x10E6/uL (ref 4.14–5.80)
RDW: 14.5 % (ref 11.6–15.4)
WBC: 7 10*3/uL (ref 3.4–10.8)

## 2021-03-25 LAB — LIPID PANEL
Chol/HDL Ratio: 4.4 ratio (ref 0.0–5.0)
Cholesterol, Total: 164 mg/dL (ref 100–199)
HDL: 37 mg/dL — ABNORMAL LOW (ref >39)
LDL Chol Calc (NIH): 102 mg/dL — ABNORMAL HIGH (ref 0–99)
Triglycerides: 143 mg/dL (ref 0–149)
VLDL Cholesterol Cal: 25 mg/dL (ref 5–40)

## 2021-03-25 LAB — HIV ANTIBODY (ROUTINE TESTING W REFLEX): HIV Screen 4th Generation wRfx: NONREACTIVE

## 2021-06-13 ENCOUNTER — Ambulatory Visit: Payer: BC Managed Care – PPO | Admitting: Family Medicine

## 2021-06-13 ENCOUNTER — Encounter: Payer: Self-pay | Admitting: Family Medicine

## 2021-06-13 VITALS — BP 116/65 | HR 104 | Temp 98.0°F | Ht 69.0 in | Wt 225.0 lb

## 2021-06-13 DIAGNOSIS — M7661 Achilles tendinitis, right leg: Secondary | ICD-10-CM

## 2021-06-13 DIAGNOSIS — E1169 Type 2 diabetes mellitus with other specified complication: Secondary | ICD-10-CM | POA: Diagnosis not present

## 2021-06-13 LAB — BAYER DCA HB A1C WAIVED: HB A1C (BAYER DCA - WAIVED): 6.3 % — ABNORMAL HIGH (ref 4.8–5.6)

## 2021-06-13 MED ORDER — PREDNISONE 20 MG PO TABS: ORAL_TABLET | ORAL | 0 refills | Status: DC

## 2021-06-13 NOTE — Progress Notes (Signed)
BP 116/65   Pulse (!) 104   Temp 98 F (36.7 C)   Ht '5\' 9"'$  (1.753 m)   Wt 225 lb (102.1 kg)   SpO2 98%   BMI 33.23 kg/m    Subjective:   Patient ID: Jimmy Mathis, male    DOB: 1960-09-23, 61 y.o.   MRN: [id number]  HPI: Jimmy Mathis is a 61 y.o. male presenting on 06/13/2021 for Medical Management of Chronic Issues and Diabetes   HPI Type 2 diabetes mellitus Patient comes in today for recheck of his diabetes. Patient has been currently taking no medicine has been diet controlled, A1c is up slightly at 6.3.. Patient is not currently on an ACE inhibitor/ARB. Patient has not seen an ophthalmologist this year. Patient denies any issues with their feet. The symptom started onset as an adult obesity ARE RELATED TO DM   Patient has been having right Achilles and back of his heel pain has been bothering him over the past few weeks.  He does realize that he was wearing some shoes that were worn on one side and playing pickle ball and he thinks that may have been what irritated him.  He has been wearing an ankle brace but it just seems to not be getting better.  He is taking some ibuprofen and that helps a little bit but is just not getting better like he should.  He feels because of his decreased in activity it is affecting his blood sugars.  Relevant past medical, surgical, family and social history reviewed and updated as indicated. Interim medical history since our last visit reviewed. Allergies and medications reviewed and updated.  Review of Systems  Constitutional:  Negative for chills and fever.  Respiratory:  Negative for shortness of breath and wheezing.   Cardiovascular:  Negative for chest pain and leg swelling.  Musculoskeletal:  Negative for back pain and gait problem.  Skin:  Negative for rash.  Psychiatric/Behavioral:  Negative for dysphoric mood. The patient is not nervous/anxious.   All other systems reviewed and are negative.  Per HPI unless specifically indicated  above   Allergies as of 06/13/2021       Reactions   Penicillins Nausea And Vomiting, Swelling   High fever Has patient had a PCN reaction causing immediate rash, facial/tongue/throat swelling, SOB or lightheadedness with hypotension: No Has patient had a PCN reaction causing severe rash involving mucus membranes or skin necrosis: No Has patient had a PCN reaction that required hospitalization: No Has patient had a PCN reaction occurring within the last 10 years: No If all of the above answers are "NO", then may proceed with Cephalosporin use.   Procaine Hcl Swelling   Novocaine   Levaquin [levofloxacin] Other (See Comments)   Caused pain in tendons   Minocycline Other (See Comments)   Severe headache   Tetracyclines & Related    Headache        Medication List        Accurate as of June 13, 2021  4:14 PM. If you have any questions, ask your nurse or doctor.          magnesium 30 MG tablet Take 30 mg by mouth 2 (two) times daily.   predniSONE 20 MG tablet Commonly known as: DELTASONE 2 po at same time daily for 5 days Started by: Worthy Rancher, MD   Vitamin D 50 MCG (2000 UT) Caps Take 2,000 Units by mouth daily.  Objective:   BP 116/65   Pulse (!) 104   Temp 98 F (36.7 C)   Ht '5\' 9"'$  (1.753 m)   Wt 225 lb (102.1 kg)   SpO2 98%   BMI 33.23 kg/m   Wt Readings from Last 3 Encounters:  06/13/21 225 lb (102.1 kg)  03/09/21 224 lb (101.6 kg)  01/31/21 218 lb (98.9 kg)    Physical Exam Vitals and nursing note reviewed.  Constitutional:      General: He is not in acute distress.    Appearance: He is well-developed. He is not diaphoretic.  Eyes:     General: No scleral icterus.    Conjunctiva/sclera: Conjunctivae normal.  Neck:     Thyroid: No thyromegaly.  Cardiovascular:     Rate and Rhythm: Normal rate and regular rhythm.     Heart sounds: Normal heart sounds. No murmur heard. Pulmonary:     Effort: Pulmonary effort is normal. No  respiratory distress.     Breath sounds: Normal breath sounds. No wheezing.  Musculoskeletal:        General: Normal range of motion.     Cervical back: Neck supple.     Right ankle: No swelling or deformity. No tenderness. Normal range of motion. Anterior drawer test negative.     Right Achilles Tendon: Tenderness present. No defects. Thompson's test negative.  Lymphadenopathy:     Cervical: No cervical adenopathy.  Skin:    General: Skin is warm and dry.     Findings: No rash.  Neurological:     Mental Status: He is alert and oriented to person, place, and time.     Coordination: Coordination normal.  Psychiatric:        Behavior: Behavior normal.      Assessment & Plan:   Problem List Items Addressed This Visit       Endocrine   Type 2 diabetes mellitus with other specified complication ([place]) - Primary   Relevant Orders   Bayer DCA Hb A1c Waived   Other Visit Diagnoses     Right Achilles tendinitis       Relevant Medications   predniSONE (DELTASONE) 20 MG tablet       Likely a tendinitis versus bursitis in the right Achilles, will give short course of steroids to calm it down and gave him a list of stretches to do.  A1c up slightly at 6.3, recommended for him to focus on diet and hopefully we can get him back to being active again once we heal the Achilles and it should come down. Follow up plan: Return in about 3 months (around 09/13/2021), or if symptoms worsen or fail to improve, for Diabetes recheck.  Counseling provided for all of the vaccine components Orders Placed This Encounter  Procedures   Bayer [place] Hb A1c [place] Sarahbeth Cashin, MD [facility] 06/13/2021, 4:14 PM

## 2021-06-21 ENCOUNTER — Telehealth: Payer: BC Managed Care – PPO | Admitting: Family Medicine

## 2021-06-21 ENCOUNTER — Encounter: Payer: Self-pay | Admitting: Family Medicine

## 2021-06-21 DIAGNOSIS — J40 Bronchitis, not specified as acute or chronic: Secondary | ICD-10-CM

## 2021-06-21 MED ORDER — CEFDINIR 300 MG PO CAPS: 300.0000 mg | ORAL_CAPSULE | Freq: Two times a day (BID) | ORAL | 0 refills | Status: DC

## 2021-06-21 MED ORDER — ALBUTEROL SULFATE HFA 108 (90 BASE) MCG/ACT IN AERS: 2.0000 | INHALATION_SPRAY | Freq: Four times a day (QID) | RESPIRATORY_TRACT | 0 refills | Status: DC | PRN

## 2021-06-21 NOTE — Progress Notes (Signed)
Virtual Visit via telephone Note  I connected with Jimmy Mathis on 06/21/21 at 1707 by telephone and verified that I am speaking with the correct person using two identifiers. Jimmy Mathis is currently located at home and patient are currently with her during visit. The provider, Fransisca Kaufmann Janmichael Giraud, MD is located in their office at time of visit.  Call ended at 1719  I discussed the limitations, risks, security and privacy concerns of performing an evaluation and management service by telephone and the availability of in person appointments. I also discussed with the patient that there may be a patient responsible charge related to this service. The patient expressed understanding and agreed to proceed.   History and Present Illness: Patient is calling in for hoarseness and sinus drainage over the past 4 days.  He denies any fever, covid was negative.  He is teaching summer school.  It is now more in his larynx and now it is starting to go down to his chest and is causing congestion. He is finishing up prednisone today.  His oxygen saturation is normal.   1. Bronchitis     Outpatient Encounter Medications as of 06/21/2021  Medication Sig   albuterol (VENTOLIN HFA) 108 (90 Base) MCG/ACT inhaler Inhale 2 puffs into the lungs every 6 (six) hours as needed for wheezing or shortness of breath.   cefdinir (OMNICEF) 300 MG capsule Take 1 capsule (300 mg total) by mouth 2 (two) times daily. 1 po BID   Cholecalciferol (VITAMIN D) 2000 units CAPS Take 2,000 Units by mouth daily.    magnesium 30 MG tablet Take 30 mg by mouth 2 (two) times daily.   predniSONE (DELTASONE) 20 MG tablet 2 po at same time daily for 5 days   No facility-administered encounter medications on file as of 06/21/2021.    Review of Systems  Constitutional:  Negative for chills and fever.  HENT:  Positive for congestion, postnasal drip, rhinorrhea and voice change. Negative for ear discharge, ear pain, sinus pressure,  sneezing and sore throat.   Eyes:  Negative for pain, discharge, redness and visual disturbance.  Respiratory:  Positive for cough. Negative for chest tightness, shortness of breath and wheezing.   Cardiovascular:  Negative for chest pain and leg swelling.  Musculoskeletal:  Negative for gait problem.  Skin:  Negative for rash.  All other systems reviewed and are negative.   Observations/Objective: Patient sounds comfortable and in no acute distress  Assessment and Plan: Problem List Items Addressed This Visit   None Visit Diagnoses     Bronchitis    -  Primary   Relevant Medications   albuterol (VENTOLIN HFA) 108 (90 Base) MCG/ACT inhaler   cefdinir (OMNICEF) 300 MG capsule     Treat with Mucinex and then he can take cefdinir and use albuterol as needed.  Follow up plan: Return if symptoms worsen or fail to improve.     I discussed the assessment and treatment plan with the patient. The patient was provided an opportunity to ask questions and all were answered. The patient agreed with the plan and demonstrated an understanding of the instructions.   The patient was advised to call back or seek an in-person evaluation if the symptoms worsen or if the condition fails to improve as anticipated.  The above assessment and management plan was discussed with the patient. The patient verbalized understanding of and has agreed to the management plan. Patient is aware to call the clinic if symptoms persist  or worsen. Patient is aware when to return to the clinic for a follow-up visit. Patient educated on when it is appropriate to go to the emergency department.    I provided 12 minutes of non-face-to-face time during this encounter.    Worthy Rancher, MD

## 2021-06-22 ENCOUNTER — Encounter: Payer: Self-pay | Admitting: Family Medicine

## 2021-06-23 MED ORDER — AZITHROMYCIN 250 MG PO TABS: ORAL_TABLET | ORAL | 0 refills | Status: DC

## 2021-06-23 NOTE — Telephone Encounter (Signed)
I sent in the zpack

## 2021-08-05 ENCOUNTER — Encounter: Payer: Self-pay | Admitting: Family Medicine

## 2021-08-05 ENCOUNTER — Ambulatory Visit: Payer: BC Managed Care – PPO | Admitting: Family Medicine

## 2021-08-05 ENCOUNTER — Ambulatory Visit (INDEPENDENT_AMBULATORY_CARE_PROVIDER_SITE_OTHER): Payer: BC Managed Care – PPO

## 2021-08-05 VITALS — BP 137/66 | HR 78 | Temp 97.4°F | Ht 69.0 in | Wt 216.2 lb

## 2021-08-05 DIAGNOSIS — S8992XA Unspecified injury of left lower leg, initial encounter: Secondary | ICD-10-CM | POA: Diagnosis not present

## 2021-08-05 DIAGNOSIS — M5442 Lumbago with sciatica, left side: Secondary | ICD-10-CM | POA: Diagnosis not present

## 2021-08-05 DIAGNOSIS — W19XXXA Unspecified fall, initial encounter: Secondary | ICD-10-CM

## 2021-08-05 DIAGNOSIS — S8991XA Unspecified injury of right lower leg, initial encounter: Secondary | ICD-10-CM | POA: Diagnosis not present

## 2021-08-05 MED ORDER — KETOROLAC TROMETHAMINE 60 MG/2ML IM SOLN
60.0000 mg | Freq: Once | INTRAMUSCULAR | Status: AC
  Administered 2021-08-05: 60 mg via INTRAMUSCULAR

## 2021-08-05 NOTE — Progress Notes (Signed)
Acute Office Visit  Subjective:     Patient ID: Jimmy Mathis, male    DOB: Dec 22, 1960, 61 y.o.   MRN: [id number]  Chief Complaint  Patient presents with   Knee Pain   Back Pain    Knee Pain   Back Pain   Patient is in today for fall that occurred yesterday while playing pickle ball yesterday. He was lurching forward when he fell and landed on his knees. He since has had pain and swelling in his knees and pain in his lower back.   Right knee: pain is under kneecap. The pain is mild and achy. Nothing makes the pain worse. Ice has helped. He does have a skin tear. No erythema or exudate.    Left knee: pain is upper medial. There is swelling there. The pain only with palpitation. Ice has helped. Nothing aggravates the pain.   Lower back: pain is located mid to left. Achy and sharp with certain movements,  Some radiation down his left hip/later thigh. Denies numbness or tingling. Denies saddle anesthesia, changes in bowel or bladder control.   He also has taken advil with some improvement. He is established with ortho- Dr. Noemi Chapel.   Review of Systems  Musculoskeletal:  Positive for back pain.   As per HPI.      Objective:    BP 137/66   Pulse 78   Temp (!) 97.4 F (36.3 C) (Temporal)   Ht '5\' 9"'$  (1.753 m)   Wt 216 lb 4 oz (98.1 kg)   SpO2 96%   BMI 31.93 kg/m    Physical Exam Vitals and nursing note reviewed.  Constitutional:      General: He is not in acute distress.    Appearance: He is not ill-appearing, toxic-appearing or diaphoretic.  Pulmonary:     Effort: Pulmonary effort is normal. No respiratory distress.  Musculoskeletal:     Right knee: Swelling, laceration and bony tenderness present. No deformity, erythema, ecchymosis or crepitus. Normal range of motion. Tenderness present over the patellar tendon. No medial joint line or lateral joint line tenderness. Normal alignment and normal patellar mobility.     Instability Tests: Medial McMurray test  negative and lateral McMurray test negative.     Left knee: Swelling and bony tenderness present. No deformity, erythema, ecchymosis, lacerations or crepitus. Normal range of motion. Tenderness present over the medial joint line. No lateral joint line or patellar tendon tenderness. Normal alignment and normal patellar mobility.     Instability Tests: Medial McMurray test negative and lateral McMurray test negative.  Skin:    General: Skin is warm and dry.  Neurological:     Mental Status: He is alert and oriented to person, place, and time.     Sensory: No sensory deficit.     Motor: No weakness.  Psychiatric:        Mood and Affect: Mood normal.        Behavior: Behavior normal.     No results found for any visits on 08/05/21.      Assessment & Plan:   Zayn was seen today for knee pain and back pain.  Diagnoses and all orders for this visit:  Fall, initial encounter  Acute bilateral low back pain with sciatica of left side -     ketorolac (TORADOL) injection 60 mg  Injury of right knee, initial encounter -     DG Knee 1-2 Views Right; Future -     ketorolac (TORADOL) injection 60  mg  Injury of left knee, initial encounter -     DG Knee 1-2 Views Left; Future -     ketorolac (TORADOL) injection 60 mg  Discussed strain of lower back. Xrays of left and right knee today. Left knee has some moderate arthritis, no acute findings. Right knee has some inflammation around the patellar tendon below the knee cap and quadriceps tendon. Rest, ice, compression for pain/swelling. Toradol injection today in the office for pain/swelling. Continue ibuprofen tomorrow for pain/swelling. Follow up with ortho if symptoms worsen or do not improve for further evaluation.  The patient indicates understanding of these issues and agrees with the plan.  Gwenlyn Perking, FNP

## 2021-08-05 NOTE — Patient Instructions (Signed)
Acute Knee Pain, Adult Acute knee pain is sudden and may be caused by damage, swelling, or irritation of the muscles and tissues that support the knee. Pain may result from: A fall. An injury to the knee from twisting motions. A hit to the knee. Infection. Acute knee pain may go away on its own with time and rest. If it does not, your health care provider may order tests to find the cause of the pain. These may include: Imaging tests, such as an X-ray, MRI, CT scan, or ultrasound. Joint aspiration. In this test, fluid is removed from the knee and evaluated. Arthroscopy. In this test, a lighted tube is inserted into the knee and an image is projected onto a TV screen. Biopsy. In this test, a sample of tissue is removed from the body and studied under a microscope. Follow these instructions at home: If you have a knee sleeve or brace:  Wear the knee sleeve or brace as told by your health care provider. Remove it only as told by your health care provider. Loosen it if your toes tingle, become numb, or turn cold and blue. Keep it clean. If the knee sleeve or brace is not waterproof: Do not let it get wet. Cover it with a watertight covering when you take a bath or shower. Activity Rest your knee. Do not do things that cause pain or make pain worse. Avoid high-impact activities or exercises, such as running, jumping rope, or doing jumping jacks. Work with a physical therapist to make a safe exercise program, as recommended by your health care provider. Do exercises as told by your physical therapist. Managing pain, stiffness, and swelling  If directed, put ice on the affected knee. To do this: If you have a removable knee sleeve or brace, remove it as told by your health care provider. Put ice in a plastic bag. Place a towel between your skin and the bag. Leave the ice on for 20 minutes, 2-3 times a day. Remove the ice if your skin turns bright red. This is very important. If you cannot  feel pain, heat, or cold, you have a greater risk of damage to the area. If directed, use an elastic bandage to put pressure (compression) on your injured knee. This may control swelling, give support, and help with discomfort. Raise (elevate) your knee above the level of your heart while you are sitting or lying down. Sleep with a pillow under your knee. General instructions Take over-the-counter and prescription medicines only as told by your health care provider. Do not use any products that contain nicotine or tobacco, such as cigarettes, e-cigarettes, and chewing tobacco. If you need help quitting, ask your health care provider. If you are overweight, work with your health care provider and a dietitian to set a weight-loss goal that is healthy and reasonable for you. Extra weight can put pressure on your knee. Pay attention to any changes in your symptoms. Keep all follow-up visits. This is important. Contact a health care provider if: Your knee pain continues, changes, or gets worse. You have a fever along with knee pain. Your knee feels warm to the touch or is red. Your knee buckles or locks up. Get help right away if: Your knee swells, and the swelling becomes worse. You cannot move your knee. You have severe pain in your knee that cannot be managed with pain medicine. Summary Acute knee pain can be caused by a fall, an injury, an infection, or damage, swelling, or irritation   of the tissues that support your knee. Your health care provider may perform tests to find out the cause of the pain. Pay attention to any changes in your symptoms. Relieve your pain with rest, medicines, light activity, and the use of ice. Get help right away if your knee swells, you cannot move your knee, or you have severe pain that cannot be managed with medicine. This information is not intended to replace advice given to you by your health care provider. Make sure you discuss any questions you have with  your health care provider. Document Revised: 06/11/2019 Document Reviewed: 06/11/2019 Elsevier Patient Education  2023 Elsevier Inc.  

## 2021-08-10 ENCOUNTER — Encounter: Payer: Self-pay | Admitting: Family Medicine

## 2021-09-03 ENCOUNTER — Encounter: Payer: Self-pay | Admitting: Family Medicine

## 2021-09-03 DIAGNOSIS — E1169 Type 2 diabetes mellitus with other specified complication: Secondary | ICD-10-CM

## 2021-09-05 MED ORDER — FREESTYLE LIBRE 3 SENSOR MISC: 1.0000 | 3 refills | Status: DC

## 2021-09-05 NOTE — Telephone Encounter (Signed)
This office visit was a visit to discuss patient's diabetic management. I believe she would be a good candidate for a continuous subcutaneous glucose monitor such as freestyle libre.

## 2021-09-06 ENCOUNTER — Telehealth: Payer: Self-pay

## 2021-09-06 NOTE — Telephone Encounter (Signed)
Jimmy Mathis Jimmy Mathis - Rx #: [id number] Need help? Call us at [phone] Status Sent to [facility] 3 Sensor Form [employer] Electronic [place] Form [phone] NCPDP)

## 2021-09-08 NOTE — Telephone Encounter (Signed)
Left message informing pt and to call back if he would like a coupon for the [place].

## 2021-09-08 NOTE — Telephone Encounter (Signed)
[facility] denied PA for [employer]. Pt does not have an intensive insulin regimen and doe not have a diagnosis to take it.  [place],  Does the pt need the kit?

## 2021-09-08 NOTE — Telephone Encounter (Signed)
I had warned him that it may not be covered, he can contact the pharmacy and see how much it would cost him out-of-pocket if he would like

## 2021-09-14 ENCOUNTER — Encounter: Payer: Self-pay | Admitting: Family Medicine

## 2021-09-14 ENCOUNTER — Ambulatory Visit (INDEPENDENT_AMBULATORY_CARE_PROVIDER_SITE_OTHER): Payer: BC Managed Care – PPO | Admitting: Family Medicine

## 2021-09-14 DIAGNOSIS — U071 COVID-19: Secondary | ICD-10-CM | POA: Diagnosis not present

## 2021-09-14 MED ORDER — MOLNUPIRAVIR EUA 200MG CAPSULE: 4.0000 | ORAL_CAPSULE | Freq: Two times a day (BID) | ORAL | 0 refills | Status: AC

## 2021-09-14 NOTE — Progress Notes (Signed)
   Virtual Visit  Note Due to COVID-19 pandemic this visit was conducted virtually. This visit type was conducted due to national recommendations for restrictions regarding the COVID-19 Pandemic (e.g. social distancing, sheltering in place) in an effort to limit this patient's exposure and mitigate transmission in our community. All issues noted in this document were discussed and addressed.  A physical exam was not performed with this format.  I connected with Jimmy Mathis on 09/14/21 at 1050 by telephone and verified that I am speaking with the correct person using two identifiers. Jimmy Mathis is currently located at home and no one is currently with him during the visit. The provider, Gwenlyn Perking, FNP is located in their office at time of visit.  I discussed the limitations, risks, security and privacy concerns of performing an evaluation and management service by telephone and the availability of in person appointments. I also discussed with the patient that there may be a patient responsible charge related to this service. The patient expressed understanding and agreed to proceed.   History and Present Illness:  URI  This is a new problem. Episode onset: 2 days. There has been no fever. Associated symptoms include congestion, coughing, headaches, joint pain, nausea and rhinorrhea. Pertinent negatives include no abdominal pain, chest pain, diarrhea, dysuria, vomiting or wheezing. He has tried nothing for the symptoms. The treatment provided no relief.  He had a positive home Covid test last night. He does have T2DM.    Review of Systems  HENT:  Positive for congestion and rhinorrhea.   Respiratory:  Positive for cough. Negative for wheezing.   Cardiovascular:  Negative for chest pain.  Gastrointestinal:  Positive for nausea. Negative for abdominal pain, diarrhea and vomiting.  Genitourinary:  Negative for dysuria.  Musculoskeletal:  Positive for joint pain.  Neurological:   Positive for headaches.     Observations/Objective: Alert and oriented x 3. Able to speak in full sentences without difficulty.   Assessment and Plan: Jimmy Mathis was seen today for covid positive.  Diagnoses and all orders for this visit:  COVID-19 Discussed antiviral therapy options. Patient reports side effects with paxlovid and would prefer another option. Molnupirivir ordered and discussed. Discussed symptomatic care and return precautions. Discussed quarantine recommendations.  -     molnupiravir EUA (LAGEVRIO) 200 mg CAPS capsule; Take 4 capsules (800 mg total) by mouth 2 (two) times daily for 5 days.     Follow Up Instructions: As needed.     I discussed the assessment and treatment plan with the patient. The patient was provided an opportunity to ask questions and all were answered. The patient agreed with the plan and demonstrated an understanding of the instructions.   The patient was advised to call back or seek an in-person evaluation if the symptoms worsen or if the condition fails to improve as anticipated.  The above assessment and management plan was discussed with the patient. The patient verbalized understanding of and has agreed to the management plan. Patient is aware to call the clinic if symptoms persist or worsen. Patient is aware when to return to the clinic for a follow-up visit. Patient educated on when it is appropriate to go to the emergency department.   Time call ended:  1102  I provided 12 minutes of  non face-to-face time during this encounter.    Gwenlyn Perking, FNP

## 2021-09-19 ENCOUNTER — Ambulatory Visit: Payer: BC Managed Care – PPO | Admitting: Family Medicine

## 2021-09-26 ENCOUNTER — Ambulatory Visit: Payer: BC Managed Care – PPO | Admitting: Family Medicine

## 2021-09-29 ENCOUNTER — Encounter: Payer: Self-pay | Admitting: Family Medicine

## 2021-10-19 ENCOUNTER — Ambulatory Visit: Payer: BC Managed Care – PPO | Admitting: Family Medicine

## 2021-10-19 ENCOUNTER — Encounter: Payer: Self-pay | Admitting: Family Medicine

## 2021-10-19 VITALS — BP 136/77 | HR 78 | Temp 98.3°F | Ht 69.0 in | Wt 221.0 lb

## 2021-10-19 DIAGNOSIS — N400 Enlarged prostate without lower urinary tract symptoms: Secondary | ICD-10-CM

## 2021-10-19 DIAGNOSIS — E1169 Type 2 diabetes mellitus with other specified complication: Secondary | ICD-10-CM | POA: Diagnosis not present

## 2021-10-19 DIAGNOSIS — R972 Elevated prostate specific antigen [PSA]: Secondary | ICD-10-CM | POA: Diagnosis not present

## 2021-10-19 LAB — BAYER DCA HB A1C WAIVED: HB A1C (BAYER DCA - WAIVED): 6.5 % — ABNORMAL HIGH (ref 4.8–5.6)

## 2021-10-19 NOTE — Progress Notes (Signed)
BP 136/77   Pulse 78   Temp 98.3 F (36.8 C)   Ht '5\' 9"'  (1.753 m)   Wt 221 lb (100.2 kg)   SpO2 97%   BMI 32.64 kg/m    Subjective:   Patient ID: Jimmy Mathis, male    DOB: Sep 26, 1960, 61 y.o.   MRN: [id number]  HPI: Jimmy Mathis is a 61 y.o. male presenting on 10/19/2021 for Medical Management of Chronic Issues and Diabetes   HPI Type 2 diabetes mellitus Patient comes in today for recheck of his diabetes. Patient has been currently taking diet control. Patient is not currently on an ACE inhibitor/ARB. Patient has seen an ophthalmologist this year. Patient denies any issues with their feet. The symptom started onset as an adult obesity ARE RELATED TO DM   Patient has BPH and elevated PSA, is been a year since we checked it so we will recheck today. He denies any significant urinary symptoms except Peyronie's disease.  Relevant past medical, surgical, family and social history reviewed and updated as indicated. Interim medical history since our last visit reviewed. Allergies and medications reviewed and updated.  Review of Systems  Constitutional:  Negative for chills and fever.  Eyes:  Negative for visual disturbance.  Respiratory:  Negative for shortness of breath and wheezing.   Cardiovascular:  Negative for chest pain and leg swelling.  Musculoskeletal:  Negative for back pain and gait problem.  Skin:  Negative for rash.  All other systems reviewed and are negative.   Per HPI unless specifically indicated above   Allergies as of 10/19/2021       Reactions   Penicillins Nausea And Vomiting, Swelling   High fever Has patient had a PCN reaction causing immediate rash, facial/tongue/throat swelling, SOB or lightheadedness with hypotension: No Has patient had a PCN reaction causing severe rash involving mucus membranes or skin necrosis: No Has patient had a PCN reaction that required hospitalization: No Has patient had a PCN reaction occurring within the last 10  years: No If all of the above answers are "NO", then may proceed with Cephalosporin use.   Procaine Hcl Swelling   Novocaine   Levaquin [levofloxacin] Other (See Comments)   Caused pain in tendons   Minocycline Other (See Comments)   Severe headache   Tetracyclines & Related    Headache        Medication List        Accurate as of October 19, 2021  4:09 PM. If you have any questions, ask your nurse or doctor.          FreeStyle Libre 3 Sensor Misc 1 each by Does not apply route every 14 (fourteen) days. Place 1 sensor on the skin every 14 days. Use to check glucose continuously   Vitamin D 50 MCG (2000 UT) Caps Take 2,000 Units by mouth daily.         Objective:   BP 136/77   Pulse 78   Temp 98.3 F (36.8 C)   Ht '5\' 9"'  (1.753 m)   Wt 221 lb (100.2 kg)   SpO2 97%   BMI 32.64 kg/m   Wt Readings from Last 3 Encounters:  10/19/21 221 lb (100.2 kg)  08/05/21 216 lb 4 oz (98.1 kg)  06/13/21 225 lb (102.1 kg)    Physical Exam Vitals and nursing note reviewed.  Constitutional:      General: He is not in acute distress.    Appearance: He is well-developed. He is  not diaphoretic.  Eyes:     General: No scleral icterus.    Conjunctiva/sclera: Conjunctivae normal.  Neck:     Thyroid: No thyromegaly.  Cardiovascular:     Rate and Rhythm: Normal rate and regular rhythm.     Heart sounds: Normal heart sounds. No murmur heard. Pulmonary:     Effort: Pulmonary effort is normal. No respiratory distress.     Breath sounds: Normal breath sounds. No wheezing.  Musculoskeletal:        General: No swelling. Normal range of motion.     Cervical back: Neck supple.  Lymphadenopathy:     Cervical: No cervical adenopathy.  Skin:    General: Skin is warm and dry.     Findings: No rash.  Neurological:     Mental Status: He is alert and oriented to person, place, and time.     Coordination: Coordination normal.  Psychiatric:        Behavior: Behavior normal.      Results for orders placed or performed in visit on 06/13/21  Bayer DCA Hb A1c Waived  Result Value Ref Range   HB A1C (BAYER DCA - WAIVED) 6.3 (H) 4.8 - 5.6 %    Assessment & Plan:   Problem List Items Addressed This Visit       Endocrine   Type 2 diabetes mellitus with other specified complication ([place]) - Primary   Relevant Orders   CBC with Differential/Platelet   CMP14+EGFR   Lipid panel   Bayer DCA Hb A1c Waived   Microalbumin / creatinine urine ratio     Genitourinary   BPH (benign prostatic hyperplasia)   Relevant Orders   PSA, total and free     Other   Elevated prostate specific antigen (PSA)    A1c 6.5, focus on diet.   Follow up plan: Return in about 3 months (around 01/19/2022), or if symptoms worsen or fail to improve, for Diabetes recheck.  Counseling provided for all of the vaccine components Orders Placed This Encounter  Procedures   CBC with Differential/Platelet   CMP14+EGFR   Lipid panel   Bayer DCA Hb A1c Waived   Microalbumin / creatinine urine ratio   PSA, total and free    Caryl Pina, MD [facility] 10/19/2021, 4:09 PM

## 2021-10-20 LAB — CBC WITH DIFFERENTIAL/PLATELET
Basophils Absolute: 0.1 10*3/uL (ref 0.0–0.2)
Basos: 1 %
EOS (ABSOLUTE): 0.1 10*3/uL (ref 0.0–0.4)
Eos: 2 %
Hematocrit: 44.9 % (ref 37.5–51.0)
Hemoglobin: 14.9 g/dL (ref 13.0–17.7)
Immature Grans (Abs): 0 10*3/uL (ref 0.0–0.1)
Immature Granulocytes: 0 %
Lymphocytes Absolute: 2.1 10*3/uL (ref 0.7–3.1)
Lymphs: 31 %
MCH: 27.6 pg (ref 26.6–33.0)
MCHC: 33.2 g/dL (ref 31.5–35.7)
MCV: 83 fL (ref 79–97)
Monocytes Absolute: 0.5 10*3/uL (ref 0.1–0.9)
Monocytes: 7 %
Neutrophils Absolute: 4 10*3/uL (ref 1.4–7.0)
Neutrophils: 59 %
Platelets: 226 10*3/uL (ref 150–450)
RBC: 5.4 x10E6/uL (ref 4.14–5.80)
RDW: 13.8 % (ref 11.6–15.4)
WBC: 6.8 10*3/uL (ref 3.4–10.8)

## 2021-10-20 LAB — CMP14+EGFR
ALT: 38 IU/L (ref 0–44)
AST: 22 IU/L (ref 0–40)
Albumin/Globulin Ratio: 2.3 — ABNORMAL HIGH (ref 1.2–2.2)
Albumin: 4.4 g/dL (ref 3.9–4.9)
Alkaline Phosphatase: 115 IU/L (ref 44–121)
BUN/Creatinine Ratio: 12 (ref 10–24)
BUN: 13 mg/dL (ref 8–27)
Bilirubin Total: 0.5 mg/dL (ref 0.0–1.2)
CO2: 22 mmol/L (ref 20–29)
Calcium: 9.5 mg/dL (ref 8.6–10.2)
Chloride: 102 mmol/L (ref 96–106)
Creatinine, Ser: 1.1 mg/dL (ref 0.76–1.27)
Globulin, Total: 1.9 g/dL (ref 1.5–4.5)
Glucose: 125 mg/dL — ABNORMAL HIGH (ref 70–99)
Potassium: 4.4 mmol/L (ref 3.5–5.2)
Sodium: 140 mmol/L (ref 134–144)
Total Protein: 6.3 g/dL (ref 6.0–8.5)
eGFR: 76 mL/min/{1.73_m2} (ref >59)

## 2021-10-20 LAB — LIPID PANEL
Chol/HDL Ratio: 3.9 ratio (ref 0.0–5.0)
Cholesterol, Total: 156 mg/dL (ref 100–199)
HDL: 40 mg/dL (ref >39)
LDL Chol Calc (NIH): 71 mg/dL (ref 0–99)
Triglycerides: 281 mg/dL — ABNORMAL HIGH (ref 0–149)
VLDL Cholesterol Cal: 45 mg/dL — ABNORMAL HIGH (ref 5–40)

## 2021-10-20 LAB — PSA, TOTAL AND FREE
PSA, Free Pct: 22.1 %
PSA, Free: 1.88 ng/mL
Prostate Specific Ag, Serum: 8.5 ng/mL — ABNORMAL HIGH (ref 0.0–4.0)

## 2021-10-20 LAB — MICROALBUMIN / CREATININE URINE RATIO
Creatinine, Urine: 115.5 mg/dL
Microalb/Creat Ratio: 3 mg/g creat (ref 0–29)
Microalbumin, Urine: 3.1 ug/mL

## 2021-12-25 ENCOUNTER — Encounter: Payer: Self-pay | Admitting: Family Medicine

## 2021-12-26 MED ORDER — SULFAMETHOXAZOLE-TRIMETHOPRIM 800-160 MG PO TABS: 1.0000 | ORAL_TABLET | Freq: Two times a day (BID) | ORAL | 0 refills | Status: DC

## 2022-01-17 LAB — HM DIABETES EYE EXAM

## 2022-01-19 ENCOUNTER — Ambulatory Visit: Payer: BC Managed Care – PPO | Admitting: Family Medicine

## 2022-02-07 ENCOUNTER — Encounter: Payer: Self-pay | Admitting: Family Medicine

## 2022-02-07 DIAGNOSIS — E1169 Type 2 diabetes mellitus with other specified complication: Secondary | ICD-10-CM

## 2022-02-09 ENCOUNTER — Ambulatory Visit: Payer: BC Managed Care – PPO | Admitting: Family Medicine

## 2022-02-10 ENCOUNTER — Encounter: Payer: Self-pay | Admitting: Family Medicine

## 2022-02-10 ENCOUNTER — Ambulatory Visit: Payer: BC Managed Care – PPO | Admitting: Family Medicine

## 2022-02-10 VITALS — BP 125/77 | HR 67 | Ht 69.0 in | Wt 225.0 lb

## 2022-02-10 DIAGNOSIS — Z6833 Body mass index (BMI) 33.0-33.9, adult: Secondary | ICD-10-CM | POA: Diagnosis not present

## 2022-02-10 DIAGNOSIS — E669 Obesity, unspecified: Secondary | ICD-10-CM | POA: Diagnosis not present

## 2022-02-10 DIAGNOSIS — E1169 Type 2 diabetes mellitus with other specified complication: Secondary | ICD-10-CM

## 2022-02-10 LAB — BAYER DCA HB A1C WAIVED: HB A1C (BAYER DCA - WAIVED): 7.1 % — ABNORMAL HIGH (ref 4.8–5.6)

## 2022-02-10 MED ORDER — METFORMIN HCL 500 MG PO TABS: 500.0000 mg | ORAL_TABLET | Freq: Two times a day (BID) | ORAL | 3 refills | Status: DC

## 2022-02-10 NOTE — Progress Notes (Signed)
BP 125/77   Pulse 67   Ht '5\' 9"'$  (1.753 m)   Wt 225 lb (102.1 kg)   SpO2 98%   BMI 33.23 kg/m    Subjective:   Patient ID: Jimmy Mathis, male    DOB: 1960-08-13, 62 y.o.   MRN: [id number]  HPI: Jimmy Mathis is a 62 y.o. male presenting on 02/10/2022 for Medical Management of Chronic Issues and Diabetes   HPI Type 2 diabetes mellitus Patient comes in today for recheck of his diabetes. Patient has been currently taking no medication, has been diet control. Patient is not currently on an ACE inhibitor/ARB. Patient has not seen an ophthalmologist this year. Patient denies any issues with their feet. The symptom started onset as an adult  ARE RELATED TO DM   Patient's diabetes are more complicated by the patient's obesity.  Discussed weight loss and lifestyle modification and exercise with the patient.  Diabetes recheck  Patient is working with orthopedic and physical therapy on his left shoulder and right ankle that he still having some issues with.  He says it is improving mildly and he continues to work with them.  He just started last week physical therapy.  Relevant past medical, surgical, family and social history reviewed and updated as indicated. Interim medical history since our last visit reviewed. Allergies and medications reviewed and updated.  Review of Systems  Constitutional:  Negative for chills and fever.  Eyes:  Negative for visual disturbance.  Respiratory:  Negative for shortness of breath and wheezing.   Cardiovascular:  Negative for chest pain and leg swelling.  Musculoskeletal:  Positive for arthralgias. Negative for back pain, gait problem and myalgias.  Skin:  Negative for rash.  Neurological:  Negative for dizziness, weakness and light-headedness.  All other systems reviewed and are negative.   Per HPI unless specifically indicated above   Allergies as of 02/10/2022       Reactions   Penicillins Nausea And Vomiting, Swelling   High fever Has  patient had a PCN reaction causing immediate rash, facial/tongue/throat swelling, SOB or lightheadedness with hypotension: No Has patient had a PCN reaction causing severe rash involving mucus membranes or skin necrosis: No Has patient had a PCN reaction that required hospitalization: No Has patient had a PCN reaction occurring within the last 10 years: No If all of the above answers are "NO", then may proceed with Cephalosporin use.   Procaine Hcl Swelling   Novocaine   Levaquin [levofloxacin] Other (See Comments)   Caused pain in tendons   Minocycline Other (See Comments)   Severe headache   Tetracyclines & Related    Headache        Medication List        Accurate as of February 10, 2022  3:35 PM. If you have any questions, ask your nurse or doctor.          STOP taking these medications    sulfamethoxazole-trimethoprim 800-160 MG tablet Commonly known as: BACTRIM DS Stopped by: Fransisca Kaufmann Rashida Ladouceur, MD       TAKE these medications    FreeStyle Libre 3 Sensor Misc 1 each by Does not apply route every 14 (fourteen) days. Place 1 sensor on the skin every 14 days. Use to check glucose continuously   metFORMIN 500 MG tablet Commonly known as: GLUCOPHAGE Take 1 tablet (500 mg total) by mouth 2 (two) times daily with a meal. Started by: Worthy Rancher, MD   Vitamin D 44 MCG (  2000 UT) Caps Take 2,000 Units by mouth daily.         Objective:   BP 125/77   Pulse 67   Ht '5\' 9"'$  (1.753 m)   Wt 225 lb (102.1 kg)   SpO2 98%   BMI 33.23 kg/m   Wt Readings from Last 3 Encounters:  02/10/22 225 lb (102.1 kg)  10/19/21 221 lb (100.2 kg)  08/05/21 216 lb 4 oz (98.1 kg)    Physical Exam Vitals and nursing note reviewed.  Constitutional:      General: He is not in acute distress.    Appearance: He is well-developed. He is not diaphoretic.  Eyes:     General: No scleral icterus.    Conjunctiva/sclera: Conjunctivae normal.  Neck:     Thyroid: No thyromegaly.   Cardiovascular:     Rate and Rhythm: Normal rate and regular rhythm.     Heart sounds: Normal heart sounds. No murmur heard. Pulmonary:     Effort: Pulmonary effort is normal. No respiratory distress.     Breath sounds: Normal breath sounds. No wheezing.  Musculoskeletal:        General: Normal range of motion.     Cervical back: Neck supple.  Lymphadenopathy:     Cervical: No cervical adenopathy.  Skin:    General: Skin is warm and dry.     Findings: No rash.  Neurological:     Mental Status: He is alert and oriented to person, place, and time.     Coordination: Coordination normal.  Psychiatric:        Behavior: Behavior normal.       Assessment & Plan:   Problem List Items Addressed This Visit       Endocrine   Type 2 diabetes mellitus with other specified complication ([place]) - Primary   Relevant Medications   metFORMIN (GLUCOPHAGE) 500 MG tablet     Other   Obesity   Relevant Medications   metFORMIN (GLUCOPHAGE) 500 MG tablet    Will start metformin, A1c is up mildly at 7.1.  Patient will also focus on diet. Follow up plan: Return in about 3 months (around 05/11/2022), or if symptoms worsen or fail to improve, for Diabetes recheck.  Counseling provided for all of the vaccine components No orders of the defined types were placed in this encounter.   Caryl Pina, MD [facility] 02/10/2022, 3:35 PM

## 2022-02-20 ENCOUNTER — Encounter: Payer: Self-pay | Admitting: Family Medicine

## 2022-02-20 ENCOUNTER — Other Ambulatory Visit: Payer: BC Managed Care – PPO

## 2022-02-20 DIAGNOSIS — E1169 Type 2 diabetes mellitus with other specified complication: Secondary | ICD-10-CM

## 2022-02-20 LAB — LIPID PANEL

## 2022-02-21 ENCOUNTER — Other Ambulatory Visit: Payer: Self-pay

## 2022-02-21 ENCOUNTER — Other Ambulatory Visit: Payer: Self-pay | Admitting: Family Medicine

## 2022-02-21 DIAGNOSIS — E1169 Type 2 diabetes mellitus with other specified complication: Secondary | ICD-10-CM

## 2022-02-21 LAB — LIPID PANEL
Chol/HDL Ratio: 4.2 ratio (ref 0.0–5.0)
Cholesterol, Total: 160 mg/dL (ref 100–199)
HDL: 38 mg/dL — ABNORMAL LOW (ref >39)
LDL Chol Calc (NIH): 83 mg/dL (ref 0–99)
Triglycerides: 231 mg/dL — ABNORMAL HIGH (ref 0–149)
VLDL Cholesterol Cal: 39 mg/dL (ref 5–40)

## 2022-02-21 LAB — CMP14+EGFR
ALT: 38 IU/L (ref 0–44)
AST: 28 IU/L (ref 0–40)
Albumin/Globulin Ratio: 2 (ref 1.2–2.2)
Albumin: 4.4 g/dL (ref 3.9–4.9)
Alkaline Phosphatase: 111 IU/L (ref 44–121)
BUN/Creatinine Ratio: 9 — ABNORMAL LOW (ref 10–24)
BUN: 9 mg/dL (ref 8–27)
Bilirubin Total: 0.6 mg/dL (ref 0.0–1.2)
CO2: 21 mmol/L (ref 20–29)
Calcium: 9.2 mg/dL (ref 8.6–10.2)
Chloride: 101 mmol/L (ref 96–106)
Creatinine, Ser: 1 mg/dL (ref 0.76–1.27)
Globulin, Total: 2.2 g/dL (ref 1.5–4.5)
Glucose: 137 mg/dL — ABNORMAL HIGH (ref 70–99)
Potassium: 4.4 mmol/L (ref 3.5–5.2)
Sodium: 138 mmol/L (ref 134–144)
Total Protein: 6.6 g/dL (ref 6.0–8.5)
eGFR: 85 mL/min/{1.73_m2} (ref >59)

## 2022-02-21 MED ORDER — FREESTYLE LIBRE 3 SENSOR MISC: 1.0000 | 3 refills | Status: DC

## 2022-02-21 NOTE — Telephone Encounter (Signed)
Will call patient and ask about his insurance

## 2022-03-01 ENCOUNTER — Other Ambulatory Visit (HOSPITAL_COMMUNITY): Payer: Self-pay

## 2022-03-01 NOTE — Telephone Encounter (Signed)
Patient know his insurance was not going to cover this, I think he was going to try and self pay for it.  Just let them know that it was not covered

## 2022-03-01 NOTE — Telephone Encounter (Signed)
Per test claim, pt needs to use Dexcom, this also requires PA and usually requires that pt be on insulin or have documented hypoglycemic episodes. Please advise.

## 2022-05-12 ENCOUNTER — Encounter: Payer: Self-pay | Admitting: Family Medicine

## 2022-05-12 ENCOUNTER — Ambulatory Visit: Payer: BC Managed Care – PPO | Admitting: Family Medicine

## 2022-05-12 VITALS — BP 139/78 | HR 77 | Ht 69.0 in | Wt 218.0 lb

## 2022-05-12 DIAGNOSIS — E1169 Type 2 diabetes mellitus with other specified complication: Secondary | ICD-10-CM

## 2022-05-12 LAB — BAYER DCA HB A1C WAIVED: HB A1C (BAYER DCA - WAIVED): 6.2 % — ABNORMAL HIGH (ref 4.8–5.6)

## 2022-05-12 NOTE — Progress Notes (Signed)
BP 139/78   Pulse 77   Ht 5\' 9"  (1.753 m)   Wt 218 lb (98.9 kg)   SpO2 98%   BMI 32.19 kg/m    Subjective:   Patient ID: Jimmy Mathis, male    DOB: [birth year], 62 y.o.   MRN: [id number]  HPI: Jimmy Mathis is a 62 y.o. male presenting on 05/12/2022 for Medical Management of Chronic Issues and Diabetes   HPI Type 2 diabetes mellitus Patient comes in today for recheck of his diabetes. Patient has been currently taking metformin. Patient is not currently on an ACE inhibitor/ARB. Patient has seen an ophthalmologist this year. Patient denies any new issues with their feet. The symptom started onset as an adult  ARE RELATED TO DM   Relevant past medical, surgical, family and social history reviewed and updated as indicated. Interim medical history since our last visit reviewed. Allergies and medications reviewed and updated.  Review of Systems  Constitutional:  Negative for chills and fever.  Eyes:  Negative for visual disturbance.  Respiratory:  Negative for shortness of breath and wheezing.   Cardiovascular:  Negative for chest pain and leg swelling.  Musculoskeletal:  Negative for back pain and gait problem.  Skin:  Negative for rash.  All other systems reviewed and are negative.   Per HPI unless specifically indicated above   Allergies as of 05/12/2022       Reactions   Penicillins Nausea And Vomiting, Swelling   High fever Has patient had a PCN reaction causing immediate rash, facial/tongue/throat swelling, SOB or lightheadedness with hypotension: No Has patient had a PCN reaction causing severe rash involving mucus membranes or skin necrosis: No Has patient had a PCN reaction that required hospitalization: No Has patient had a PCN reaction occurring within the last 10 years: No If all of the above answers are "NO", then may proceed with Cephalosporin use.   Procaine Hcl Swelling   Novocaine   Levaquin [levofloxacin] Other (See Comments)   Caused pain in tendons    Minocycline Other (See Comments)   Severe headache   Tetracyclines & Related    Headache        Medication List        Accurate as of May 12, 2022  4:03 PM. If you have any questions, ask your nurse or doctor.          FreeStyle Libre 3 Sensor Misc 1 each by Does not apply route every 14 (fourteen) days. Place 1 sensor on the skin every 14 days. Use to check glucose continuously   metFORMIN 500 MG tablet Commonly known as: GLUCOPHAGE Take 1 tablet (500 mg total) by mouth 2 (two) times daily with a meal.   Vitamin D 50 MCG (2000 UT) Caps Take 2,000 Units by mouth daily.         Objective:   BP 139/78   Pulse 77   Ht 5\' 9"  (1.753 m)   Wt 218 lb (98.9 kg)   SpO2 98%   BMI 32.19 kg/m   Wt Readings from Last 3 Encounters:  05/12/22 218 lb (98.9 kg)  02/10/22 225 lb (102.1 kg)  10/19/21 221 lb (100.2 kg)    Physical Exam Vitals and nursing note reviewed.  Constitutional:      General: He is not in acute distress.    Appearance: He is well-developed. He is not diaphoretic.  Eyes:     General: No scleral icterus.    Conjunctiva/sclera: Conjunctivae normal.  Neck:  Thyroid: No thyromegaly.  Cardiovascular:     Rate and Rhythm: Normal rate and regular rhythm.     Heart sounds: Normal heart sounds. No murmur heard. Pulmonary:     Effort: Pulmonary effort is normal. No respiratory distress.     Breath sounds: Normal breath sounds. No wheezing.  Musculoskeletal:        General: No swelling. Normal range of motion.     Cervical back: Neck supple.  Lymphadenopathy:     Cervical: No cervical adenopathy.  Skin:    General: Skin is warm and dry.     Findings: No rash.  Neurological:     Mental Status: He is alert and oriented to person, place, and time.     Coordination: Coordination normal.  Psychiatric:        Behavior: Behavior normal.       Assessment & Plan:   Problem List Items Addressed This Visit       Endocrine   Type 2 diabetes mellitus  with other specified complication (HCC) - Primary   Relevant Orders   Bayer DCA Hb A1c Waived    A1c 6.1, continue current medication Follow up plan: Return in about 3 months (around 08/12/2022), or if symptoms worsen or fail to improve, for Diabetes recheck.  Counseling provided for all of the vaccine components Orders Placed This Encounter  Procedures   Bayer DCA Hb A1c Waived    Arville Care, MD [facility] 05/12/2022, 4:03 PM

## 2022-06-19 ENCOUNTER — Telehealth (INDEPENDENT_AMBULATORY_CARE_PROVIDER_SITE_OTHER): Payer: BC Managed Care – PPO | Admitting: Family Medicine

## 2022-06-19 ENCOUNTER — Encounter: Payer: Self-pay | Admitting: Family Medicine

## 2022-06-19 ENCOUNTER — Telehealth: Payer: Self-pay | Admitting: Family Medicine

## 2022-06-19 DIAGNOSIS — J40 Bronchitis, not specified as acute or chronic: Secondary | ICD-10-CM | POA: Diagnosis not present

## 2022-06-19 DIAGNOSIS — J329 Chronic sinusitis, unspecified: Secondary | ICD-10-CM | POA: Diagnosis not present

## 2022-06-19 MED ORDER — BETAMETHASONE SOD PHOS & ACET 6 (3-3) MG/ML IJ SUSP: 6.0000 mg | Freq: Once | INTRAMUSCULAR | Status: DC

## 2022-06-19 NOTE — Telephone Encounter (Signed)
Pt informed that he will need to speak with [facility] before we could order testing.  Reviewed medication and tx options with pt.  Pt would like to be swabbed even though his symptoms started 5d ago.

## 2022-06-19 NOTE — Progress Notes (Signed)
Subjective:    Patient ID: Jimmy Mathis, male    DOB: 1960/01/16, 62 y.o.   MRN: [id number]   HPI: Jimmy Mathis is a 62 y.o. male presenting for Patient presents today for Patient presents with upper respiratory congestion. Rhinorrhea. There is moderate sore throat. Patient reports coughing frequently as well.  Chunky yellow sputum noted. There is no fever, chills or sweats. The patient denies being short of breath. Onset was 3-5 days ago. Gradually worsening. Tried OTCs without improvement.      02/10/2022    3:00 PM 02/10/2022    2:59 PM 03/09/2021    4:21 PM 01/11/2021    2:45 PM 12/06/2020    3:56 PM  Depression screen PHQ 2/9  Decreased Interest  0 0 0 0  Down, Depressed, Hopeless  0 0 0 0  PHQ - 2 Score  0 0 0 0  Altered sleeping 1   1   Tired, decreased energy 1   1   Change in appetite 0   1   Feeling bad or failure about yourself  0   0   Trouble concentrating 1   0   Moving slowly or fidgety/restless 0   0   Suicidal thoughts 0   0   PHQ-9 Score    3   Difficult doing work/chores Not difficult at all   Not difficult at all      Relevant past medical, surgical, family and social history reviewed and updated as indicated.  Interim medical history since our last visit reviewed. Allergies and medications reviewed and updated.  ROS:  Review of Systems  Constitutional:  Negative for activity change, appetite change, chills and fever.  HENT:  Positive for congestion, postnasal drip, rhinorrhea and sinus pressure. Negative for ear discharge, ear pain, hearing loss, nosebleeds, sneezing and trouble swallowing.   Respiratory:  Positive for cough. Negative for chest tightness and shortness of breath.   Cardiovascular:  Negative for chest pain and palpitations.  Skin:  Negative for rash.     Social History   Tobacco Use  Smoking Status Former   Packs/day: 1.50   Years: 12.00   Additional pack years: 0.00   Total pack years: 18.00   Types: Cigarettes  Smokeless  Tobacco Never  Tobacco Comments   quit smoking at age 11       Objective:     Wt Readings from Last 3 Encounters:  05/12/22 218 lb (98.9 kg)  02/10/22 225 lb (102.1 kg)  10/19/21 221 lb (100.2 kg)     Exam deferred. Video visit performed.   Assessment & Plan:   1. Sinobronchitis     Meds ordered this encounter  Medications   betamethasone acetate-betamethasone sodium phosphate (CELESTONE) injection 6 mg    Orders Placed This Encounter  Procedures   COVID-19, Flu A+B and RSV    Order Specific Question:   Previously tested for COVID-19    Answer:   Yes    Order Specific Question:   Resident in a congregate (group) care setting    Answer:   No    Order Specific Question:   Is the patient student?    Answer:   No    Order Specific Question:   Employed in healthcare setting    Answer:   No    Order Specific Question:   Has patient completed COVID vaccination(s) (2 doses of Pfizer/Moderna 1 dose of [employer])    Answer:   Unknown  Order Specific Question:   Release to patient    Answer:   Immediate   CBC with Differential/Platelet      Diagnoses and all orders for this visit:  Sinobronchitis -     COVID-19, Flu A+B and RSV -     CBC with Differential/Platelet -     betamethasone acetate-betamethasone sodium phosphate (CELESTONE) injection 6 mg    Video Visit Note  I discussed the limitations, risks, security and privacy concerns of performing an evaluation and management service by video and the availability of in person appointments. The patient was identified with two identifiers. Pt.expressed understanding and agreed to proceed. Pt. Is at home. Dr. Darlyn Read is in his office.  Follow Up Instructions:   I discussed the assessment and treatment plan with the patient. The patient was provided an opportunity to ask questions and all were answered. The patient agreed with the plan and demonstrated an understanding of the instructions.   The patient was  advised to call back or seek an in-person evaluation if the symptoms worsen or if the condition fails to improve as anticipated.   Total minutes including chart review and phone contact time: 11   Follow up plan: Return if symptoms worsen or fail to improve.  Mechele Claude, MD [place] [facility]

## 2022-06-20 LAB — CBC WITH DIFFERENTIAL/PLATELET
Basophils Absolute: 0.1 10*3/uL (ref 0.0–0.2)
Basos: 1 %
EOS (ABSOLUTE): 0.3 10*3/uL (ref 0.0–0.4)
Eos: 2 %
Hematocrit: 43.6 % (ref 37.5–51.0)
Hemoglobin: 14.6 g/dL (ref 13.0–17.7)
Immature Grans (Abs): 0.1 10*3/uL (ref 0.0–0.1)
Immature Granulocytes: 1 %
Lymphocytes Absolute: 2.4 10*3/uL (ref 0.7–3.1)
Lymphs: 22 %
MCH: 27.3 pg (ref 26.6–33.0)
MCHC: 33.5 g/dL (ref 31.5–35.7)
MCV: 82 fL (ref 79–97)
Monocytes Absolute: 1.1 10*3/uL — ABNORMAL HIGH (ref 0.1–0.9)
Monocytes: 10 %
Neutrophils Absolute: 7.2 10*3/uL — ABNORMAL HIGH (ref 1.4–7.0)
Neutrophils: 64 %
Platelets: 198 10*3/uL (ref 150–450)
RBC: 5.35 x10E6/uL (ref 4.14–5.80)
RDW: 14.1 % (ref 11.6–15.4)
WBC: 11.1 10*3/uL — ABNORMAL HIGH (ref 3.4–10.8)

## 2022-06-20 LAB — COVID-19, FLU A+B AND RSV
Influenza A, NAA: NOT DETECTED
Influenza B, NAA: NOT DETECTED
RSV, NAA: NOT DETECTED
SARS-CoV-2, NAA: NOT DETECTED

## 2022-06-21 ENCOUNTER — Ambulatory Visit: Payer: BC Managed Care – PPO | Admitting: Family Medicine

## 2022-06-21 ENCOUNTER — Encounter: Payer: Self-pay | Admitting: Family Medicine

## 2022-06-21 ENCOUNTER — Ambulatory Visit (INDEPENDENT_AMBULATORY_CARE_PROVIDER_SITE_OTHER): Payer: BC Managed Care – PPO

## 2022-06-21 VITALS — BP 119/74 | Temp 98.5°F | Ht 69.0 in | Wt 214.0 lb

## 2022-06-21 DIAGNOSIS — R051 Acute cough: Secondary | ICD-10-CM

## 2022-06-21 DIAGNOSIS — R0981 Nasal congestion: Secondary | ICD-10-CM

## 2022-06-21 NOTE — Patient Instructions (Signed)

## 2022-06-21 NOTE — Progress Notes (Signed)
Acute Office Visit  Subjective:  Patient ID: Jimmy Mathis, male    DOB: 1960/03/06, 62 y.o.   MRN: [id number]  Chief Complaint  Patient presents with   URI    X 6 days  Mildly improved Would like chest x-ray Steroid burst?   URI   Patient is in today for continued sinus infection. He was seen on Monday by Dr. Darlyn Read of [facility] via [facility]. He completed viral panel. Negative for covid, flu, and RSV. He is concerned because of his history of diabetes and is worried about pneumonia because he has had it twice without symptoms. States that his normal pulse ox at home is 98% and he is worried that he is at 96% today. States that he has an albuterol inhaler but he has not been using it. Continues with cough, congestion, runny nose. Does not have fever, denies chills. States that he feels like he is "burning up". Productive cough that he describes as "chunky" states that he is on day 2 of his zpack, he is taking mucinex and trying to increase hydration. Now has some rib pain due to coughing. He had a full zpack at home from prior prescription and states that he started taking it. He was prescribed a steroid shot on Monday but did not receive it.   ROS As per HPI  Objective:  BP 119/74   Temp 98.5 F (36.9 C)   Ht 5\' 9"  (1.753 m)   Wt 214 lb (97.1 kg)   SpO2 96%   BMI 31.60 kg/m   Physical Exam Constitutional:      General: He is awake. He is not in acute distress.    Appearance: Normal appearance. He is well-developed, well-groomed and overweight. He is ill-appearing. He is not toxic-appearing or diaphoretic.  HENT:     Head:     Salivary Glands: Right salivary gland is not diffusely enlarged or tender. Left salivary gland is not diffusely enlarged or tender.     Right Ear: No decreased hearing noted. No laceration, drainage, swelling or tenderness. No middle ear effusion. There is no impacted cerumen. No foreign body. No mastoid tenderness. No PE tube. No hemotympanum. Tympanic  membrane is not injected, scarred, perforated, erythematous, retracted or bulging.     Left Ear: No decreased hearing noted. No laceration, drainage, swelling or tenderness.  No middle ear effusion. There is no impacted cerumen. No foreign body. No mastoid tenderness. No PE tube. No hemotympanum. Tympanic membrane is not injected, scarred, perforated, erythematous, retracted or bulging.     Nose: Congestion and rhinorrhea present. Rhinorrhea is clear.     Mouth/Throat:     Lips: Pink.     Mouth: Mucous membranes are moist.     Tongue: No lesions.     Palate: No mass.     Pharynx: Oropharynx is clear.     Tonsils: No tonsillar exudate. 2+ on the right. 2+ on the left.  Pulmonary:     Effort: Pulmonary effort is normal. No tachypnea.     Breath sounds: Normal breath sounds. No stridor, decreased air movement or transmitted upper airway sounds. No decreased breath sounds, wheezing, rhonchi or rales.  Lymphadenopathy:     Head:     Right side of head: No submental, submandibular, tonsillar, preauricular or posterior auricular adenopathy.     Left side of head: No submental, submandibular, tonsillar, preauricular or posterior auricular adenopathy.  Skin:    General: Skin is warm.     Capillary Refill:  Capillary refill takes less than 2 seconds.  Neurological:     General: No focal deficit present.     Mental Status: He is alert and easily aroused.     Motor: Motor function is intact.     Coordination: Coordination is intact.  Psychiatric:        Attention and Perception: Attention and perception normal.        Mood and Affect: Mood and affect normal.        Speech: Speech normal.        Behavior: Behavior is cooperative.        Thought Content: Thought content normal.        Cognition and Memory: Cognition and memory normal.        Judgment: Judgment normal.       02/10/2022    3:00 PM 02/10/2022    2:59 PM 03/09/2021    4:21 PM  Depression screen PHQ 2/9  Decreased Interest  0 0   Down, Depressed, Hopeless  0 0  PHQ - 2 Score  0 0  Altered sleeping 1    Tired, decreased energy 1    Change in appetite 0    Feeling bad or failure about yourself  0    Trouble concentrating 1    Moving slowly or fidgety/restless 0    Suicidal thoughts 0    Difficult doing work/chores Not difficult at all        06/21/2022    1:21 PM 02/10/2022    2:59 PM 01/11/2021    2:45 PM 09/03/2020   10:13 AM  GAD 7 : Generalized Anxiety Score  Nervous, Anxious, on Edge 0 0 1 0  Control/stop worrying 0 0 0 0  Worry too much - different things 0 0 1 0  Trouble relaxing 0 1 1 0  Restless 0 0 0 0  Easily annoyed or irritable 0 1 1 1   Afraid - awful might happen 0 0 0 0  Total GAD 7 Score 0 2 4 1   Anxiety Difficulty Not difficult at all Not difficult at all Not difficult at all Not difficult at all   Assessment & Plan:  1. Acute cough Instructed patient to continue supportive therapy and to complete the antibiotics that he self-started. Will obtain chest xray as below, will communicate results to patient once available and determine next steps based on results.  - DG Chest 2 View; Future  2. Congestion of nasal sinus Instructed patient to continue supportive therapy and to complete the antibiotics that he self-started.   The above assessment and management plan was discussed with the patient. The patient verbalized understanding of and has agreed to the management plan using shared-decision making. Patient is aware to call the clinic if they develop any new symptoms or if symptoms fail to improve or worsen. Patient is aware when to return to the clinic for a follow-up visit. Patient educated on when it is appropriate to go to the emergency department.   Return if symptoms worsen or fail to improve.  Neale Burly, DNP-FNP [facility] [address] [place], [place] [address] [phone]

## 2022-06-22 ENCOUNTER — Ambulatory Visit: Payer: BC Managed Care – PPO | Admitting: Family Medicine

## 2022-06-27 ENCOUNTER — Telehealth: Payer: Self-pay | Admitting: Family Medicine

## 2022-06-27 NOTE — Telephone Encounter (Signed)
Patient aware not back yet 

## 2022-06-27 NOTE — Telephone Encounter (Signed)
Patient calling to get results of x ray from 6/12 - please review and call back.

## 2022-08-07 ENCOUNTER — Encounter: Payer: Self-pay | Admitting: Family Medicine

## 2022-08-07 DIAGNOSIS — E559 Vitamin D deficiency, unspecified: Secondary | ICD-10-CM

## 2022-08-07 DIAGNOSIS — E1169 Type 2 diabetes mellitus with other specified complication: Secondary | ICD-10-CM

## 2022-08-07 DIAGNOSIS — N400 Enlarged prostate without lower urinary tract symptoms: Secondary | ICD-10-CM

## 2022-08-10 ENCOUNTER — Other Ambulatory Visit: Payer: BC Managed Care – PPO

## 2022-08-10 DIAGNOSIS — E1169 Type 2 diabetes mellitus with other specified complication: Secondary | ICD-10-CM

## 2022-08-10 DIAGNOSIS — N400 Enlarged prostate without lower urinary tract symptoms: Secondary | ICD-10-CM

## 2022-08-10 DIAGNOSIS — E559 Vitamin D deficiency, unspecified: Secondary | ICD-10-CM

## 2022-08-10 LAB — LIPID PANEL
Chol/HDL Ratio: 4 ratio (ref 0.0–5.0)
Cholesterol, Total: 145 mg/dL (ref 100–199)
HDL: 36 mg/dL — ABNORMAL LOW (ref >39)
LDL Chol Calc (NIH): 81 mg/dL (ref 0–99)
Triglycerides: 160 mg/dL — ABNORMAL HIGH (ref 0–149)
VLDL Cholesterol Cal: 28 mg/dL (ref 5–40)

## 2022-08-10 LAB — CMP14+EGFR
ALT: 35 IU/L (ref 0–44)
AST: 29 IU/L (ref 0–40)
Albumin: 4.4 g/dL (ref 3.9–4.9)
Alkaline Phosphatase: 93 IU/L (ref 44–121)
BUN/Creatinine Ratio: 9 — ABNORMAL LOW (ref 10–24)
BUN: 9 mg/dL (ref 8–27)
Bilirubin Total: 0.8 mg/dL (ref 0.0–1.2)
CO2: 23 mmol/L (ref 20–29)
Calcium: 9.4 mg/dL (ref 8.6–10.2)
Chloride: 102 mmol/L (ref 96–106)
Creatinine, Ser: 0.95 mg/dL (ref 0.76–1.27)
Globulin, Total: 2.3 g/dL (ref 1.5–4.5)
Glucose: 107 mg/dL — ABNORMAL HIGH (ref 70–99)
Potassium: 4.6 mmol/L (ref 3.5–5.2)
Sodium: 137 mmol/L (ref 134–144)
Total Protein: 6.7 g/dL (ref 6.0–8.5)
eGFR: 90 mL/min/{1.73_m2} (ref >59)

## 2022-08-10 LAB — HIV ANTIBODY (ROUTINE TESTING W REFLEX)

## 2022-08-10 LAB — VITAMIN B12

## 2022-08-10 LAB — VITAMIN D 25 HYDROXY (VIT D DEFICIENCY, FRACTURES)

## 2022-08-10 LAB — BAYER DCA HB A1C WAIVED: HB A1C (BAYER DCA - WAIVED): 5.8 % — ABNORMAL HIGH (ref 4.8–5.6)

## 2022-08-14 ENCOUNTER — Encounter: Payer: Self-pay | Admitting: Family Medicine

## 2022-08-14 ENCOUNTER — Telehealth: Payer: BC Managed Care – PPO | Admitting: Family Medicine

## 2022-08-14 DIAGNOSIS — U071 COVID-19: Secondary | ICD-10-CM

## 2022-08-14 DIAGNOSIS — K219 Gastro-esophageal reflux disease without esophagitis: Secondary | ICD-10-CM | POA: Diagnosis not present

## 2022-08-14 DIAGNOSIS — E781 Pure hyperglyceridemia: Secondary | ICD-10-CM | POA: Diagnosis not present

## 2022-08-14 DIAGNOSIS — E1169 Type 2 diabetes mellitus with other specified complication: Secondary | ICD-10-CM | POA: Insufficient documentation

## 2022-08-14 DIAGNOSIS — Z7984 Long term (current) use of oral hypoglycemic drugs: Secondary | ICD-10-CM

## 2022-08-14 MED ORDER — MOLNUPIRAVIR EUA 200MG CAPSULE
4.0000 | ORAL_CAPSULE | Freq: Two times a day (BID) | ORAL | 0 refills | Status: AC
Start: 2022-08-14 — End: 2022-08-19

## 2022-08-14 MED ORDER — FLUTICASONE PROPIONATE 50 MCG/ACT NA SUSP: 1.0000 | Freq: Two times a day (BID) | NASAL | 1 refills | Status: DC | PRN

## 2022-08-14 NOTE — Progress Notes (Signed)
Virtual Visit via MyChart video note  I connected with Jimmy Mathis on 08/14/22 at 1539 by video and verified that I am speaking with the correct person using two identifiers. Jimmy Mathis is currently located at home and patient are currently with her during visit. The provider, Elige Radon Roshell Brigham, MD is located in their office at time of visit.  Call ended at 1555  I discussed the limitations, risks, security and privacy concerns of performing an evaluation and management service by video and the availability of in person appointments. I also discussed with the patient that there may be a patient responsible charge related to this service. The patient expressed understanding and agreed to proceed.   History and Present Illness: Patient is calling in for cough and scratchy throat.  He tested positive for covid today.  He has aches and chills. He is having bodyaches and a lot of congestion and sinus pressure as well.  He is using Mucinex some but wanted to ask about it before he really started using it.  Type 2 diabetes mellitus Patient comes in today for recheck of his diabetes. Patient has been currently taking metformin. Patient is not currently on an ACE inhibitor/ARB. Patient has not seen an ophthalmologist this year. Patient denies any new issues with their feet. The symptom started onset as an adult hypertriglyceridemia ARE RELATED TO DM   Hypertriglyceridemia Patient is coming in for recheck of his hypertriglyceridemia. The patient is currently taking no medicine, focusing on diet. They deny any issues with myalgias or history of liver damage from it. They deny any focal numbness or weakness or chest pain.   1. COVID-19 virus infection   2. Type 2 diabetes mellitus with other specified complication, without long-term current use of insulin (HCC)   3. Gastroesophageal reflux disease without esophagitis   4. Hypertriglyceridemia     Outpatient Encounter Medications as of  08/14/2022  Medication Sig   fluticasone (FLONASE) 50 MCG/ACT nasal spray Place 1 spray into both nostrils 2 (two) times daily as needed for allergies or rhinitis.   molnupiravir EUA (LAGEVRIO) 200 mg CAPS capsule Take 4 capsules (800 mg total) by mouth 2 (two) times daily for 5 days.   Cholecalciferol (VITAMIN D) 2000 units CAPS Take 2,000 Units by mouth daily.    Continuous Blood Gluc Sensor (FREESTYLE LIBRE 3 SENSOR) MISC 1 each by Does not apply route every 14 (fourteen) days. Place 1 sensor on the skin every 14 days. Use to check glucose continuously   guaiFENesin (MUCINEX) 600 MG 12 hr tablet Take by mouth 2 (two) times daily.   metFORMIN (GLUCOPHAGE) 500 MG tablet Take 1 tablet (500 mg total) by mouth 2 (two) times daily with a meal.   Facility-Administered Encounter Medications as of 08/14/2022  Medication   betamethasone acetate-betamethasone sodium phosphate (CELESTONE) injection 6 mg    Review of Systems  Constitutional:  Negative for chills and fever.  HENT:  Positive for congestion, postnasal drip, rhinorrhea and sinus pressure. Negative for ear discharge, ear pain, sneezing, sore throat and voice change.   Eyes:  Negative for pain, discharge, redness and visual disturbance.  Respiratory:  Positive for cough. Negative for shortness of breath and wheezing.   Cardiovascular:  Negative for chest pain and leg swelling.  Musculoskeletal:  Negative for back pain and gait problem.  Skin:  Negative for rash.  All other systems reviewed and are negative.   Observations/Objective: Patient sounds comfortable and in no acute distress  Assessment  and Plan: Problem List Items Addressed This Visit       Digestive   GERD     Endocrine   Type 2 diabetes mellitus with other specified complication (HCC)     Other   Hypertriglyceridemia   Other Visit Diagnoses     COVID-19 virus infection    -  Primary   Relevant Medications   molnupiravir EUA (LAGEVRIO) 200 mg CAPS capsule    fluticasone (FLONASE) 50 MCG/ACT nasal spray       Will treat for COVID based on him being diabetic  A1c and blood work all looked pretty good, triglycerides have greatly improved with diet  No changes in medication for now except for the COVID treatment. Follow up plan: Return in about 3 months (around 11/14/2022), or if symptoms worsen or fail to improve, for diabetes.     I discussed the assessment and treatment plan with the patient. The patient was provided an opportunity to ask questions and all were answered. The patient agreed with the plan and demonstrated an understanding of the instructions.   The patient was advised to call back or seek an in-person evaluation if the symptoms worsen or if the condition fails to improve as anticipated.  The above assessment and management plan was discussed with the patient. The patient verbalized understanding of and has agreed to the management plan. Patient is aware to call the clinic if symptoms persist or worsen. Patient is aware when to return to the clinic for a follow-up visit. Patient educated on when it is appropriate to go to the emergency department.    I provided 16 minutes of non-face-to-face time during this encounter.    Nils Pyle, MD

## 2022-09-05 ENCOUNTER — Other Ambulatory Visit: Payer: Self-pay | Admitting: Family Medicine

## 2022-09-05 DIAGNOSIS — U071 COVID-19: Secondary | ICD-10-CM

## 2022-09-29 ENCOUNTER — Encounter: Payer: Self-pay | Admitting: Family Medicine

## 2022-09-29 MED ORDER — FREESTYLE LIBRE 3 PLUS SENSOR MISC: 3 refills | Status: DC

## 2022-10-18 IMAGING — DX DG CHEST 2V
2 series · 2 of 2 positions shown · non-contrast
Comparison: June 24, 2021.

CLINICAL DATA: Pneumonia.

EXAM:
CHEST - 2 VIEW

[chest pa]
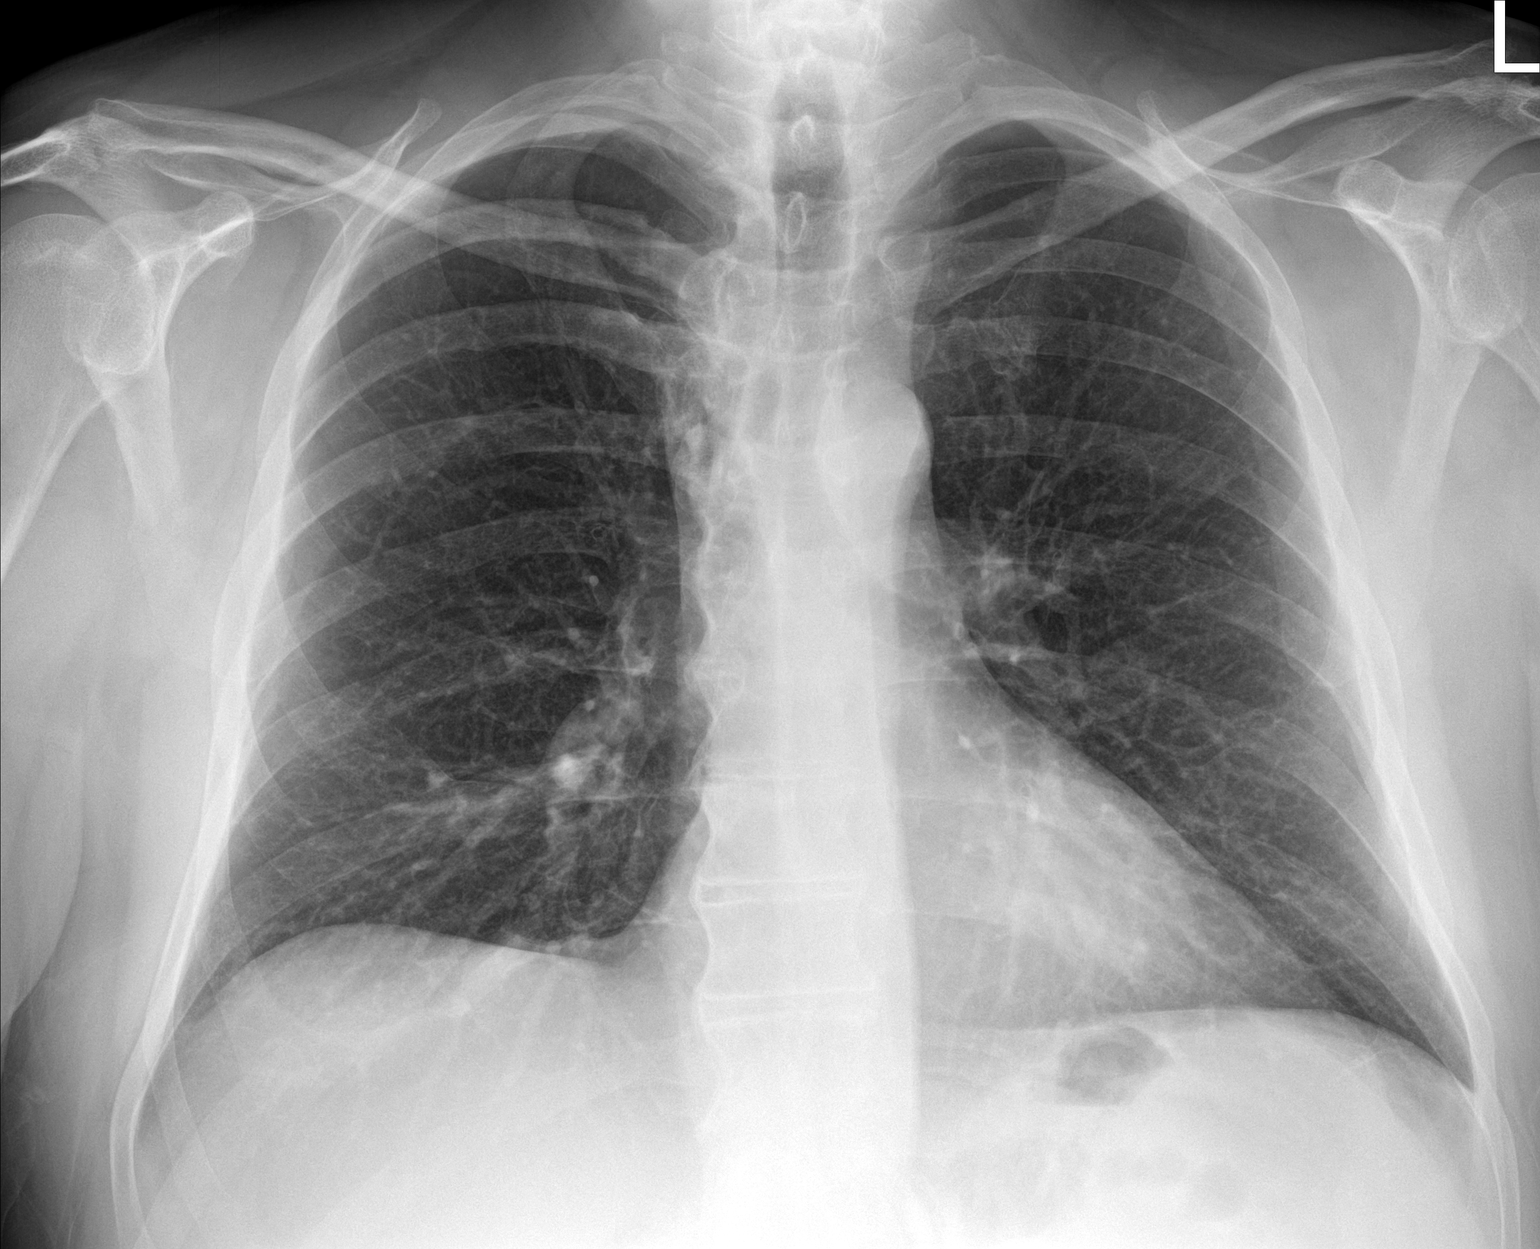

[chest lat]
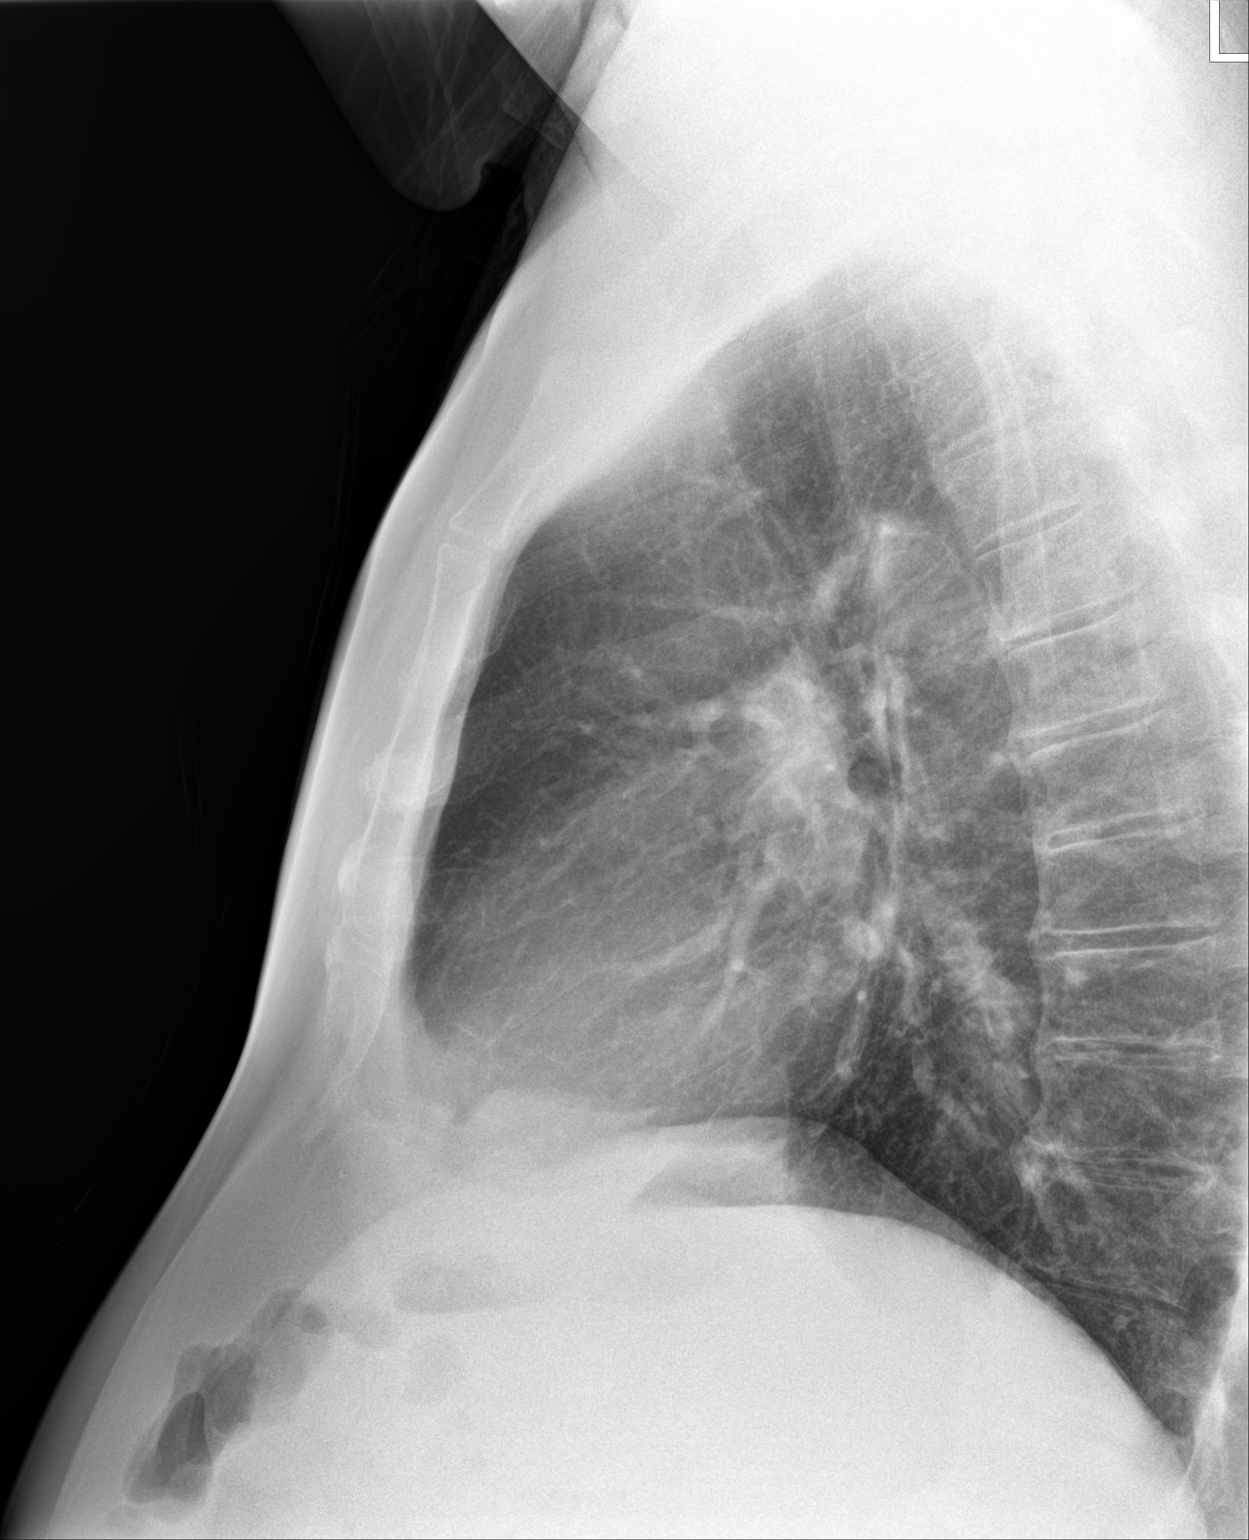

[2 of 2 positions shown; findings below may reference images not displayed]

FINDINGS: The heart size and mediastinal contours are within normal limits.
Both lungs are clear. The visualized skeletal structures are
unremarkable.
IMPRESSION: No active cardiopulmonary disease.

## 2022-11-24 ENCOUNTER — Encounter: Payer: Self-pay | Admitting: Family Medicine

## 2022-11-24 ENCOUNTER — Ambulatory Visit: Payer: BC Managed Care – PPO | Admitting: Family Medicine

## 2022-11-24 VITALS — BP 118/73 | HR 88 | Ht 69.0 in | Wt 216.0 lb

## 2022-11-24 DIAGNOSIS — Z23 Encounter for immunization: Secondary | ICD-10-CM

## 2022-11-24 DIAGNOSIS — E1169 Type 2 diabetes mellitus with other specified complication: Secondary | ICD-10-CM

## 2022-11-24 DIAGNOSIS — N400 Enlarged prostate without lower urinary tract symptoms: Secondary | ICD-10-CM

## 2022-11-24 DIAGNOSIS — E66811 Obesity, class 1: Secondary | ICD-10-CM

## 2022-11-24 DIAGNOSIS — Z6833 Body mass index (BMI) 33.0-33.9, adult: Secondary | ICD-10-CM

## 2022-11-24 DIAGNOSIS — E781 Pure hyperglyceridemia: Secondary | ICD-10-CM

## 2022-11-24 DIAGNOSIS — M545 Low back pain, unspecified: Secondary | ICD-10-CM

## 2022-11-24 DIAGNOSIS — Z7984 Long term (current) use of oral hypoglycemic drugs: Secondary | ICD-10-CM

## 2022-11-24 LAB — BAYER DCA HB A1C WAIVED: HB A1C (BAYER DCA - WAIVED): 5.9 % — ABNORMAL HIGH (ref 4.8–5.6)

## 2022-11-24 MED ORDER — METFORMIN HCL 500 MG PO TABS
500.0000 mg | ORAL_TABLET | Freq: Two times a day (BID) | ORAL | 3 refills | Status: DC
Start: 2022-11-24 — End: 2023-09-13

## 2022-11-24 NOTE — Progress Notes (Signed)
BP 118/73   Pulse 88   Ht 5\' 9"  (1.753 m)   Wt 216 lb (98 kg)   SpO2 98%   BMI 31.90 kg/m    Subjective:   Patient ID: Jimmy Mathis, male    DOB: Jan 12, 1960, 62 y.o.   MRN: [id number]  HPI: HILLARD YERKE is a 62 y.o. male presenting on 11/24/2022 for Medical Management of Chronic Issues and Diabetes   HPI Type 2 diabetes mellitus Patient comes in today for recheck of his diabetes. Patient has been currently taking metformin. Patient is not currently on an ACE inhibitor/ARB. Patient has seen an ophthalmologist this year. Patient denies any new issues with their feet. The symptom started onset as an adult hyperlipidemia ARE RELATED TO DM   Hyperlipidemia Patient is coming in for recheck of his hyperlipidemia. The patient is currently taking no medicine currently, diet control. They deny any issues with myalgias or history of liver damage from it. They deny any focal numbness or weakness or chest pain.   Low back pain Patient recently had a fall last week where he fell onto his buttocks and lower back and has been having some pain on his buttocks on the right lower side and his back that is been bothering him and irritating him.  He has been using some Tylenol and it just does not seem to be fully getting better.  He feels like he is very tight and tight up his back.  He wants to discuss possible physical therapy or stretches.  We will try the stretches and exercises first but if does not improve we can go to physical therapy.  He denies any pain radiating down his legs or numbness or weakness.  Relevant past medical, surgical, family and social history reviewed and updated as indicated. Interim medical history since our last visit reviewed. Allergies and medications reviewed and updated.  Review of Systems  Constitutional:  Negative for chills and fever.  Eyes:  Negative for discharge.  Respiratory:  Negative for shortness of breath and wheezing.   Cardiovascular:  Negative for  chest pain and leg swelling.  Musculoskeletal:  Positive for back pain and myalgias. Negative for arthralgias and gait problem.  Skin:  Negative for rash.  All other systems reviewed and are negative.   Per HPI unless specifically indicated above   Allergies as of 11/24/2022       Reactions   Penicillins Nausea And Vomiting, Swelling   High fever Has patient had a PCN reaction causing immediate rash, facial/tongue/throat swelling, SOB or lightheadedness with hypotension: No Has patient had a PCN reaction causing severe rash involving mucus membranes or skin necrosis: No Has patient had a PCN reaction that required hospitalization: No Has patient had a PCN reaction occurring within the last 10 years: No If all of the above answers are "NO", then may proceed with Cephalosporin use.   Procaine Hcl Swelling   Novocaine   Levaquin [levofloxacin] Other (See Comments)   Caused pain in tendons   Minocycline Other (See Comments)   Severe headache   Tetracyclines & Related    Headache        Medication List        Accurate as of November 24, 2022  4:35 PM. If you have any questions, ask your nurse or doctor.          cyanocobalamin 500 MCG tablet Commonly known as: VITAMIN B12 Take 500 mcg by mouth daily.   fluticasone 50 MCG/ACT nasal  spray Commonly known as: FLONASE PLACE 1 SPRAY INTO BOTH NOSTRILS 2 (TWO) TIMES DAILY AS NEEDED FOR ALLERGIES OR RHINITIS.   FreeStyle Libre 3 Plus Sensor Misc Change sensor every 15 days.   guaiFENesin 600 MG 12 hr tablet Commonly known as: MUCINEX Take by mouth 2 (two) times daily.   magnesium gluconate 500 MG tablet Commonly known as: MAGONATE Take 500 mg by mouth daily.   metFORMIN 500 MG tablet Commonly known as: GLUCOPHAGE Take 1 tablet (500 mg total) by mouth 2 (two) times daily with a meal.   Vitamin D 50 MCG (2000 UT) Caps Take 2,000 Units by mouth daily.   zinc gluconate 50 MG tablet Take 50 mg by mouth daily.          Objective:   BP 118/73   Pulse 88   Ht 5\' 9"  (1.753 m)   Wt 216 lb (98 kg)   SpO2 98%   BMI 31.90 kg/m   Wt Readings from Last 3 Encounters:  11/24/22 216 lb (98 kg)  06/21/22 214 lb (97.1 kg)  05/12/22 218 lb (98.9 kg)    Physical Exam Vitals and nursing note reviewed.  Constitutional:      General: He is not in acute distress.    Appearance: He is well-developed. He is not diaphoretic.  Eyes:     General: No scleral icterus.       Right eye: No discharge.     Conjunctiva/sclera: Conjunctivae normal.     Pupils: Pupils are equal, round, and reactive to light.  Neck:     Thyroid: No thyromegaly.  Cardiovascular:     Rate and Rhythm: Normal rate and regular rhythm.     Heart sounds: Normal heart sounds. No murmur heard. Pulmonary:     Effort: Pulmonary effort is normal. No respiratory distress.     Breath sounds: Normal breath sounds. No wheezing.  Musculoskeletal:        General: Normal range of motion.     Cervical back: Neck supple.     Lumbar back: Tenderness present. No bony tenderness. Normal range of motion. Negative right straight leg raise test and negative left straight leg raise test. No scoliosis.       Back:  Lymphadenopathy:     Cervical: No cervical adenopathy.  Skin:    General: Skin is warm and dry.     Findings: No rash.  Neurological:     Mental Status: He is alert and oriented to person, place, and time.     Coordination: Coordination normal.  Psychiatric:        Behavior: Behavior normal.       Assessment & Plan:   Problem List Items Addressed This Visit       Endocrine   Type 2 diabetes mellitus with other specified complication (HCC) - Primary   Relevant Medications   metFORMIN (GLUCOPHAGE) 500 MG tablet   Other Relevant Orders   Bayer DCA Hb A1c Waived     Genitourinary   BPH (benign prostatic hyperplasia)     Other   Hypertriglyceridemia   Obesity   Relevant Medications   metFORMIN (GLUCOPHAGE) 500 MG tablet    Other Visit Diagnoses     Acute right-sided low back pain without sciatica           A1c looks good at 5.9.  No changes  Gave a list of exercises for his lower back and recommended heating pad and ibuprofen. Follow up plan: Return in about 3 months (  around 02/24/2023), or if symptoms worsen or fail to improve, for Diabetes.  Counseling provided for all of the vaccine components Orders Placed This Encounter  Procedures   Bayer DCA Hb A1c Waived    Arville Care, MD [facility] 11/24/2022, 4:35 PM

## 2022-11-24 NOTE — Addendum Note (Signed)
Addended by: Dorene Sorrow on: 11/24/2022 04:44 PM   Modules accepted: Orders

## 2022-11-25 LAB — MICROALBUMIN / CREATININE URINE RATIO
Creatinine, Urine: 92.6 mg/dL
Microalb/Creat Ratio: <3 mg/g{creat} (ref 0–29)
Microalbumin, Urine: <3 ug/mL

## 2022-12-11 ENCOUNTER — Ambulatory Visit: Payer: BC Managed Care – PPO | Admitting: Family Medicine

## 2023-01-15 ENCOUNTER — Ambulatory Visit: Payer: 59 | Admitting: Family Medicine

## 2023-01-15 ENCOUNTER — Ambulatory Visit: Payer: Self-pay | Admitting: Family Medicine

## 2023-01-15 ENCOUNTER — Encounter: Payer: Self-pay | Admitting: Family Medicine

## 2023-01-15 ENCOUNTER — Ambulatory Visit: Payer: 59

## 2023-01-15 VITALS — BP 156/75 | HR 86 | Ht 69.0 in | Wt 218.0 lb

## 2023-01-15 DIAGNOSIS — M5442 Lumbago with sciatica, left side: Secondary | ICD-10-CM | POA: Diagnosis not present

## 2023-01-15 MED ORDER — METHYLPREDNISOLONE ACETATE 80 MG/ML IJ SUSP
80.0000 mg | Freq: Once | INTRAMUSCULAR | Status: AC
  Administered 2023-01-15: 80 mg via INTRA_ARTICULAR

## 2023-01-15 MED ORDER — CYCLOBENZAPRINE HCL 10 MG PO TABS: 10.0000 mg | ORAL_TABLET | Freq: Three times a day (TID) | ORAL | 0 refills | Status: DC | PRN

## 2023-01-15 MED ORDER — PREDNISONE 20 MG PO TABS: ORAL_TABLET | ORAL | 0 refills | Status: DC

## 2023-01-15 NOTE — Progress Notes (Addendum)
 BP (!) 156/75   Pulse 86   Ht 5' 9 (1.753 m)   Wt 218 lb (98.9 kg)   SpO2 97%   BMI 32.19 kg/m    Subjective:   Patient ID: Jimmy Mathis, male    DOB: [birth year], 62 y.o.   MRN: [id number]  HPI: Jimmy Mathis is a 63 y.o. male presenting on 01/15/2023 for Back Pain (Lower. Radiates down left leg)   HPI Patient is coming in today for low back pain that is been bothering him for about a month mildly and it was more on the right side but then over the past 2 days for what ever reason he has gotten this sharp and more severe pain on his left lower back and causing pain to shoot down to the calf on his left leg.  He has never had pain like this before.  He denies any specific trauma or fall or anything recently that could have caused this to be worse.  He says sitting and laying down hurts worse and standing up is better so most of the time he is either propped himself up and stand up most of the time.  He has been taking Tylenol  and ibuprofen  around-the-clock and they do help some.  Relevant past medical, surgical, family and social history reviewed and updated as indicated. Interim medical history since our last visit reviewed. Allergies and medications reviewed and updated.  Review of Systems  Constitutional:  Negative for chills and fever.  Eyes:  Negative for visual disturbance.  Respiratory:  Negative for shortness of breath and wheezing.   Cardiovascular:  Negative for chest pain and leg swelling.  Musculoskeletal:  Positive for arthralgias, back pain and myalgias. Negative for gait problem.  Skin:  Negative for rash.  All other systems reviewed and are negative.   Per HPI unless specifically indicated above   Allergies as of 01/15/2023       Reactions   Penicillins Nausea And Vomiting, Swelling   High fever Has patient had a PCN reaction causing immediate rash, facial/tongue/throat swelling, SOB or lightheadedness with hypotension: No Has patient had a PCN reaction  causing severe rash involving mucus membranes or skin necrosis: No Has patient had a PCN reaction that required hospitalization: No Has patient had a PCN reaction occurring within the last 10 years: No If all of the above answers are NO, then may proceed with Cephalosporin use.   Procaine Hcl Swelling   Novocaine   Levaquin  [levofloxacin ] Other (See Comments)   Caused pain in tendons   Minocycline  Other (See Comments)   Severe headache   Tetracyclines & Related    Headache        Medication List        Accurate as of January 15, 2023 11:11 AM. If you have any questions, ask your nurse or doctor.          cyanocobalamin 500 MCG tablet Commonly known as: VITAMIN B12 Take 500 mcg by mouth daily.   cyclobenzaprine  10 MG tablet Commonly known as: FLEXERIL  Take 1 tablet (10 mg total) by mouth 3 (three) times daily as needed for muscle spasms. Started by: Sarafina Puthoff A Marcella Charlson   fluticasone  50 MCG/ACT nasal spray Commonly known as: FLONASE  PLACE 1 SPRAY INTO BOTH NOSTRILS 2 (TWO) TIMES DAILY AS NEEDED FOR ALLERGIES OR RHINITIS.   FreeStyle Libre 3 Plus Sensor Misc Change sensor every 15 days.   guaiFENesin  600 MG 12 hr tablet Commonly known as: MUCINEX  Take by  mouth 2 (two) times daily.   magnesium gluconate 500 MG tablet Commonly known as: MAGONATE Take 500 mg by mouth daily.   metFORMIN  500 MG tablet Commonly known as: GLUCOPHAGE  Take 1 tablet (500 mg total) by mouth 2 (two) times daily with a meal.   predniSONE  20 MG tablet Commonly known as: DELTASONE  2 po at same time daily for 5 days Started by: Fonda LABOR Sayan Aldava   Vitamin D  50 MCG (2000 UT) Caps Take 2,000 Units by mouth daily.   zinc gluconate 50 MG tablet Take 50 mg by mouth daily.         Objective:   BP (!) 156/75   Pulse 86   Ht 5' 9 (1.753 m)   Wt 218 lb (98.9 kg)   SpO2 97%   BMI 32.19 kg/m   Wt Readings from Last 3 Encounters:  01/15/23 218 lb (98.9 kg)  11/24/22 216 lb (98 kg)   06/21/22 214 lb (97.1 kg)    Physical Exam Vitals and nursing note reviewed.  Constitutional:      General: He is not in acute distress.    Appearance: He is well-developed. He is not diaphoretic.  Eyes:     General: No scleral icterus.    Conjunctiva/sclera: Conjunctivae normal.  Neck:     Thyroid: No thyromegaly.  Musculoskeletal:     Lumbar back: Tenderness present. No deformity or bony tenderness. Normal range of motion. Positive left straight leg raise test. Negative right straight leg raise test.       Back:  Skin:    General: Skin is warm and dry.     Findings: No rash.  Neurological:     Mental Status: He is alert and oriented to person, place, and time.     Coordination: Coordination normal.  Psychiatric:        Behavior: Behavior normal.       Assessment & Plan:   Problem List Items Addressed This Visit   None Visit Diagnoses       Acute bilateral low back pain with left-sided sciatica    -  Primary   Relevant Medications   methylPREDNISolone  acetate (DEPO-MEDROL ) injection 80 mg (Start on 01/15/2023 11:15 AM)   predniSONE  (DELTASONE ) 20 MG tablet   cyclobenzaprine  (FLEXERIL ) 10 MG tablet       Likely muscular strain compressing sciatic nerve, will use anti-inflammatories to see if we can calm it down and can also use muscle relaxers. If not improving then call back  Intramuscular Depo-Medrol  80 mg injection  Follow up plan: Return if symptoms worsen or fail to improve.  Counseling provided for all of the vaccine components No orders of the defined types were placed in this encounter.   Fonda Levins, MD [facility] 01/15/2023, 11:11 AM

## 2023-01-25 ENCOUNTER — Ambulatory Visit: Payer: 59 | Admitting: Nurse Practitioner

## 2023-01-25 ENCOUNTER — Encounter: Payer: Self-pay | Admitting: Nurse Practitioner

## 2023-01-25 VITALS — BP 133/80 | HR 108 | Temp 97.4°F | Ht 69.0 in | Wt 219.2 lb

## 2023-01-25 DIAGNOSIS — J0110 Acute frontal sinusitis, unspecified: Secondary | ICD-10-CM | POA: Insufficient documentation

## 2023-01-25 DIAGNOSIS — R051 Acute cough: Secondary | ICD-10-CM | POA: Diagnosis not present

## 2023-01-25 MED ORDER — AZITHROMYCIN 250 MG PO TABS: ORAL_TABLET | ORAL | 0 refills | Status: DC

## 2023-01-25 MED ORDER — METHYLPREDNISOLONE 4 MG PO TBPK: ORAL_TABLET | ORAL | 0 refills | Status: DC

## 2023-01-25 NOTE — Progress Notes (Signed)
Acute Office Visit  Subjective:     Patient ID: Jimmy Mathis, male    DOB: 07-05-1960, 63 y.o.   MRN: [id number]  Chief Complaint  Patient presents with   Cough    Cough for 6 days, at home covid test negative    Jimmy Mathis is 63 yrs old male present 01/25/2023 concerns for cough a 6-days cough, not relieve with OTC medications. Cough: Patient complains of headache  , nasal congestion, and productive cough with sputum described as green.  Symptoms began 6 days ago.  The cough is productive of green/yellow sputum and is aggravated by nothing Associated symptoms include:night sweats, postnasal drip, and sputum production. Patient does have pets. Patient does not have a history of asthma. Patient does not have a history of environmental allergens. Patient does not have recent travel. Patient does not have a history of smoking. Patient  does not have previous Chest X-ray.    Active Ambulatory Problems    Diagnosis Date Noted   GERD 04/04/2009   Elevated prostate specific antigen (PSA) 08/13/2013   Type 2 diabetes mellitus with other specified complication (HCC) 04/29/2015   Family history of colon cancer 07/30/2015   Hypersomnia 10/06/2015   Obesity 10/06/2015   OSA (obstructive sleep apnea) 02/04/2016   Vitamin D deficiency 08/03/2016   BPH (benign prostatic hyperplasia) 02/06/2018   Peyronie's disease 10/21/2018   Erectile dysfunction 12/06/2020   Hypertriglyceridemia 08/14/2022   Acute non-recurrent frontal sinusitis 01/25/2023   Resolved Ambulatory Problems    Diagnosis Date Noted   ABDOMINAL PAIN OTHER SPECIFIED SITE 04/04/2009   Abnormal CAT scan 10/30/2012   Angio-edema 11/12/2012   Foreign body in lung 11/12/2012   Annual physical exam 07/19/2015   Anxiety state 07/29/2015   Palpitations 07/29/2015   Difficulty sleeping 07/29/2015   Snoring 07/29/2015   Type 2 diabetes mellitus (HCC) 01/19/2016   Cherry angioma 07/22/2015   Drug allergy 11/12/2012   Lentigo  07/22/2015   Multiple benign nevi 07/22/2015   Penicillin adverse reaction 11/12/2012   Sun-damaged skin 07/22/2015   Seborrheic keratosis 07/22/2015   Sebaceous hyperplasia 07/22/2015   Past Medical History:  Diagnosis Date   Anxiety    Blood transfusion without reported diagnosis    Diabetes mellitus without complication (HCC)    Sleep apnea      Review of Systems  Constitutional:  Negative for chills and fever.  HENT:  Positive for congestion and sinus pain. Negative for ear pain and sore throat.   Eyes:  Negative for pain.  Respiratory:  Positive for cough and sputum production. Negative for shortness of breath and wheezing.        Green sputum  Cardiovascular:  Negative for chest pain and leg swelling.  Gastrointestinal:  Negative for constipation, diarrhea, nausea and vomiting.  Musculoskeletal:  Negative for myalgias.  Skin:  Negative for itching and rash.  Neurological:  Negative for dizziness and headaches.   Negative unless indicated in HPI Had 2 COVID test at home and was negative    Objective:    BP 133/80   Pulse (!) 108   Temp (!) 97.4 F (36.3 C) (Temporal)   Ht 5\' 9"  (1.753 m)   Wt 219 lb 3.2 oz (99.4 kg)   SpO2 96%   BMI 32.37 kg/m  BP Readings from Last 3 Encounters:  01/25/23 133/80  01/15/23 (!) 156/75  11/24/22 118/73   Wt Readings from Last 3 Encounters:  01/25/23 219 lb 3.2 oz (99.4 kg)  01/15/23 218 lb (98.9 kg)  11/24/22 216 lb (98 kg)      Physical Exam Vitals and nursing note reviewed.  Constitutional:      General: He is not in acute distress.    Appearance: He is obese.  HENT:     Head: Normocephalic and atraumatic.     Right Ear: Tympanic membrane, ear canal and external ear normal.     Left Ear: Tympanic membrane, ear canal and external ear normal.     Nose: Congestion present.  Eyes:     General: No scleral icterus.    Extraocular Movements: Extraocular movements intact.     Conjunctiva/sclera: Conjunctivae normal.      Pupils: Pupils are equal, round, and reactive to light.  Cardiovascular:     Rate and Rhythm: Normal rate and regular rhythm.  Pulmonary:     Effort: Pulmonary effort is normal.     Breath sounds: Normal breath sounds. No wheezing or rhonchi.  Musculoskeletal:     Right lower leg: No edema.     Left lower leg: No edema.  Skin:    General: Skin is warm and dry.     Findings: No rash.  Neurological:     Mental Status: He is alert and oriented to person, place, and time. Mental status is at baseline.  Psychiatric:        Mood and Affect: Mood normal.        Behavior: Behavior normal.        Thought Content: Thought content normal.        Judgment: Judgment normal.     No results found for any visits on 01/25/23.      Assessment & Plan:  Acute non-recurrent frontal sinusitis -     Azithromycin; Take 2-tabs on the first day and 1-tab daily until done  Dispense: 6 each; Refill: 0  Acute cough  Other orders -     methylPREDNISolone; Follow instruction on the box  Dispense: 21 tablet; Refill: 0  Uber is 63 yr old Caucasian male seen today for sinusitis and cough, no acute distress, will treat with Z-Pak 6 tablets dispensed and Medrol Dosepak 4 mg #21 dispensed client to follow instruction on the boxes for both medication -Increase hydration, Tylenol/ibuprofen for fever, rest  Encourage healthy lifestyle choices, including diet (rich in fruits, vegetables, and lean proteins, and low in salt and simple carbohydrates) and exercise (at least 30 minutes of moderate physical activity daily).     The above assessment and management plan was discussed with the patient. The patient verbalized understanding of and has agreed to the management plan. Patient is aware to call the clinic if they develop any new symptoms or if symptoms persist or worsen. Patient is aware when to return to the clinic for a follow-up visit. Patient educated on when it is appropriate to go to the emergency  department.  Return if symptoms worsen or fail to improve.  Arrie Aran Santa Lighter, [facility] [address] [place], [place] [address] [phone] f   Note: This document was prepared by [employer] voice dictation technology and any errors that results from this process are unintentional.

## 2023-01-28 ENCOUNTER — Ambulatory Visit
Admission: RE | Admit: 2023-01-28 | Discharge: 2023-01-28 | Disposition: A | Discharge status: Home / Self Care | Payer: 59 | Source: Ambulatory Visit | Attending: Family Medicine | Admitting: Family Medicine

## 2023-01-28 VITALS — BP 156/87 | HR 94 | Temp 97.8°F | Resp 18

## 2023-01-28 DIAGNOSIS — K1121 Acute sialoadenitis: Secondary | ICD-10-CM | POA: Diagnosis not present

## 2023-01-28 DIAGNOSIS — J069 Acute upper respiratory infection, unspecified: Secondary | ICD-10-CM

## 2023-01-28 NOTE — Discharge Instructions (Signed)
Finish the Z-Pak and prednisone Lots of fluids Cool compresses Call for problems

## 2023-01-28 NOTE — ED Provider Notes (Signed)
[facility]    CSN: [id number] Arrival date & time: 01/28/23  1104      History   Chief Complaint Chief Complaint  Patient presents with   Oral Swelling    Salivary glands-swelling began Saturday, pain Friday - Entered by patient    HPI Jimmy Mathis is a 62 y.o. male.   HPI  Patient had a sinus infection and respiratory symptoms so was seen by his primary care doctor.  He was treated with a Z-Pak and steroids.  Since yesterday he has noticed that he has swelling underneath his chin and in the neck.  He feels it is a salivary glands.  When he looked in his mouth he did see a couple of blisters that he popped.  Does not have a history of cold sores or herpes.  Past Medical History:  Diagnosis Date   Anxiety    Blood transfusion without reported diagnosis    Diabetes mellitus without complication (HCC)    Sleep apnea     Patient Active Problem List   Diagnosis Date Noted   Acute non-recurrent frontal sinusitis 01/25/2023   Hypertriglyceridemia 08/14/2022   Erectile dysfunction 12/06/2020   Peyronie's disease 10/21/2018   BPH (benign prostatic hyperplasia) 02/06/2018   Vitamin D deficiency 08/03/2016   OSA (obstructive sleep apnea) 02/04/2016   Hypersomnia 10/06/2015   Obesity 10/06/2015   Family history of colon cancer 07/30/2015   Type 2 diabetes mellitus with other specified complication (HCC) 04/29/2015   Elevated prostate specific antigen (PSA) 08/13/2013   GERD 04/04/2009    Past Surgical History:  Procedure Laterality Date   CHOLECYSTECTOMY     COLONOSCOPY     COLONOSCOPY N/A 09/27/2016   Procedure: COLONOSCOPY;  Surgeon: Corbin Ade, MD;  Location: [facility];  Service: Endoscopy;  Laterality: N/A;  830    POLYPECTOMY  09/27/2016   Procedure: POLYPECTOMY;  Surgeon: Corbin Ade, MD;  Location: [facility];  Service: Endoscopy;;  colon   TENDON REPAIR     right hand   TONSILLECTOMY         Home Medications    Prior to  Admission medications   Medication Sig Start Date End Date Taking? Authorizing Provider  azithromycin (ZITHROMAX Z-PAK) 250 MG tablet Take 2-tabs on the first day and 1-tab daily until done 01/25/23   Martina Sinner, NP  Cholecalciferol (VITAMIN D) 2000 units CAPS Take 2,000 Units by mouth daily.     [provider]  Continuous Glucose Sensor (FREESTYLE LIBRE 3 PLUS SENSOR) MISC Change sensor every 15 days. 09/29/22   Dettinger, Elige Radon, MD  cyanocobalamin (VITAMIN B12) 500 MCG tablet Take 500 mcg by mouth daily.    [provider]  cyclobenzaprine (FLEXERIL) 10 MG tablet Take 1 tablet (10 mg total) by mouth 3 (three) times daily as needed for muscle spasms. 01/15/23   Dettinger, Elige Radon, MD  fluticasone (FLONASE) 50 MCG/ACT nasal spray PLACE 1 SPRAY INTO BOTH NOSTRILS 2 (TWO) TIMES DAILY AS NEEDED FOR ALLERGIES OR RHINITIS. 09/05/22   Dettinger, Elige Radon, MD  guaiFENesin (MUCINEX) 600 MG 12 hr tablet Take by mouth 2 (two) times daily.    [provider]  magnesium gluconate (MAGONATE) 500 MG tablet Take 500 mg by mouth daily.    [provider]  metFORMIN (GLUCOPHAGE) 500 MG tablet Take 1 tablet (500 mg total) by mouth 2 (two) times daily with a meal. 11/24/22   Dettinger, Elige Radon, MD  methylPREDNISolone (  MEDROL DOSEPAK) 4 MG TBPK tablet Follow instruction on the box 01/25/23   Martina Sinner, NP  zinc gluconate 50 MG tablet Take 50 mg by mouth daily.    [provider]    Family History Family History  Problem Relation Age of Onset   Cancer Father        lung   Diabetes Father    Colon cancer Other    Colon cancer Paternal Uncle    Colon cancer Maternal Grandfather    Diabetes Maternal Grandmother     Social History Social History   Tobacco Use   Smoking status: Former    Current packs/day: 1.50    Average packs/day: 1.5 packs/day for 12.0 years (18.0 ttl pk-yrs)    Types: Cigarettes   Smokeless tobacco: Never    Tobacco comments:    quit smoking at age 48  Vaping Use   Vaping status: Never Used  Substance Use Topics   Alcohol use: Yes    Comment: occasionally   Drug use: No     Allergies   Penicillins, Procaine hcl, Levaquin [levofloxacin], Minocycline, and Tetracyclines & related   Review of Systems Review of Systems  See HPI Physical Exam Triage Vital Signs ED Triage Vitals  Encounter Vitals Group     BP 01/28/23 1121 (!) 160/85     Systolic BP Percentile --      Diastolic BP Percentile --      Pulse Rate 01/28/23 1121 94     Resp 01/28/23 1121 18     Temp 01/28/23 1121 97.8 F (36.6 C)     Temp Source 01/28/23 1121 Oral     SpO2 01/28/23 1121 96 %     Weight --      Height --      Head Circumference --      Peak Flow --      Pain Score 01/28/23 1120 0     Pain Loc --      Pain Education --      Exclude from Growth Chart --    No data found.  Updated Vital Signs BP (!) 156/87 (BP Location: Left Arm)   Pulse 94   Temp 97.8 F (36.6 C) (Oral)   Resp 18   SpO2 96%       Physical Exam Constitutional:      General: He is not in acute distress.    Appearance: He is well-developed.  HENT:     Head: Normocephalic and atraumatic.     Right Ear: Tympanic membrane and ear canal normal.     Left Ear: Tympanic membrane and ear canal normal.     Nose: Nose normal. No congestion.     Mouth/Throat:     Pharynx: No posterior oropharyngeal erythema.  Eyes:     Conjunctiva/sclera: Conjunctivae normal.     Pupils: Pupils are equal, round, and reactive to light.  Neck:     Comments: Salivary glands submandibular and submental are swollen.  Neck is obviously swollen to visible inspection Cardiovascular:     Rate and Rhythm: Normal rate.  Pulmonary:     Effort: Pulmonary effort is normal. No respiratory distress.  Abdominal:     General: There is no distension.     Palpations: Abdomen is soft.  Musculoskeletal:        General: Normal range of motion.     Cervical  back: Normal range of motion.  Skin:    General: Skin is warm and  dry.  Neurological:     Mental Status: He is alert.      UC Treatments / Results  Labs (all labs ordered are listed, but only abnormal results are displayed) Labs Reviewed - No data to display  EKG   Radiology No results found.  Procedures Procedures (including critical care time)  Medications Ordered in UC Medications - No data to display  Initial Impression / Assessment and Plan / UC Course  I have reviewed the triage vital signs and the nursing notes.  Pertinent labs & imaging results that were available during my care of the patient were reviewed by me and considered in my medical decision making (see chart for details).     Patient has an acute upper respiratory infection.  He is being treated with a Z-Pak and prednisone.  He has developed bilateral sialoadenitis that I believe is likely viral.  I think it is related to the blisters he saw under his tongue.  I do not have any additional treatment to offer him for this.  Follow-up with his PCP if he fails to improve Final Clinical Impressions(s) / UC Diagnoses   Final diagnoses:  Acute sialoadenitis  Acute upper respiratory infection     Discharge Instructions      Finish the Z-Pak and prednisone Lots of fluids Cool compresses Call for problems    ED Prescriptions   None    PDMP not reviewed this encounter.   Eustace Moore, MD 01/28/23 [phone]

## 2023-01-28 NOTE — ED Triage Notes (Addendum)
Patient presents to [facility] for salivary gland swelling since yesterday and pain since Friday. States he has been seen by his PCP for sciatic pain and respiratory symptoms. Has been prescribed several medications so he is concerned that the swelling is possible allergic reaction. Taking ibuprofen for pain.   Denies SOB, throat or tongue swelling, or hives.

## 2023-02-28 ENCOUNTER — Encounter: Payer: Self-pay | Admitting: Family Medicine

## 2023-02-28 ENCOUNTER — Telehealth (INDEPENDENT_AMBULATORY_CARE_PROVIDER_SITE_OTHER): Payer: 59 | Admitting: Family Medicine

## 2023-02-28 DIAGNOSIS — E1169 Type 2 diabetes mellitus with other specified complication: Secondary | ICD-10-CM | POA: Diagnosis not present

## 2023-02-28 DIAGNOSIS — Z7984 Long term (current) use of oral hypoglycemic drugs: Secondary | ICD-10-CM

## 2023-02-28 DIAGNOSIS — E559 Vitamin D deficiency, unspecified: Secondary | ICD-10-CM | POA: Diagnosis not present

## 2023-02-28 DIAGNOSIS — N400 Enlarged prostate without lower urinary tract symptoms: Secondary | ICD-10-CM

## 2023-02-28 DIAGNOSIS — E781 Pure hyperglyceridemia: Secondary | ICD-10-CM | POA: Diagnosis not present

## 2023-02-28 DIAGNOSIS — R972 Elevated prostate specific antigen [PSA]: Secondary | ICD-10-CM

## 2023-02-28 NOTE — Progress Notes (Signed)
Virtual Visit via MyChart video note  I connected with Jimmy Mathis on 02/28/23 at [phone] by video and verified that I am speaking with the correct person using two identifiers. Jimmy Mathis is currently located at home and patient are currently with her during visit. The provider, Jimmy Radon Arbie Blankley, MD is located in their office at time of visit.  Call ended at [phone]  I discussed the limitations, risks, security and privacy concerns of performing an evaluation and management service by video and the availability of in person appointments. I also discussed with the patient that there may be a patient responsible charge related to this service. The patient expressed understanding and agreed to proceed.   History and Present Illness: Type 2 diabetes mellitus Patient comes in today for recheck of his diabetes. Patient has been currently taking metformin. Patient is not currently on an ACE inhibitor/ARB. Patient has not seen an ophthalmologist this year.  Patient is starting get some numbness and tingling on the bottom of his feet but denies any sores.. The symptom started onset as an adult hyperlipidemia ARE RELATED TO DM   Hyperlipidemia Patient is coming in for recheck of his hyperlipidemia. The patient is currently taking no medicine, doing diet control. They deny any issues with myalgias or history of liver damage from it. They deny any focal numbness or weakness or chest pain.   Elevated PSA It is time to recheck PSA  Vit d deficiency  Patient is coming in for recheck of vitamin D deficiency.  He has been taking the vitamin D and doing well with  Patient was also been taking vitamin B12 but is starting to get some numbness and tingling on the bottom of his feet.  1. Type 2 diabetes mellitus with other specified complication, without long-term current use of insulin (HCC)   2. Hypertriglyceridemia   3. Vitamin D deficiency   4. Elevated prostate specific antigen (PSA)   5. Benign  prostatic hyperplasia without lower urinary tract symptoms     Outpatient Encounter Medications as of 02/28/2023  Medication Sig   azithromycin (ZITHROMAX Z-PAK) 250 MG tablet Take 2-tabs on the first day and 1-tab daily until done   Cholecalciferol (VITAMIN D) 2000 units CAPS Take 2,000 Units by mouth daily.    Continuous Glucose Sensor (FREESTYLE LIBRE 3 PLUS SENSOR) MISC Change sensor every 15 days.   cyanocobalamin (VITAMIN B12) 500 MCG tablet Take 500 mcg by mouth daily.   cyclobenzaprine (FLEXERIL) 10 MG tablet Take 1 tablet (10 mg total) by mouth 3 (three) times daily as needed for muscle spasms.   fluticasone (FLONASE) 50 MCG/ACT nasal spray PLACE 1 SPRAY INTO BOTH NOSTRILS 2 (TWO) TIMES DAILY AS NEEDED FOR ALLERGIES OR RHINITIS.   guaiFENesin (MUCINEX) 600 MG 12 hr tablet Take by mouth 2 (two) times daily.   magnesium gluconate (MAGONATE) 500 MG tablet Take 500 mg by mouth daily.   metFORMIN (GLUCOPHAGE) 500 MG tablet Take 1 tablet (500 mg total) by mouth 2 (two) times daily with a meal.   methylPREDNISolone (MEDROL DOSEPAK) 4 MG TBPK tablet Follow instruction on the box   zinc gluconate 50 MG tablet Take 50 mg by mouth daily.   No facility-administered encounter medications on file as of 02/28/2023.    Review of Systems  Constitutional:  Negative for chills and fever.  Eyes:  Negative for visual disturbance.  Respiratory:  Negative for shortness of breath and wheezing.   Cardiovascular:  Negative for chest pain and  leg swelling.  Musculoskeletal:  Negative for arthralgias, back pain and gait problem.  Skin:  Negative for rash.  Neurological:  Positive for numbness. Negative for weakness.  All other systems reviewed and are negative.   Observations/Objective: Patient sounds comfortable and in no acute distress  Assessment and Plan: Problem List Items Addressed This Visit       Endocrine   Type 2 diabetes mellitus with other specified complication (HCC) - Primary    Relevant Orders   Bayer DCA Hb A1c Waived   CBC with Differential/Platelet   CMP14+EGFR     Genitourinary   BPH (benign prostatic hyperplasia)     Other   Hypertriglyceridemia   Relevant Orders   Lipid panel   Elevated prostate specific antigen (PSA)   Relevant Orders   PSA, total and free   Vitamin D deficiency   Relevant Orders   VITAMIN D 25 Hydroxy (Vit-D Deficiency, Fractures)    Given a sample of neurvive multivitamin for him to try. Follow up plan: Return in about 3 months (around 05/28/2023), or if symptoms worsen or fail to improve, for Diabetes and hyperlipidemia.     I discussed the assessment and treatment plan with the patient. The patient was provided an opportunity to ask questions and all were answered. The patient agreed with the plan and demonstrated an understanding of the instructions.   The patient was advised to call back or seek an in-person evaluation if the symptoms worsen or if the condition fails to improve as anticipated.  The above assessment and management plan was discussed with the patient. The patient verbalized understanding of and has agreed to the management plan. Patient is aware to call the clinic if symptoms persist or worsen. Patient is aware when to return to the clinic for a follow-up visit. Patient educated on when it is appropriate to go to the emergency department.    I provided 12 minutes of non-face-to-face time during this encounter.    Nils Pyle, MD

## 2023-03-09 ENCOUNTER — Other Ambulatory Visit: Payer: 59

## 2023-03-09 DIAGNOSIS — E559 Vitamin D deficiency, unspecified: Secondary | ICD-10-CM

## 2023-03-09 DIAGNOSIS — E1169 Type 2 diabetes mellitus with other specified complication: Secondary | ICD-10-CM

## 2023-03-09 DIAGNOSIS — E781 Pure hyperglyceridemia: Secondary | ICD-10-CM

## 2023-03-09 DIAGNOSIS — R972 Elevated prostate specific antigen [PSA]: Secondary | ICD-10-CM

## 2023-03-09 LAB — BAYER DCA HB A1C WAIVED: HB A1C (BAYER DCA - WAIVED): 6.2 % — ABNORMAL HIGH (ref 4.8–5.6)

## 2023-03-10 LAB — CBC WITH DIFFERENTIAL/PLATELET
Basophils Absolute: 0 10*3/uL (ref 0.0–0.2)
Basos: 1 %
EOS (ABSOLUTE): 0.1 10*3/uL (ref 0.0–0.4)
Eos: 1 %
Hematocrit: 47.5 % (ref 37.5–51.0)
Hemoglobin: 15.7 g/dL (ref 13.0–17.7)
Immature Grans (Abs): 0 10*3/uL (ref 0.0–0.1)
Immature Granulocytes: 1 %
Lymphocytes Absolute: 1.8 10*3/uL (ref 0.7–3.1)
Lymphs: 28 %
MCH: 27.9 pg (ref 26.6–33.0)
MCHC: 33.1 g/dL (ref 31.5–35.7)
MCV: 85 fL (ref 79–97)
Monocytes Absolute: 0.6 10*3/uL (ref 0.1–0.9)
Monocytes: 8 %
Neutrophils Absolute: 4 10*3/uL (ref 1.4–7.0)
Neutrophils: 61 %
Platelets: 202 10*3/uL (ref 150–450)
RBC: 5.62 x10E6/uL (ref 4.14–5.80)
RDW: 15.4 % (ref 11.6–15.4)
WBC: 6.5 10*3/uL (ref 3.4–10.8)

## 2023-03-10 LAB — CMP14+EGFR
ALT: 50 IU/L — ABNORMAL HIGH (ref 0–44)
AST: 29 IU/L (ref 0–40)
Albumin: 4.6 g/dL (ref 3.9–4.9)
Alkaline Phosphatase: 103 IU/L (ref 44–121)
BUN/Creatinine Ratio: 12 (ref 10–24)
BUN: 10 mg/dL (ref 8–27)
Bilirubin Total: 0.9 mg/dL (ref 0.0–1.2)
CO2: 24 mmol/L (ref 20–29)
Calcium: 9.5 mg/dL (ref 8.6–10.2)
Chloride: 102 mmol/L (ref 96–106)
Creatinine, Ser: 0.85 mg/dL (ref 0.76–1.27)
Globulin, Total: 1.9 g/dL (ref 1.5–4.5)
Glucose: 104 mg/dL — ABNORMAL HIGH (ref 70–99)
Potassium: 4.5 mmol/L (ref 3.5–5.2)
Sodium: 141 mmol/L (ref 134–144)
Total Protein: 6.5 g/dL (ref 6.0–8.5)
eGFR: 98 mL/min/{1.73_m2} (ref >59)

## 2023-03-10 LAB — LIPID PANEL
Chol/HDL Ratio: 3.5 ratio (ref 0.0–5.0)
Cholesterol, Total: 143 mg/dL (ref 100–199)
HDL: 41 mg/dL (ref >39)
LDL Chol Calc (NIH): 70 mg/dL (ref 0–99)
Triglycerides: 191 mg/dL — ABNORMAL HIGH (ref 0–149)
VLDL Cholesterol Cal: 32 mg/dL (ref 5–40)

## 2023-03-10 LAB — PSA, TOTAL AND FREE
PSA, Free Pct: 17.3 %
PSA, Free: 1.82 ng/mL
Prostate Specific Ag, Serum: 10.5 ng/mL — ABNORMAL HIGH (ref 0.0–4.0)

## 2023-03-10 LAB — VITAMIN D 25 HYDROXY (VIT D DEFICIENCY, FRACTURES): Vit D, 25-Hydroxy: 48.2 ng/mL (ref 30.0–100.0)

## 2023-03-14 ENCOUNTER — Encounter: Payer: Self-pay | Admitting: Family Medicine

## 2023-03-16 ENCOUNTER — Encounter: Payer: Self-pay | Admitting: Family Medicine

## 2023-04-24 ENCOUNTER — Encounter: Payer: Self-pay | Admitting: Family Medicine

## 2023-04-24 LAB — HM DIABETES EYE EXAM

## 2023-04-24 NOTE — Progress Notes (Signed)
 No retinopathy. Repeat one year.

## 2023-06-03 ENCOUNTER — Ambulatory Visit (HOSPITAL_COMMUNITY)

## 2023-06-03 ENCOUNTER — Ambulatory Visit (INDEPENDENT_AMBULATORY_CARE_PROVIDER_SITE_OTHER)

## 2023-06-03 ENCOUNTER — Ambulatory Visit
Admission: RE | Admit: 2023-06-03 | Discharge: 2023-06-03 | Disposition: A | Discharge status: Home / Self Care | Source: Ambulatory Visit | Attending: Emergency Medicine | Admitting: Emergency Medicine

## 2023-06-03 VITALS — BP 137/78 | HR 72 | Temp 99.1°F | Resp 18 | Ht 69.0 in | Wt 225.0 lb

## 2023-06-03 DIAGNOSIS — Z8709 Personal history of other diseases of the respiratory system: Secondary | ICD-10-CM | POA: Diagnosis not present

## 2023-06-03 DIAGNOSIS — R059 Cough, unspecified: Secondary | ICD-10-CM

## 2023-06-03 DIAGNOSIS — R051 Acute cough: Secondary | ICD-10-CM

## 2023-06-03 DIAGNOSIS — J069 Acute upper respiratory infection, unspecified: Secondary | ICD-10-CM

## 2023-06-03 MED ORDER — DEXAMETHASONE SODIUM PHOSPHATE 10 MG/ML IJ SOLN
10.0000 mg | Freq: Once | INTRAMUSCULAR | Status: AC
  Administered 2023-06-03: 10 mg via INTRAMUSCULAR

## 2023-06-03 NOTE — Discharge Instructions (Signed)
 Good news, your x-ray did not show any evidence of pneumonia.    Your symptoms are most likely related to a viral upper respiratory tract infection.  You can alternate between 800 mg of ibuprofen  and 500 mg of Tylenol  every 4-6 hours to help with any fever, body aches or chills.  Ensure you are staying well-hydrated, 1200 mg of Mucinex  and segment of the humidifier may help loosen up any secretions.  Follow-up with your primary care provider or return to clinic for any new or urgent symptoms.

## 2023-06-03 NOTE — ED Triage Notes (Signed)
 Patient c/o no cough but there's a "rumble" in his chest x 6 days.  There was nasal congestion which has subsided.  History of pneumonia x 2 and concerned this is what is going on now.  Patient denies any OTC cold meds.

## 2023-06-03 NOTE — ED Provider Notes (Addendum)
 [facility]    CSN: [id number] Arrival date & time: 06/03/23  1059      History   Chief Complaint Chief Complaint  Jimmy Mathis presents with   Influenza    rumble in chest, back pain - Entered by Jimmy Mathis    HPI Jimmy Mathis is a 63 y.o. male.   Jimmy Mathis presents to clinic over concern of a mild cough with a rumble in his chest.  Initially symptoms started 6 days ago with a 'tacky' throat.  Developed some nasal congestion and rhinorrhea, now Jimmy Mathis feels like that the congestion has been deeper within his chest.  Jimmy Mathis has not had any shortness of breath or wheezing.  Has had some back discomfort and fatigue.  Denies fevers.  Very mild cough.  Jimmy Mathis does have a history of pneumonia. No hx of asthma or COPD.   The history is provided by the Jimmy Mathis and medical records.  Influenza   Past Medical History:  Diagnosis Date   Anxiety    Blood transfusion without reported diagnosis    Diabetes mellitus without complication (HCC)    Sleep apnea     Jimmy Mathis Active Problem List   Diagnosis Date Noted   Acute non-recurrent frontal sinusitis 01/25/2023   Hypertriglyceridemia 08/14/2022   Erectile dysfunction 12/06/2020   Peyronie's disease 10/21/2018   BPH (benign prostatic hyperplasia) 02/06/2018   Vitamin D  deficiency 08/03/2016   OSA (obstructive sleep apnea) 02/04/2016   Hypersomnia 10/06/2015   Obesity 10/06/2015   Family history of colon cancer 07/30/2015   Type 2 diabetes mellitus with other specified complication (HCC) 04/29/2015   Elevated prostate specific antigen (PSA) 08/13/2013   GERD 04/04/2009    Past Surgical History:  Procedure Laterality Date   CHOLECYSTECTOMY     COLONOSCOPY     COLONOSCOPY N/A 09/27/2016   Procedure: COLONOSCOPY;  Surgeon: Suzette Espy, MD;  Location: [facility];  Service: Endoscopy;  Laterality: N/A;  830    POLYPECTOMY  09/27/2016   Procedure: POLYPECTOMY;  Surgeon: Suzette Espy, MD;  Location: [facility];  Service:  Endoscopy;;  colon   TENDON REPAIR     right hand   TONSILLECTOMY         Home Medications    Prior to Admission medications   Medication Sig Start Date End Date Taking? Authorizing Provider  Cholecalciferol (VITAMIN D ) 2000 units CAPS Take 2,000 Units by mouth daily.    Yes [provider]  Continuous Glucose Sensor (FREESTYLE LIBRE 3 PLUS SENSOR) MISC Change sensor every 15 days. 09/29/22  Yes Dettinger, Lucio Sabin, MD  cyanocobalamin (VITAMIN B12) 500 MCG tablet Take 500 mcg by mouth daily.   Yes [provider]  magnesium gluconate (MAGONATE) 500 MG tablet Take 500 mg by mouth daily.   Yes [provider]  metFORMIN  (GLUCOPHAGE ) 500 MG tablet Take 1 tablet (500 mg total) by mouth 2 (two) times daily with a meal. 11/24/22  Yes Dettinger, Lucio Sabin, MD  zinc gluconate 50 MG tablet Take 50 mg by mouth daily.   Yes [provider]  azithromycin  (ZITHROMAX  Z-PAK) 250 MG tablet Take 2-tabs on the first day and 1-tab daily until done 01/25/23   Anton Baton, NP  cyclobenzaprine  (FLEXERIL ) 10 MG tablet Take 1 tablet (10 mg total) by mouth 3 (three) times daily as needed for muscle spasms. 01/15/23   Dettinger, Lucio Sabin, MD  fluticasone  (FLONASE ) 50 MCG/ACT nasal spray PLACE 1 SPRAY INTO BOTH NOSTRILS 2 (TWO)  TIMES DAILY AS NEEDED FOR ALLERGIES OR RHINITIS. 09/05/22   Dettinger, Lucio Sabin, MD  guaiFENesin  (MUCINEX ) 600 MG 12 hr tablet Take by mouth 2 (two) times daily.    [provider]  methylPREDNISolone  (MEDROL  DOSEPAK) 4 MG TBPK tablet Follow instruction on the box 01/25/23   Anton Baton, NP    Family History Family History  Problem Relation Age of Onset   Cancer Father        lung   Diabetes Father    Colon cancer Other    Colon cancer Paternal Uncle    Colon cancer Maternal Grandfather    Diabetes Maternal Grandmother     Social History Social History   Tobacco Use   Smoking status: Former    Current packs/day:  1.50    Average packs/day: 1.5 packs/day for 12.0 years (18.0 ttl pk-yrs)    Types: Cigarettes   Smokeless tobacco: Never   Tobacco comments:    quit smoking at age 16  Vaping Use   Vaping status: Never Used  Substance Use Topics   Alcohol use: Yes    Comment: occasionally   Drug use: No     Allergies   Penicillins, Procaine hcl, Levaquin  [levofloxacin ], Minocycline , and Tetracyclines & related   Review of Systems Review of Systems  Per HPI  Physical Exam Triage Vital Signs ED Triage Vitals  Encounter Vitals Group     BP 06/03/23 1117 137/78     Systolic BP Percentile --      Diastolic BP Percentile --      Pulse Rate 06/03/23 1117 72     Resp 06/03/23 1117 18     Temp 06/03/23 1117 99.1 F (37.3 C)     Temp Source 06/03/23 1117 Oral     SpO2 06/03/23 1117 97 %     Weight 06/03/23 1116 225 lb (102.1 kg)     Height 06/03/23 1116 5\' 9"  (1.753 m)     Head Circumference --      Peak Flow --      Pain Score 06/03/23 1116 0     Pain Loc --      Pain Education --      Exclude from Growth Chart --    No data found.  Updated Vital Signs BP 137/78 (BP Location: Left Arm)   Pulse 72   Temp 99.1 F (37.3 C) (Oral)   Resp 18   Ht 5\' 9"  (1.753 m)   Wt 225 lb (102.1 kg)   SpO2 97%   BMI 33.23 kg/m   Visual Acuity Right Eye Distance:   Left Eye Distance:   Bilateral Distance:    Right Eye Near:   Left Eye Near:    Bilateral Near:     Physical Exam Vitals and nursing note reviewed.  Constitutional:      Appearance: Normal appearance.  HENT:     Head: Normocephalic and atraumatic.     Right Ear: External ear normal.     Left Ear: External ear normal.     Nose: Nose normal.     Mouth/Throat:     Mouth: Mucous membranes are moist.     Pharynx: Posterior oropharyngeal erythema present.  Eyes:     Conjunctiva/sclera: Conjunctivae normal.  Cardiovascular:     Rate and Rhythm: Normal rate and regular rhythm.     Heart sounds: Normal heart sounds. No murmur  heard. Pulmonary:     Effort: Pulmonary effort is normal. No respiratory distress.  Breath sounds: Normal breath sounds. No wheezing.  Skin:    General: Skin is warm and dry.  Neurological:     General: No focal deficit present.     Mental Status: Jimmy Mathis is alert.  Psychiatric:        Mood and Affect: Mood normal.      UC Treatments / Results  Labs (all labs ordered are listed, but only abnormal results are displayed) Labs Reviewed - No data to display  EKG   Radiology DG Chest 2 View Result Date: 06/03/2023 CLINICAL DATA:  Mild cough.  History of pneumonia. EXAM: CHEST - 2 VIEW COMPARISON:  06/21/2022 FINDINGS: Heart size and mediastinal contours appear normal. No pleural fluid or interstitial edema. No airspace opacities identified. Degenerative changes noted in the thoracic spine. IMPRESSION: No acute cardiopulmonary disease. Electronically Signed   By: Kimberley Penman M.D.   On: 06/03/2023 11:51    Procedures Procedures (including critical care time)  Medications Ordered in UC Medications  dexamethasone (DECADRON) injection 10 mg (has no administration in time range)    Initial Impression / Assessment and Plan / UC Course  I have reviewed the triage vital signs and the nursing notes.  Pertinent labs & imaging results that were available during my care of the Jimmy Mathis were reviewed by me and considered in my medical decision making (see chart for details).  Vitals in triage reviewed, Jimmy Mathis is hemodynamically stable.  Lungs are vesicular, heart regular rate and rhythm.  Postnasal drip and posterior pharynx erythema.  Chest x-ray by my interpretation does not show any infiltrates, confirmed with radiology overread.  Suspect viral URI, symptomatic management discussed.  Plan of care, follow-up care return precautions given, no questions at this time.  Upon discharge Jimmy Mathis requesting steroid shot and Z-pack.  Discussed that while this is viral in nature, we can  certainly do a steroid shot to help with any inflammation.  Antibiotics are not indicated at this time, return precautions given.    Final Clinical Impressions(s) / UC Diagnoses   Final diagnoses:  Acute cough  Viral URI with cough     Discharge Instructions      Good news, your x-ray did not show any evidence of pneumonia.    Your symptoms are most likely related to a viral upper respiratory tract infection.  You can alternate between 800 mg of ibuprofen  and 500 mg of Tylenol  every 4-6 hours to help with any fever, body aches or chills.  Ensure you are staying well-hydrated, 1200 mg of Mucinex  and segment of the humidifier may help loosen up any secretions.  Follow-up with your primary care provider or return to clinic for any new or urgent symptoms.   ED Prescriptions   None    PDMP not reviewed this encounter.   Harlow Lighter Aniella Wandrey  N, FNP 06/03/23 1158    Harlow Lighter, Kira Hartl  N, FNP 06/03/23 1204

## 2023-06-11 ENCOUNTER — Ambulatory Visit: Admitting: Family Medicine

## 2023-06-11 VITALS — BP 129/77 | HR 74 | Temp 97.7°F | Ht 69.0 in | Wt 208.0 lb

## 2023-06-11 DIAGNOSIS — E781 Pure hyperglyceridemia: Secondary | ICD-10-CM

## 2023-06-11 DIAGNOSIS — E1169 Type 2 diabetes mellitus with other specified complication: Secondary | ICD-10-CM | POA: Diagnosis not present

## 2023-06-11 DIAGNOSIS — E538 Deficiency of other specified B group vitamins: Secondary | ICD-10-CM | POA: Insufficient documentation

## 2023-06-11 DIAGNOSIS — Z7984 Long term (current) use of oral hypoglycemic drugs: Secondary | ICD-10-CM | POA: Diagnosis not present

## 2023-06-11 LAB — BAYER DCA HB A1C WAIVED: HB A1C (BAYER DCA - WAIVED): 6.2 % — ABNORMAL HIGH (ref 4.8–5.6)

## 2023-06-11 NOTE — Progress Notes (Signed)
 BP 129/77   Pulse 74   Temp 97.7 F (36.5 C)   Ht 5\' 9"  (1.753 m)   Wt 208 lb (94.3 kg)   SpO2 96%   BMI 30.72 kg/m    Subjective:   Patient ID: Jimmy Mathis, male    DOB: 06/20/1960, 63 y.o.   MRN: [id number]  HPI: Jimmy Mathis is a 63 y.o. male presenting on 06/11/2023 for Medical Management of Chronic Issues (Patient states type 2 recommendation is to get covid booster in the spring. )   HPI Type 2 diabetes mellitus Patient comes in today for recheck of his diabetes. Patient has been currently taking metformin . Patient is not currently on an ACE inhibitor/ARB. Patient has seen an ophthalmologist this year. Patient denies any new issues with their feet. The symptom started onset as an adult hypertriglyceridemia ARE RELATED TO DM, patient is also coming in for B12 deficiency recheck.  Hypertriglyceridemia Patient is coming in for recheck of his hypertriglyceridemia. The patient is currently taking no medicine, diet controlled. They deny any issues with myalgias or history of liver damage from it. They deny any focal numbness or weakness or chest pain.   Relevant past medical, surgical, family and social history reviewed and updated as indicated. Interim medical history since our last visit reviewed. Allergies and medications reviewed and updated.  Review of Systems  Constitutional:  Negative for chills and fever.  Eyes:  Negative for visual disturbance.  Respiratory:  Negative for shortness of breath and wheezing.   Cardiovascular:  Negative for chest pain and leg swelling.  Musculoskeletal:  Negative for back pain and gait problem.  Skin:  Negative for rash.  Neurological:  Negative for dizziness and light-headedness.  All other systems reviewed and are negative.   Per HPI unless specifically indicated above   Allergies as of 06/11/2023       Reactions   Penicillins Nausea And Vomiting, Swelling   High fever Has patient had a PCN reaction causing immediate rash,  facial/tongue/throat swelling, SOB or lightheadedness with hypotension: No Has patient had a PCN reaction causing severe rash involving mucus membranes or skin necrosis: No Has patient had a PCN reaction that required hospitalization: No Has patient had a PCN reaction occurring within the last 10 years: No If all of the above answers are "NO", then may proceed with Cephalosporin use.   Procaine Hcl Swelling   Novocaine   Levaquin  [levofloxacin ] Other (See Comments)   Caused pain in tendons   Minocycline  Other (See Comments)   Severe headache   Tetracyclines & Related    Headache        Medication List        Accurate as of June 11, 2023  3:00 PM. If you have any questions, ask your nurse or doctor.          STOP taking these medications    fluticasone  50 MCG/ACT nasal spray Commonly known as: FLONASE  Stopped by: Lucio Sabin Demetress Tift       TAKE these medications    azithromycin  250 MG tablet Commonly known as: Zithromax  Z-Pak Take 2-tabs on the first day and 1-tab daily until done   cyanocobalamin 500 MCG tablet Commonly known as: VITAMIN B12 Take 500 mcg by mouth daily.   cyclobenzaprine  10 MG tablet Commonly known as: FLEXERIL  Take 1 tablet (10 mg total) by mouth 3 (three) times daily as needed for muscle spasms.   FreeStyle Libre 3 Plus Sensor Misc Change sensor every 15 days.  guaiFENesin  600 MG 12 hr tablet Commonly known as: MUCINEX  Take by mouth 2 (two) times daily.   magnesium gluconate 500 MG tablet Commonly known as: MAGONATE Take 500 mg by mouth daily.   metFORMIN  500 MG tablet Commonly known as: GLUCOPHAGE  Take 1 tablet (500 mg total) by mouth 2 (two) times daily with a meal.   methylPREDNISolone  4 MG Tbpk tablet Commonly known as: MEDROL  DOSEPAK Follow instruction on the box   Vitamin D  50 MCG (2000 UT) Caps Take 2,000 Units by mouth daily.   zinc gluconate 50 MG tablet Take 50 mg by mouth daily.         Objective:   BP  129/77   Pulse 74   Temp 97.7 F (36.5 C)   Ht 5\' 9"  (1.753 m)   Wt 208 lb (94.3 kg)   SpO2 96%   BMI 30.72 kg/m   Wt Readings from Last 3 Encounters:  06/11/23 208 lb (94.3 kg)  06/03/23 225 lb (102.1 kg)  01/25/23 219 lb 3.2 oz (99.4 kg)    Physical Exam Vitals and nursing note reviewed.  Constitutional:      General: He is not in acute distress.    Appearance: He is well-developed. He is not diaphoretic.  Eyes:     General: No scleral icterus.    Conjunctiva/sclera: Conjunctivae normal.  Neck:     Thyroid: No thyromegaly.  Cardiovascular:     Rate and Rhythm: Normal rate and regular rhythm.     Heart sounds: Normal heart sounds. No murmur heard. Pulmonary:     Effort: Pulmonary effort is normal. No respiratory distress.     Breath sounds: Normal breath sounds. No wheezing.  Musculoskeletal:        General: No swelling. Normal range of motion.     Cervical back: Neck supple.  Lymphadenopathy:     Cervical: No cervical adenopathy.  Skin:    General: Skin is warm and dry.     Findings: No rash.  Neurological:     Mental Status: He is alert and oriented to person, place, and time.     Coordination: Coordination normal.  Psychiatric:        Behavior: Behavior normal.     Results for orders placed or performed in visit on 04/24/23  HM DIABETES EYE EXAM   Collection Time: 04/24/23  1:47 PM  Result Value Ref Range   HM Diabetic Eye Exam No Retinopathy No Retinopathy    Assessment & Plan:   Problem List Items Addressed This Visit       Endocrine   Type 2 diabetes mellitus with other specified complication (HCC) - Primary   Relevant Orders   Bayer DCA Hb A1c Waived   Vitamin B12     Other   Hypertriglyceridemia   B12 deficiency   Relevant Orders   Vitamin B12    A1c looks good at 6.2, blood pressure and everything else looks good today.  No changes Follow up plan: Return if symptoms worsen or fail to improve, for Physical exam and  diabetes.  Counseling provided for all of the vaccine components Orders Placed This Encounter  Procedures   Bayer DCA Hb A1c Waived   Vitamin B12    Jolyne Needs, MD [facility] 06/11/2023, 3:00 PM

## 2023-06-12 LAB — VITAMIN B12: Vitamin B-12: 334 pg/mL (ref 232–1245)

## 2023-06-18 ENCOUNTER — Ambulatory Visit: Payer: Self-pay | Admitting: Family Medicine

## 2023-09-13 ENCOUNTER — Ambulatory Visit: Admitting: Family Medicine

## 2023-09-13 ENCOUNTER — Encounter: Payer: Self-pay | Admitting: Family Medicine

## 2023-09-13 VITALS — BP 136/71 | HR 76 | Ht 69.0 in | Wt 214.0 lb

## 2023-09-13 DIAGNOSIS — R972 Elevated prostate specific antigen [PSA]: Secondary | ICD-10-CM

## 2023-09-13 DIAGNOSIS — E781 Pure hyperglyceridemia: Secondary | ICD-10-CM | POA: Diagnosis not present

## 2023-09-13 DIAGNOSIS — Z0000 Encounter for general adult medical examination without abnormal findings: Secondary | ICD-10-CM

## 2023-09-13 DIAGNOSIS — L2084 Intrinsic (allergic) eczema: Secondary | ICD-10-CM

## 2023-09-13 DIAGNOSIS — Z23 Encounter for immunization: Secondary | ICD-10-CM

## 2023-09-13 DIAGNOSIS — E1169 Type 2 diabetes mellitus with other specified complication: Secondary | ICD-10-CM

## 2023-09-13 DIAGNOSIS — Z0001 Encounter for general adult medical examination with abnormal findings: Secondary | ICD-10-CM

## 2023-09-13 DIAGNOSIS — E66811 Obesity, class 1: Secondary | ICD-10-CM

## 2023-09-13 DIAGNOSIS — Z6833 Body mass index (BMI) 33.0-33.9, adult: Secondary | ICD-10-CM

## 2023-09-13 DIAGNOSIS — Z114 Encounter for screening for human immunodeficiency virus [HIV]: Secondary | ICD-10-CM

## 2023-09-13 LAB — LIPID PANEL

## 2023-09-13 LAB — BAYER DCA HB A1C WAIVED: HB A1C (BAYER DCA - WAIVED): 5.9 % — ABNORMAL HIGH (ref 4.8–5.6)

## 2023-09-13 MED ORDER — KETOCONAZOLE 2 % EX CREA: 1.0000 | TOPICAL_CREAM | Freq: Every day | CUTANEOUS | 0 refills | Status: AC

## 2023-09-13 MED ORDER — METFORMIN HCL 500 MG PO TABS: 500.0000 mg | ORAL_TABLET | Freq: Two times a day (BID) | ORAL | 3 refills | Status: AC

## 2023-09-13 NOTE — Progress Notes (Signed)
 BP 136/71   Pulse 76   Ht 5' 9 (1.753 m)   Wt 214 lb (97.1 kg)   SpO2 96%   BMI 31.60 kg/m    Subjective:   Patient ID: Jimmy Mathis, male    DOB: 1960/06/29, 63 y.o.   MRN: [id number]  HPI: Jimmy Mathis is a 63 y.o. male presenting on 09/13/2023 for Medical Management of Chronic Issues and Diabetes   Discussed the use of AI scribe software for clinical note transcription with the patient, who gave verbal consent to proceed.  History of Present Illness   Jimmy Mathis is a 63 year old male who presents for a physical and checkup.  He has been monitoring his blood sugar levels, aiming to maintain them at 6.2, though he suspects a slight increase due to recent personal stressors, including a separation from his significant other and purchasing a house.  He has a history of high PSA levels and is inquiring about the need for a repeat PSA test, which was not included in his recent blood work. He is considering a referral to [facility] in [place], as he has previously been associated with them.  He is experiencing a recurrence of sciatica, which began about a week ago after playing pickleball and reaching for a low shot. He has taken a muscle relaxer once this week, which caused drowsiness, and manages the pain by sleeping in a recliner and taking ibuprofen  twice daily with food. The pain radiates down the back.  He has a history of diabetes and is conscientious about foot care, inquiring about the safety of using a foot peel product. He has not had an HIV check in over a year and requests to have it done.  He uses ketoconazole  cream for his face to reduce bumps on his scalp, which has been effective in the past. He is unsure if he needs a prescription for this medication from dermatology.          Relevant past medical, surgical, family and social history reviewed and updated as indicated. Interim medical history since our last visit reviewed. Allergies and  medications reviewed and updated.  Review of Systems  Constitutional:  Negative for chills and fever.  HENT:  Negative for ear pain and tinnitus.   Eyes:  Negative for pain and discharge.  Respiratory:  Negative for cough, shortness of breath and wheezing.   Cardiovascular:  Negative for chest pain, palpitations and leg swelling.  Gastrointestinal:  Negative for abdominal pain, blood in stool, constipation and diarrhea.  Genitourinary:  Negative for dysuria and hematuria.  Musculoskeletal:  Positive for arthralgias and back pain. Negative for gait problem and myalgias.  Skin:  Negative for rash.  Neurological:  Negative for dizziness, weakness and headaches.  Psychiatric/Behavioral:  Negative for suicidal ideas.   All other systems reviewed and are negative.   Per HPI unless specifically indicated above   Allergies as of 09/13/2023       Reactions   Penicillins Nausea And Vomiting, Swelling   High fever Has patient had a PCN reaction causing immediate rash, facial/tongue/throat swelling, SOB or lightheadedness with hypotension: No Has patient had a PCN reaction causing severe rash involving mucus membranes or skin necrosis: No Has patient had a PCN reaction that required hospitalization: No Has patient had a PCN reaction occurring within the last 10 years: No If all of the above answers are NO, then may proceed with Cephalosporin use.   Procaine Hcl Swelling  Novocaine   Levaquin  [levofloxacin ] Other (See Comments)   Caused pain in tendons   Minocycline  Other (See Comments)   Severe headache   Tetracyclines & Related    Headache        Medication List        Accurate as of September 13, 2023  3:58 PM. If you have any questions, ask your nurse or doctor.          STOP taking these medications    cyclobenzaprine  10 MG tablet Commonly known as: FLEXERIL  Stopped by: Fonda LABOR Roger Kettles   guaiFENesin  600 MG 12 hr tablet Commonly known as: MUCINEX  Stopped by:  Fonda LABOR Eller Sweis       TAKE these medications    cyanocobalamin 500 MCG tablet Commonly known as: VITAMIN B12 Take 500 mcg by mouth daily.   FreeStyle Libre 3 Plus Sensor Misc Change sensor every 15 days.   ketoconazole  2 % cream Commonly known as: NIZORAL  Apply 1 Application topically daily. Started by: Fonda LABOR Money Mckeithan   magnesium gluconate 500 MG tablet Commonly known as: MAGONATE Take 500 mg by mouth daily.   metFORMIN  500 MG tablet Commonly known as: GLUCOPHAGE  Take 1 tablet (500 mg total) by mouth 2 (two) times daily with a meal.   Vitamin D  50 MCG (2000 UT) Caps Take 2,000 Units by mouth daily.   zinc gluconate 50 MG tablet Take 50 mg by mouth daily.         Objective:   BP 136/71   Pulse 76   Ht 5' 9 (1.753 m)   Wt 214 lb (97.1 kg)   SpO2 96%   BMI 31.60 kg/m   Wt Readings from Last 3 Encounters:  09/13/23 214 lb (97.1 kg)  06/11/23 208 lb (94.3 kg)  06/03/23 225 lb (102.1 kg)    Physical Exam Vitals and nursing note reviewed.  HENT:     Right Ear: Tympanic membrane normal.     Left Ear: Tympanic membrane normal.     Mouth/Throat:     Mouth: Mucous membranes are moist.     Pharynx: Oropharynx is clear. No oropharyngeal exudate or posterior oropharyngeal erythema.  Musculoskeletal:        General: No swelling or tenderness.    Physical Exam   VITALS: BP- 136/71 NECK: Thyroid without nodules or enlargement. CHEST: Lungs clear to auscultation bilaterally. CARDIOVASCULAR: Heart regular rate and rhythm, no murmurs. ABDOMEN: No costovertebral angle tenderness. Abdomen non-tender. SKIN: Feet without open sores or lesions.         Assessment & Plan:   Problem List Items Addressed This Visit       Endocrine   Type 2 diabetes mellitus with other specified complication (HCC)   Relevant Medications   metFORMIN  (GLUCOPHAGE ) 500 MG tablet   Other Relevant Orders   Bayer DCA Hb A1c Waived   CBC with Differential/Platelet    CMP14+EGFR   Lipid panel     Other   Hypertriglyceridemia   Elevated prostate specific antigen (PSA)   Relevant Orders   PSA, total and free   Obesity   Relevant Medications   metFORMIN  (GLUCOPHAGE ) 500 MG tablet   Other Visit Diagnoses       Physical exam    -  Primary   Relevant Orders   HIV Antibody (routine testing w rflx)   PSA, total and free     High priority for COVID-19 vaccination       Relevant Orders   Pfizer Comirnaty Covid -19  Vaccine 28yrs and older     Intrinsic atopic dermatitis       Relevant Medications   ketoconazole  (NIZORAL ) 2 % cream     Encounter for screening for HIV       Relevant Orders   HIV Antibody (routine testing w rflx)         Adult Wellness Visit Routine wellness visit with well-controlled blood pressure. Heart rate and oxygen  saturation normal. - Perform physical examination.  Type 2 diabetes mellitus Diabetes management affected by life changes. Aims to maintain HbA1c at 6.2%. Discussed steroid impact on blood sugar. Advised on foot peel safety. - Monitor blood sugar levels. - Discuss potential impact of steroid treatment on blood sugar levels. - Advise on safe use of foot peels.  Elevated prostate specific antigen (PSA) PSA levels elevated. Discussed monitoring importance. Prefers evaluation at [facility]. - Order PSA test. - Consider referral to [facility] for further evaluation.  Sciatica Recent sciatica symptoms post-activity. Discussed treatment options. Prefers ibuprofen . Discussed steroid injection costs. - Use ibuprofen  as needed for pain management. - Return for further evaluation if symptoms worsen.  Diastasis recti Presence of diastasis recti. Discussed causes and management options. - Consider abdominal strengthening exercises to manage diastasis recti. - Advise on use of abdominal bands for support.  Torn biceps and rotator cuff injury Chronic injury limiting activity. Suggested swimming as  low-impact exercise. - Consider physical therapy or other interventions if symptoms worsen. - Consider swimming as a low-impact exercise option.  Seborrheic dermatitis (face and scalp) Managed with ketoconazole  cream. Discussed use for facial dermatitis and potential dermatology referral. - Prescribe ketoconazole  cream for seborrheic dermatitis. - Consider dermatology referral for additional treatments.  General Health Maintenance Discussed need for updated COVID-19 vaccination due to diabetes risk. - Provide prescription for COVID-19 vaccine.          Follow up plan: Return in about 3 months (around 12/13/2023), or if symptoms worsen or fail to improve, for Diabetes recheck.  Counseling provided for all of the vaccine components Orders Placed This Encounter  Procedures   Pfizer Comirnaty Covid -19 Vaccine 33yrs and older   Bayer DCA Hb A1c Waived   CBC with Differential/Platelet   CMP14+EGFR   Lipid panel   HIV Antibody (routine testing w rflx)   PSA, total and free    Fonda Levins, MD [facility] 09/13/2023, 3:58 PM

## 2023-09-14 LAB — CBC WITH DIFFERENTIAL/PLATELET
Basophils Absolute: 0.1 x10E3/uL (ref 0.0–0.2)
Basos: 1 %
EOS (ABSOLUTE): 0.1 x10E3/uL (ref 0.0–0.4)
Eos: 2 %
Hematocrit: 45.4 % (ref 37.5–51.0)
Hemoglobin: 15.1 g/dL (ref 13.0–17.7)
Immature Grans (Abs): 0 x10E3/uL (ref 0.0–0.1)
Immature Granulocytes: 0 %
Lymphocytes Absolute: 2.1 x10E3/uL (ref 0.7–3.1)
Lymphs: 30 %
MCH: 28.4 pg (ref 26.6–33.0)
MCHC: 33.3 g/dL (ref 31.5–35.7)
MCV: 85 fL (ref 79–97)
Monocytes Absolute: 0.5 x10E3/uL (ref 0.1–0.9)
Monocytes: 7 %
Neutrophils Absolute: 4 x10E3/uL (ref 1.4–7.0)
Neutrophils: 60 %
Platelets: 195 x10E3/uL (ref 150–450)
RBC: 5.32 x10E6/uL (ref 4.14–5.80)
RDW: 13.5 % (ref 11.6–15.4)
WBC: 6.7 x10E3/uL (ref 3.4–10.8)

## 2023-09-14 LAB — CMP14+EGFR
ALT: 30 IU/L (ref 0–44)
AST: 21 IU/L (ref 0–40)
Albumin: 4.5 g/dL (ref 3.9–4.9)
Alkaline Phosphatase: 108 IU/L (ref 44–121)
BUN/Creatinine Ratio: 13 (ref 10–24)
BUN: 13 mg/dL (ref 8–27)
Bilirubin Total: 0.7 mg/dL (ref 0.0–1.2)
CO2: 22 mmol/L (ref 20–29)
Calcium: 9.9 mg/dL (ref 8.6–10.2)
Chloride: 100 mmol/L (ref 96–106)
Creatinine, Ser: 1.04 mg/dL (ref 0.76–1.27)
Globulin, Total: 2 g/dL (ref 1.5–4.5)
Glucose: 165 mg/dL — ABNORMAL HIGH (ref 70–99)
Potassium: 4.6 mmol/L (ref 3.5–5.2)
Sodium: 137 mmol/L (ref 134–144)
Total Protein: 6.5 g/dL (ref 6.0–8.5)
eGFR: 80 mL/min/1.73 (ref >59)

## 2023-09-14 LAB — HIV ANTIBODY (ROUTINE TESTING W REFLEX): HIV Screen 4th Generation wRfx: NONREACTIVE

## 2023-09-14 LAB — LIPID PANEL
Cholesterol, Total: 144 mg/dL (ref 100–199)
HDL: 35 mg/dL — AB (ref >39)
LDL CALC COMMENT:: 4.1 ratio (ref 0.0–5.0)
LDL Chol Calc (NIH): 68 mg/dL (ref 0–99)
Triglycerides: 249 mg/dL — AB (ref 0–149)
VLDL Cholesterol Cal: 41 mg/dL — AB (ref 5–40)

## 2023-09-14 LAB — PSA, TOTAL AND FREE
PSA, Free Pct: 20.4
PSA, Free: 1.45 ng/mL
Prostate Specific Ag, Serum: 7.1 ng/mL — AB (ref 0.0–4.0)

## 2023-09-20 ENCOUNTER — Ambulatory Visit: Payer: Self-pay | Admitting: Family Medicine

## 2023-10-17 ENCOUNTER — Other Ambulatory Visit: Payer: Self-pay | Admitting: Family Medicine

## 2023-10-17 NOTE — Telephone Encounter (Signed)
 Patient called. States he originally requested this with Pharmacy 10 days ago. They said they would resend today. Has 3 days left on current one. Wanted to make sure request was received. Thank You

## 2023-10-29 ENCOUNTER — Telehealth: Payer: Self-pay

## 2023-10-29 ENCOUNTER — Encounter: Payer: Self-pay | Admitting: Family Medicine

## 2023-10-29 NOTE — Telephone Encounter (Signed)
 Patient called the office to make a new patient appt and was transferred to my voicemail.   I called the patient back and left a detailed message providing my direct contact, advising them to contact his PCP office to have him send over a referral to our office. Then I can contact him after to schedule an apointment. I advised him if he had any questions to contact me directly.

## 2023-10-30 ENCOUNTER — Other Ambulatory Visit: Payer: Self-pay

## 2023-10-30 DIAGNOSIS — R972 Elevated prostate specific antigen [PSA]: Secondary | ICD-10-CM

## 2023-10-31 ENCOUNTER — Ambulatory Visit: Admitting: Family Medicine

## 2023-10-31 MED ORDER — SULFAMETHOXAZOLE-TRIMETHOPRIM 800-160 MG PO TABS: 1.0000 | ORAL_TABLET | Freq: Two times a day (BID) | ORAL | 0 refills | Status: DC

## 2023-10-31 NOTE — Telephone Encounter (Signed)
 Copied from CRM #[id number]. Topic: Referral - Request for Referral >> Oct 29, 2023  3:54 PM Delon DASEN wrote: Did the patient discuss referral with their provider in the last year? Yes (If No - schedule appointment) (If Yes - send message)  Appointment offered? No  Type of order/referral and detailed reason for visit: referral to urology  Preference of office, provider, location: [place]  If referral order, have you been seen by this specialty before? Yes (If Yes, this issue or another issue? When? Where? [facility]  Can we respond through MyChart? Yes

## 2023-12-12 ENCOUNTER — Encounter: Payer: Self-pay | Admitting: Family Medicine

## 2023-12-13 ENCOUNTER — Ambulatory Visit: Payer: Self-pay | Admitting: Family Medicine

## 2023-12-13 ENCOUNTER — Encounter: Payer: Self-pay | Admitting: Family Medicine

## 2023-12-13 VITALS — BP 137/77 | HR 84 | Ht 69.0 in | Wt 220.0 lb

## 2023-12-13 DIAGNOSIS — E1169 Type 2 diabetes mellitus with other specified complication: Secondary | ICD-10-CM

## 2023-12-13 LAB — BAYER DCA HB A1C WAIVED: HB A1C (BAYER DCA - WAIVED): 6.3 % — ABNORMAL HIGH (ref 4.8–5.6)

## 2023-12-13 NOTE — Progress Notes (Signed)
 BP 137/77   Pulse 84   Ht 5' 9 (1.753 m)   Wt 220 lb (99.8 kg)   SpO2 97%   BMI 32.49 kg/m    Subjective:   Patient ID: Jimmy Mathis, male    DOB: 06/18/1960, 63 y.o.   MRN: [id number]  HPI: Jimmy Mathis is a 63 y.o. male presenting on 12/13/2023 for Medical Management of Chronic Issues and Diabetes   Discussed the use of AI scribe software for clinical note transcription with the patient, who gave verbal consent to proceed.  History of Present Illness   Jimmy Mathis is a 63 year old male with diabetes who presents for a recheck of his prostate treatment and diabetes management.  Lower urinary tract symptoms - Recent treatment for prostate issues has alleviated symptoms. - Follow-up appointment scheduled in January with another provider to ensure proper healing.  Glycemic control - Diabetes managed with metformin  500 mg twice daily. - Blood glucose levels may be elevated due to increased stress eating, particularly during the holiday season. - Supplementation with B12, magnesium, and vitamin D .  Psychosocial stressors - Experiences work-related stress as a runner, broadcasting/film/video, contributing to increased eating habits. 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          Relevant past medical, surgical, family and social history reviewed and updated as indicated. Interim medical history since our last visit reviewed. Allergies and medications reviewed and updated.  Review of Systems  Constitutional:  Negative for chills and fever.  Eyes:  Negative for visual disturbance.  Respiratory:  Negative for shortness of breath and wheezing.   Cardiovascular:  Negative for chest pain and leg swelling.  Musculoskeletal:  Negative for back pain and gait problem.  Skin:  Negative for rash.  Neurological:  Negative for dizziness and light-headedness.  All other systems reviewed and are negative.   Per  HPI unless specifically indicated above   Allergies as of 12/13/2023       Reactions   Penicillins Nausea And Vomiting, Swelling   High fever Has patient had a PCN reaction causing immediate rash, facial/tongue/throat swelling, SOB or lightheadedness with hypotension: No Has patient had a PCN reaction causing severe rash involving mucus membranes or skin necrosis: No Has patient had a PCN reaction that required hospitalization: No Has patient had a PCN reaction occurring within the last 10 years: No If all of the above answers are NO, then may proceed with Cephalosporin use.   Procaine Hcl Swelling   Novocaine   Levaquin  [levofloxacin ] Other (See Comments)   Caused pain in tendons   Minocycline  Other (See Comments)   Severe headache   Tetracyclines & Related    Headache        Medication List        Accurate as of December 13, 2023  4:28 PM. If you have any questions, ask your nurse or doctor.          cyanocobalamin 500 MCG tablet Commonly known as: VITAMIN B12 Take 500 mcg by mouth daily.   FreeStyle Libre 3 Plus Sensor Misc CHANGE SENSOR EVERY 15 DAYS.   ketoconazole  2 % cream Commonly known as: NIZORAL  Apply 1 Application topically daily.   magnesium gluconate 500 MG tablet Commonly known as: MAGONATE Take 500 mg by mouth daily.   metFORMIN  500 MG tablet Commonly known as: GLUCOPHAGE  Take 1 tablet (500 mg total) by mouth 2 (two) times daily with  a meal.   sulfamethoxazole -trimethoprim  800-160 MG tablet Commonly known as: Bactrim  DS Take 1 tablet by mouth 2 (two) times daily.   Vitamin D  50 MCG (2000 UT) Caps Take 2,000 Units by mouth daily.   zinc gluconate 50 MG tablet Take 50 mg by mouth daily.         Objective:   BP 137/77   Pulse 84   Ht 5' 9 (1.753 m)   Wt 220 lb (99.8 kg)   SpO2 97%   BMI 32.49 kg/m   Wt Readings from Last 3 Encounters:  12/13/23 220 lb (99.8 kg)  09/13/23 214 lb (97.1 kg)  06/11/23 208 lb (94.3 kg)     Physical Exam Physical Exam   VITALS: BP- 137/77 CHEST: Lungs clear to auscultation. CARDIOVASCULAR: Heart regular rate and rhythm, no murmurs.         Assessment & Plan:   Problem List Items Addressed This Visit       Endocrine   Type 2 diabetes mellitus with other specified complication (HCC) - Primary   Relevant Orders   Bayer DCA Hb A1c Waived   Type 2 diabetes mellitus with hypertriglyceridemia (HCC)       Type 2 Diabetes Mellitus Type 2 diabetes with potential for increased blood glucose due to holiday eating and stress. Managed with metformin . Blood pressure controlled. - Continue metformin  500 mg twice daily. - Encouraged reduction of carbohydrate intake. - Advised on regular exercise. - Advised on weight loss for blood glucose and blood pressure management. - Instructed to monitor blood glucose levels during holidays.  General Health Maintenance Continued supplementation with B12, magnesium, and vitamin D . - Continue B12, magnesium, and vitamin D  supplementation.     A1c is 6.3, up from where he is normally at, mainly focus on diet.     Follow up plan: Return in about 3 months (around 03/12/2024), or if symptoms worsen or fail to improve, for Diabetes recheck.  Counseling provided for all of the vaccine components Orders Placed This Encounter  Procedures   Bayer DCA Hb A1c Waived    Fonda Levins, MD [facility] 12/13/2023, 4:28 PM

## 2023-12-17 ENCOUNTER — Encounter: Payer: Self-pay | Admitting: Family Medicine

## 2023-12-19 ENCOUNTER — Ambulatory Visit: Admitting: Family Medicine

## 2023-12-19 VITALS — BP 135/76 | HR 93 | Temp 97.8°F | Ht 69.0 in | Wt 218.0 lb

## 2023-12-19 DIAGNOSIS — J329 Chronic sinusitis, unspecified: Secondary | ICD-10-CM

## 2023-12-19 DIAGNOSIS — J40 Bronchitis, not specified as acute or chronic: Secondary | ICD-10-CM | POA: Diagnosis not present

## 2023-12-19 MED ORDER — BETAMETHASONE SOD PHOS & ACET 6 (3-3) MG/ML IJ SUSP
6.0000 mg | Freq: Once | INTRAMUSCULAR | Status: AC
  Administered 2023-12-19: 6 mg via INTRAMUSCULAR

## 2023-12-19 MED ORDER — SULFAMETHOXAZOLE-TRIMETHOPRIM 800-160 MG PO TABS: 1.0000 | ORAL_TABLET | Freq: Two times a day (BID) | ORAL | 0 refills | Status: AC

## 2023-12-19 MED ORDER — PROMETHAZINE-DM 6.25-15 MG/5ML PO SYRP: 5.0000 mL | ORAL_SOLUTION | Freq: Four times a day (QID) | ORAL | 0 refills | Status: AC | PRN

## 2023-12-19 NOTE — Progress Notes (Signed)
 Subjective:  Patient ID: Jimmy Mathis, male    DOB: 19-May-1960  Age: 63 y.o. MRN: [id number]  CC: sick (Started last Thursday with a loss of voice now having sinus pressure.  Yellow green mucous.Using mucinex . Covid test neg. )   HPI  Discussed the use of AI scribe software for clinical note transcription with the patient, who gave verbal consent to proceed.  History of Present Illness Jimmy Mathis is a 63 year old male who presents with sinus pain and congestion.  He initially experienced voice loss, which progressed to severe sinus pain, particularly in the forehead area. He describes the pain as intense, stating 'my sinuses are killing me.'  He has nasal discharge that is yellowish and occasionally contains blood, along with a cough producing yellow sputum. No shortness of breath or fever. He denies earaches.  He has been using Mucinex  for the past three to four days but did not take it today due to side effects. He tested negative for COVID-19 on the third day of symptoms.          09/13/2023    3:38 PM 06/11/2023    2:54 PM 01/15/2023   10:57 AM  Depression screen PHQ 2/9  Decreased Interest 0 0 0  Down, Depressed, Hopeless 0 0 0  PHQ - 2 Score 0 0 0  Altered sleeping  1   Tired, decreased energy  1   Change in appetite  0   Feeling bad or failure about yourself   0   Trouble concentrating  0   Moving slowly or fidgety/restless  0   Suicidal thoughts  0   PHQ-9 Score  2    Difficult doing work/chores  Not difficult at all      Data saved with a previous flowsheet row definition    History Calder has a past medical history of Anxiety, Blood transfusion without reported diagnosis, Diabetes mellitus without complication (HCC), and Sleep apnea.   He has a past surgical history that includes Tonsillectomy; Cholecystectomy; Tendon repair; Colonoscopy; Colonoscopy (N/A, 09/27/2016); and polypectomy (09/27/2016).   His family history includes Cancer in his father; Colon  cancer in his maternal grandfather, paternal uncle, and another family member; Diabetes in his father and maternal grandmother.He reports that he has quit smoking. His smoking use included cigarettes. He has a 18 pack-year smoking history. He has never used smokeless tobacco. He reports current alcohol use. He reports that he does not use drugs.    ROS Review of Systems  Constitutional:  Negative for fever.  Respiratory:  Negative for shortness of breath.   Cardiovascular:  Negative for chest pain.  Musculoskeletal:  Negative for arthralgias.  Skin:  Negative for rash.    Objective:  BP 135/76   Pulse 93   Temp 97.8 F (36.6 C)   Ht 5' 9 (1.753 m)   Wt 218 lb (98.9 kg)   SpO2 96%   BMI 32.19 kg/m   BP Readings from Last 3 Encounters:  12/19/23 135/76  12/13/23 137/77  09/13/23 136/71    Wt Readings from Last 3 Encounters:  12/19/23 218 lb (98.9 kg)  12/13/23 220 lb (99.8 kg)  09/13/23 214 lb (97.1 kg)     Physical Exam Physical Exam GENERAL: Alert, cooperative, well developed, no acute distress. HEENT: Normocephalic, normal oropharynx, moist mucous membranes, mild tenderness on frontal sinus palpation. CHEST: Clear to auscultation bilaterally, no wheezes, rhonchi, or crackles. CARDIOVASCULAR: Normal heart rate and rhythm, S1 and S2 normal  without murmurs. ABDOMEN: Soft, non-tender, non-distended, without organomegaly, normal bowel sounds. EXTREMITIES: No cyanosis or edema. NEUROLOGICAL: Cranial nerves grossly intact, moves all extremities without gross motor or sensory deficit.   Assessment & Plan:  Sinobronchitis -     Betamethasone  Sod Phos & Acet  Other orders -     Sulfamethoxazole -Trimethoprim ; Take 1 tablet by mouth 2 (two) times daily. Until gone, for infection  Dispense: 20 tablet; Refill: 0 -     Promethazine -DM; Take 5 mLs by mouth 4 (four) times daily as needed for cough.  Dispense: 240 mL; Refill: 0    Assessment and Plan Assessment &  Plan Sinobronchitis   He presents with acute sinobronchitis characterized by significant sinus pain in the forehead, yellowish nasal discharge with occasional blood, and a productive cough. There is no fever or shortness of breath, and the COVID test is negative. He has allergies and previously used Mucinex  for symptom relief. He has not recently used penicillins or cephalosporins, but Bactrim  was recently used for prostatitis without adverse effects. A cortisone injection was administered to reduce inflammation and expedite recovery. Bactrim  is prescribed for infection management. Over-the-counter cough syrup is recommended for symptomatic relief.       Follow-up: No follow-ups on file.  Butler Der, M.D.

## 2023-12-22 ENCOUNTER — Encounter: Payer: Self-pay | Admitting: Family Medicine

## 2024-01-07 ENCOUNTER — Encounter: Payer: Self-pay | Admitting: Family Medicine

## 2024-01-07 ENCOUNTER — Ambulatory Visit: Admitting: Urology

## 2024-01-07 VITALS — BP 131/75 | HR 105

## 2024-01-07 DIAGNOSIS — R3912 Poor urinary stream: Secondary | ICD-10-CM

## 2024-01-07 DIAGNOSIS — R3915 Urgency of urination: Secondary | ICD-10-CM

## 2024-01-07 DIAGNOSIS — R972 Elevated prostate specific antigen [PSA]: Secondary | ICD-10-CM | POA: Diagnosis not present

## 2024-01-07 DIAGNOSIS — N401 Enlarged prostate with lower urinary tract symptoms: Secondary | ICD-10-CM

## 2024-01-07 DIAGNOSIS — R350 Frequency of micturition: Secondary | ICD-10-CM

## 2024-01-07 MED ORDER — TADALAFIL 5 MG PO TABS: 5.0000 mg | ORAL_TABLET | Freq: Every day | ORAL | 11 refills | Status: AC

## 2024-01-07 NOTE — Progress Notes (Unsigned)
 "  01/07/2024 1:05 PM   Jimmy Mathis [birth year] [id number]  Referring provider: Dettinger, Fonda LABOR, MD [address] [place],  [place] [address]  No chief complaint on file.   HPI:  New patient-previously saw Dr. Janit at [facility]-  1) BPH-prostate about 78 g on prior ultrasound in 2021.  IPSS 12.  Tried daily tadalafil .   2) PSA elevation-PSA was 4.5 in 2017 and then 12.5 in 2021.  Status post negative TRUS biopsy 05/06/2019 with a 78 g prostate.  No family history of prostate cancer.  2023 PSA 8.5.  February 2025 PSA 10.5 in September 2025 PSA 7.1.  01/07/2024: Today patient is seen for the above. IPSS 22. Wk stream, frequency, urgency.   UA clear.   He is a [employer]. He teaches math/science.    PMH: Past Medical History:  Diagnosis Date   Anxiety    Blood transfusion without reported diagnosis    Diabetes mellitus without complication (HCC)    Sleep apnea     Surgical History: Past Surgical History:  Procedure Laterality Date   CHOLECYSTECTOMY     COLONOSCOPY     COLONOSCOPY N/A 09/27/2016   Procedure: COLONOSCOPY;  Surgeon: Shaaron Lamar HERO, MD;  Location: [facility];  Service: Endoscopy;  Laterality: N/A;  830    POLYPECTOMY  09/27/2016   Procedure: POLYPECTOMY;  Surgeon: Shaaron Lamar HERO, MD;  Location: [facility];  Service: Endoscopy;;  colon   TENDON REPAIR     right hand   TONSILLECTOMY      Home Medications:  Allergies as of 01/07/2024       Reactions   Penicillins Nausea And Vomiting, Swelling   High fever Has patient had a PCN reaction causing immediate rash, facial/tongue/throat swelling, SOB or lightheadedness with hypotension: No Has patient had a PCN reaction causing severe rash involving mucus membranes or skin necrosis: No Has patient had a PCN reaction that required hospitalization: No Has patient had a PCN reaction occurring within the last 10 years: No If all of the above answers are NO, then may proceed with Cephalosporin  use.   Procaine Hcl Swelling   Novocaine   Levaquin  [levofloxacin ] Other (See Comments)   Caused pain in tendons   Minocycline  Other (See Comments)   Severe headache   Tetracyclines & Related    Headache        Medication List        Accurate as of January 07, 2024  1:05 PM. If you have any questions, ask your nurse or doctor.          cyanocobalamin 500 MCG tablet Commonly known as: VITAMIN B12 Take 500 mcg by mouth daily.   FreeStyle Libre 3 Plus Sensor Misc CHANGE SENSOR EVERY 15 DAYS.   ketoconazole  2 % cream Commonly known as: NIZORAL  Apply 1 Application topically daily.   magnesium gluconate 500 MG tablet Commonly known as: MAGONATE Take 500 mg by mouth daily.   metFORMIN  500 MG tablet Commonly known as: GLUCOPHAGE  Take 1 tablet (500 mg total) by mouth 2 (two) times daily with a meal.   promethazine -dextromethorphan 6.25-15 MG/5ML syrup Commonly known as: PROMETHAZINE -DM Take 5 mLs by mouth 4 (four) times daily as needed for cough.   sulfamethoxazole -trimethoprim  800-160 MG tablet Commonly known as: BACTRIM  DS Take 1 tablet by mouth 2 (two) times daily. Until gone, for infection   Vitamin D  50 MCG (2000 UT) Caps Take 2,000 Units by mouth daily.   zinc  gluconate 50 MG tablet Take 50 mg by mouth daily.        Allergies: Allergies[1]  Family History: Family History  Problem Relation Age of Onset   Cancer Father        lung   Diabetes Father    Colon cancer Other    Colon cancer Paternal Uncle    Colon cancer Maternal Grandfather    Diabetes Maternal Grandmother     Social History:  reports that he has quit smoking. His smoking use included cigarettes. He has a 18 pack-year smoking history. He has never used smokeless tobacco. He reports current alcohol use. He reports that he does not use drugs.   Physical Exam: There were no vitals taken for this visit.  Constitutional:  Alert and oriented, No acute distress. HEENT: [place] AT, moist  mucus membranes.  Trachea midline, no masses. Cardiovascular: No clubbing, cyanosis, or edema. Respiratory: Normal respiratory effort, no increased work of breathing. GI: Abdomen is soft, nontender, nondistended, no abdominal masses GU: No CVA tenderness Skin: No rashes, bruises or suspicious lesions. Neurologic: Grossly intact, no focal deficits, moving all 4 extremities. Psychiatric: Normal mood and affect.  Laboratory Data: Lab Results  Component Value Date   WBC 6.7 09/13/2023   HGB 15.1 09/13/2023   HCT 45.4 09/13/2023   MCV 85 09/13/2023   PLT 195 09/13/2023    Lab Results  Component Value Date   CREATININE 1.04 09/13/2023    No results found for: PSA  Lab Results  Component Value Date   TESTOSTERONE  270 08/01/2018    Lab Results  Component Value Date   HGBA1C 6.3 (H) 12/13/2023    Urinalysis    Component Value Date/Time   COLORURINE YELLOW 09/29/2008 0905   APPEARANCEUR Clear 05/30/2019 1607   LABSPEC 1.020 09/29/2008 0905   PHURINE 8.5 (H) 09/29/2008 0905   GLUCOSEU Negative 05/30/2019 1607   HGBUR NEGATIVE 09/29/2008 0905   BILIRUBINUR Negative 05/30/2019 1607   KETONESUR NEGATIVE 09/29/2008 0905   PROTEINUR Negative 05/30/2019 1607   PROTEINUR NEGATIVE 09/29/2008 0905   UROBILINOGEN 0.2 09/29/2008 0905   NITRITE Negative 05/30/2019 1607   NITRITE NEGATIVE 09/29/2008 0905   LEUKOCYTESUR Negative 05/30/2019 1607    Lab Results  Component Value Date   LABMICR <3.0 11/24/2022   WBCUA None seen 05/30/2019   LABEPIT None seen 05/30/2019   BACTERIA None seen 05/30/2019    Pertinent Imaging: N.a   Assessment & Plan:    1) PSA elevation-we discussed etiology of elevated PSA including benign and malignant.  His PSA velocity and PSA density all appear to be stable and normal.  We went over the nature risk benefits and alternatives to prostate MRI scan. He wants to continue surveillance . I suggested repeat in 3 months and he wants to do 6 months  which is reasonable.   BPH-discussed management with surveillance, alpha-blocker, 5 ARI, PDE 5 inhibitors, OAB meds or procedures. Used the video board.   No follow-ups on file.  Donnice Brooks, MD  [facility]   [address] [place], [place] [address] [phone]      [1]  Allergies Allergen Reactions   Penicillins Nausea And Vomiting and Swelling    High fever Has patient had a PCN reaction causing immediate rash, facial/tongue/throat swelling, SOB or lightheadedness with hypotension: No Has patient had a PCN reaction causing severe rash involving mucus membranes or skin necrosis: No Has patient had a PCN reaction that required hospitalization: No Has patient  had a PCN reaction occurring within the last 10 years: No If all of the above answers are NO, then may proceed with Cephalosporin use.    Procaine Hcl Swelling    Novocaine   Levaquin  [Levofloxacin ] Other (See Comments)    Caused pain in tendons   Minocycline  Other (See Comments)    Severe headache   Tetracyclines & Related     Headache    "

## 2024-01-08 ENCOUNTER — Other Ambulatory Visit: Payer: Self-pay

## 2024-01-08 DIAGNOSIS — R972 Elevated prostate specific antigen [PSA]: Secondary | ICD-10-CM

## 2024-01-08 DIAGNOSIS — N400 Enlarged prostate without lower urinary tract symptoms: Secondary | ICD-10-CM

## 2024-01-08 LAB — URINALYSIS, ROUTINE W REFLEX MICROSCOPIC
Bilirubin, UA: NEGATIVE
Ketones, UA: NEGATIVE
Leukocytes,UA: NEGATIVE
Nitrite, UA: NEGATIVE
Protein,UA: NEGATIVE
RBC, UA: NEGATIVE
Specific Gravity, UA: 1.025 (ref 1.005–1.030)
Urobilinogen, Ur: 1 mg/dL (ref 0.2–1.0)
pH, UA: 6 (ref 5.0–7.5)

## 2024-01-11 ENCOUNTER — Ambulatory Visit: Admitting: Urology

## 2024-03-13 ENCOUNTER — Ambulatory Visit: Admitting: Family Medicine

## 2024-07-14 ENCOUNTER — Ambulatory Visit: Admitting: Urology
